# Patient Record
Sex: Female | Born: 1945
Health system: Southern US, Community
[De-identification: ages and names within clinical notes are randomized; demographics above are authoritative.]

## PROBLEM LIST (undated history)

## (undated) DIAGNOSIS — G971 Other reaction to spinal and lumbar puncture: Secondary | ICD-10-CM

## (undated) DIAGNOSIS — Z860100 Personal history of colon polyps, unspecified: Secondary | ICD-10-CM

## (undated) DIAGNOSIS — Z87442 Personal history of urinary calculi: Secondary | ICD-10-CM

## (undated) DIAGNOSIS — M1990 Unspecified osteoarthritis, unspecified site: Secondary | ICD-10-CM

## (undated) DIAGNOSIS — I639 Cerebral infarction, unspecified: Secondary | ICD-10-CM

## (undated) DIAGNOSIS — C50919 Malignant neoplasm of unspecified site of unspecified female breast: Secondary | ICD-10-CM

## (undated) DIAGNOSIS — I10 Essential (primary) hypertension: Secondary | ICD-10-CM

## (undated) DIAGNOSIS — D126 Benign neoplasm of colon, unspecified: Secondary | ICD-10-CM

## (undated) DIAGNOSIS — D649 Anemia, unspecified: Secondary | ICD-10-CM

## (undated) DIAGNOSIS — C182 Malignant neoplasm of ascending colon: Secondary | ICD-10-CM

## (undated) HISTORY — DX: Malignant neoplasm of ascending colon: C18.2

## (undated) HISTORY — DX: Personal history of urinary calculi: Z87.442

## (undated) HISTORY — DX: Cerebral infarction, unspecified: I63.9

## (undated) HISTORY — DX: Personal history of colon polyps, unspecified: Z86.0100

## (undated) HISTORY — DX: Unspecified osteoarthritis, unspecified site: M19.90

## (undated) HISTORY — DX: Benign neoplasm of colon, unspecified: D12.6

## (undated) HISTORY — DX: Malignant neoplasm of unspecified site of unspecified female breast: C50.919

## (undated) HISTORY — DX: Essential (primary) hypertension: I10

## (undated) HISTORY — PX: BREAST BIOPSY: SHX20

## (undated) HISTORY — DX: Personal history of colonic polyps: Z86.010

---

## 1978-11-30 HISTORY — PX: OVARIAN CYST REMOVAL: SHX89

## 1993-11-30 DIAGNOSIS — G971 Other reaction to spinal and lumbar puncture: Secondary | ICD-10-CM

## 1993-11-30 HISTORY — DX: Other reaction to spinal and lumbar puncture: G97.1

## 1993-11-30 HISTORY — PX: APPENDECTOMY: SHX54

## 2003-12-01 HISTORY — PX: HEEL SPUR EXCISION: SHX1733

## 2005-11-30 HISTORY — PX: PARTIAL COLECTOMY: SHX5273

## 2006-01-17 ENCOUNTER — Emergency Department: Payer: Self-pay | Admitting: Emergency Medicine

## 2006-04-22 ENCOUNTER — Ambulatory Visit: Payer: Self-pay | Admitting: Physician Assistant

## 2006-09-30 DIAGNOSIS — C182 Malignant neoplasm of ascending colon: Secondary | ICD-10-CM | POA: Insufficient documentation

## 2006-09-30 DIAGNOSIS — Z85038 Personal history of other malignant neoplasm of large intestine: Secondary | ICD-10-CM

## 2006-09-30 HISTORY — DX: Malignant neoplasm of ascending colon: C18.2

## 2006-10-03 ENCOUNTER — Inpatient Hospital Stay: Payer: Self-pay | Admitting: General Surgery

## 2006-10-06 ENCOUNTER — Other Ambulatory Visit: Payer: Self-pay

## 2006-11-08 ENCOUNTER — Ambulatory Visit: Payer: Self-pay | Admitting: Internal Medicine

## 2007-04-07 ENCOUNTER — Ambulatory Visit: Payer: Self-pay | Admitting: Family Medicine

## 2007-09-16 ENCOUNTER — Ambulatory Visit: Payer: Self-pay | Admitting: General Surgery

## 2007-09-23 IMAGING — CT CT ABD-PELV W/ CM
1 of 3 series · 14 of 32 positions shown, 19 images · non-contrast
Comparison: none

REASON FOR EXAM: (1) post op ileus; (2) same. IV CONTRAST ONLY.
COMMENTS:

PROCEDURE:     CT  - CT ABDOMEN / PELVIS  W  - October 15, 2006  [DATE]
RESULT:
REASON FOR CONSULTATION: Postoperative ileus for two weeks.
TECHNIQUE: Axial images were obtained from the hemidiaphragm to the pubic
symphysis post intravenous injection of contrast material.

[Series 2: abdomen · axial · 0.89mm/px · z∈[-494,-30]mm · 14 of 105 slices shown, 19 images]
[im 6/105  soft-tissue]
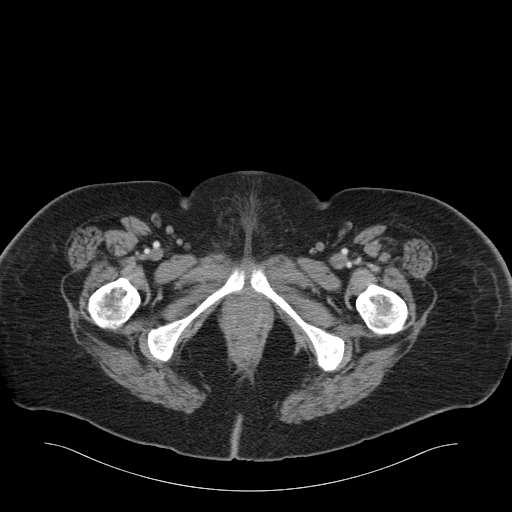
[im 6/105  bone]
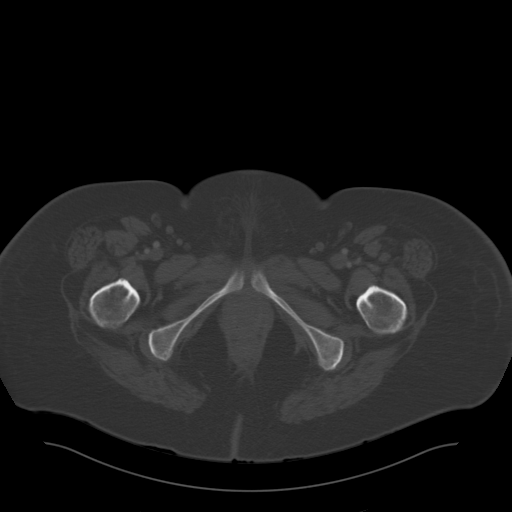
[im 17/105  soft-tissue]
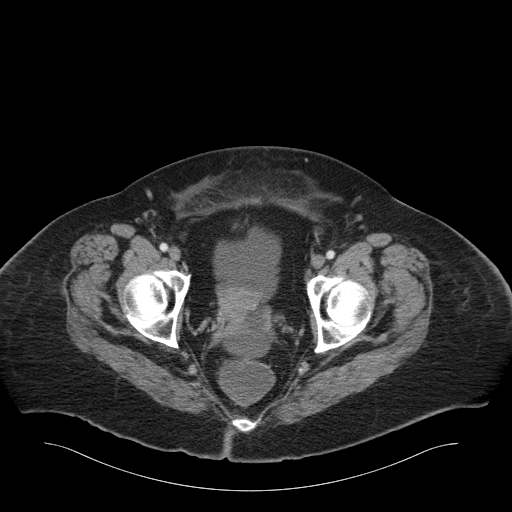
[im 22/105  soft-tissue]
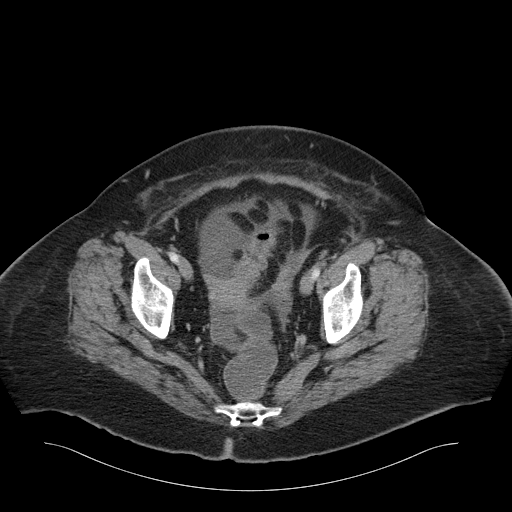
[im 28/105  soft-tissue]
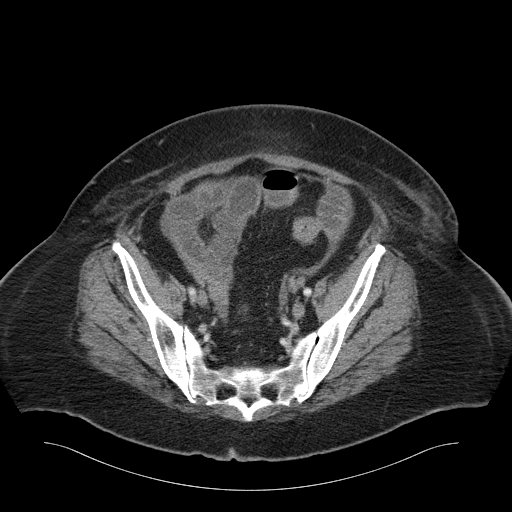
[im 39/105  soft-tissue]
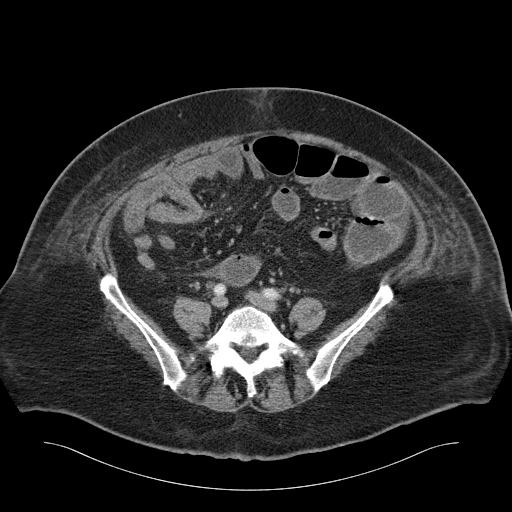
[im 44/105  soft-tissue]
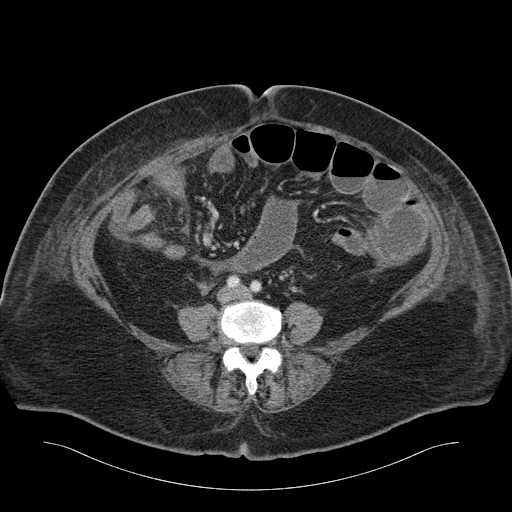
[im 55/105  soft-tissue]
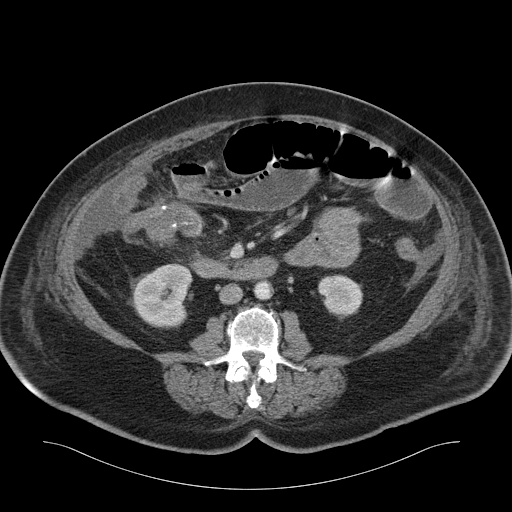
[im 61/105  soft-tissue]
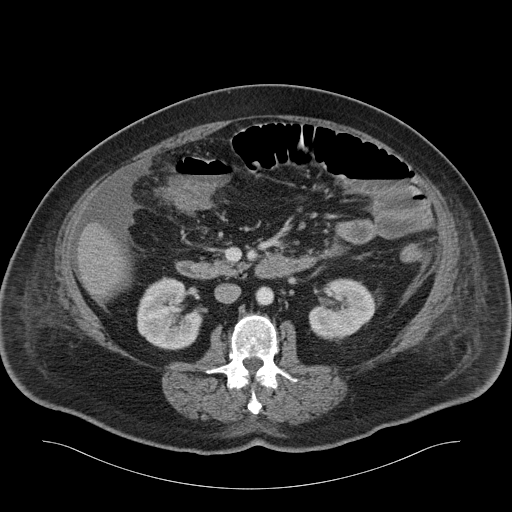
[im 66/105  soft-tissue]
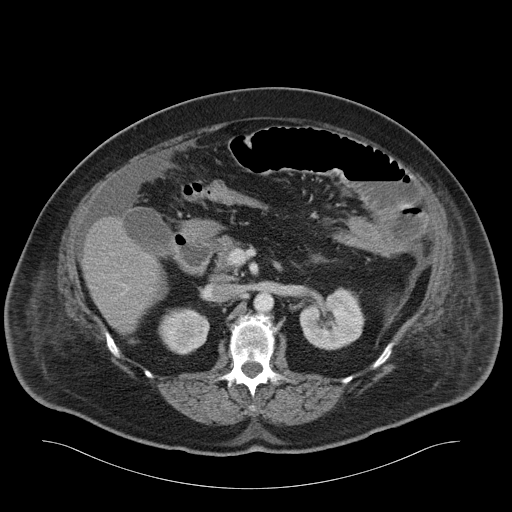
[im 66/105  bone]
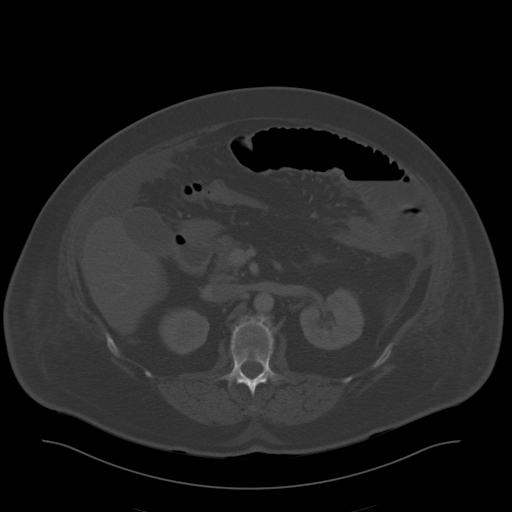
[im 77/105  soft-tissue]
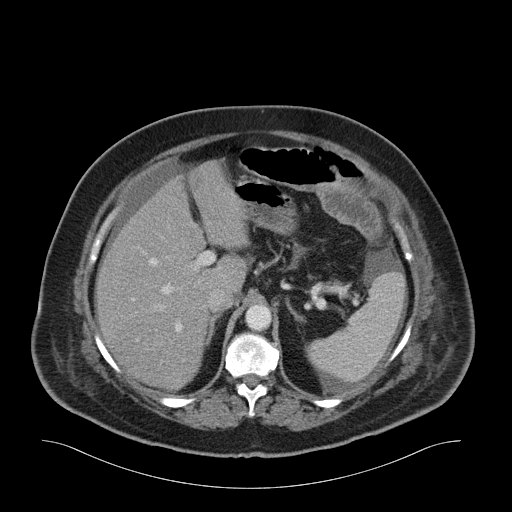
[im 83/105  soft-tissue]
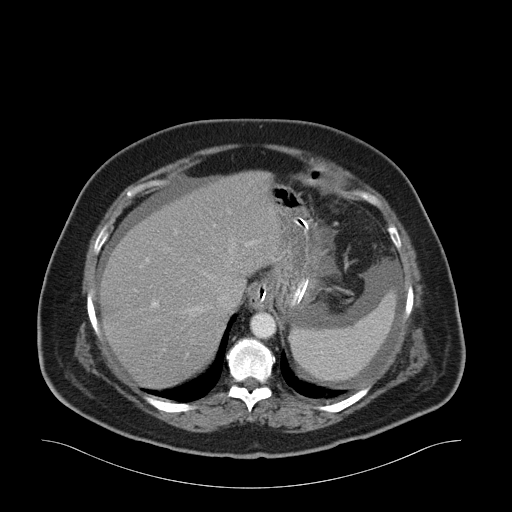
[im 83/105  lung]
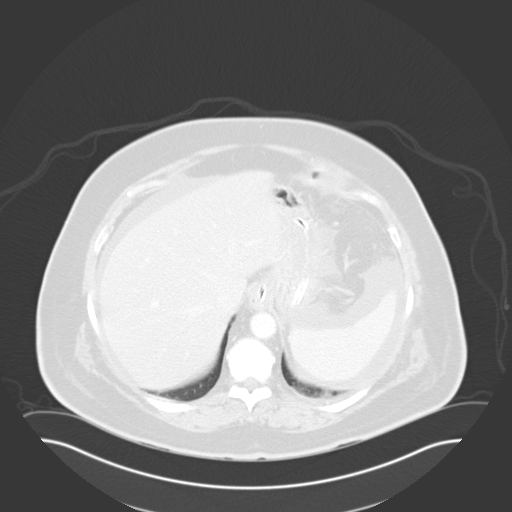
[im 88/105  soft-tissue]
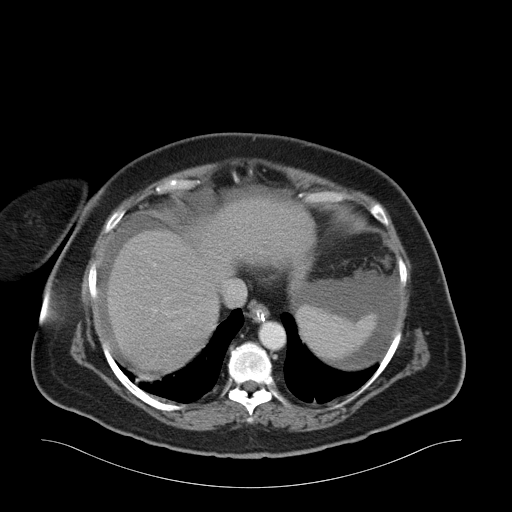
[im 88/105  lung]
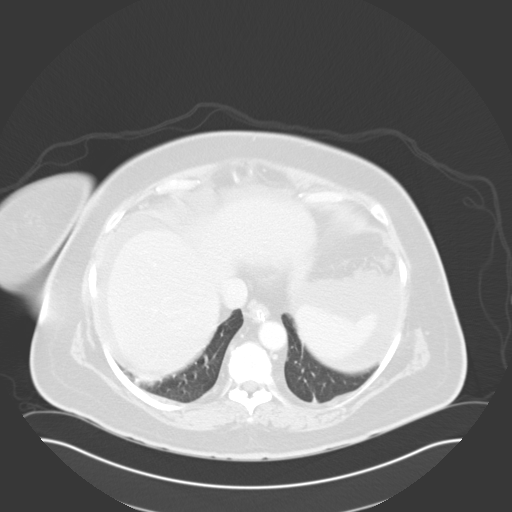
[im 94/105  lung]
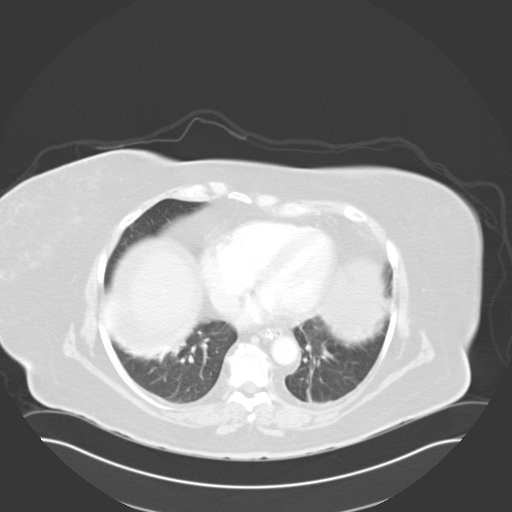
[im 99/105  soft-tissue]
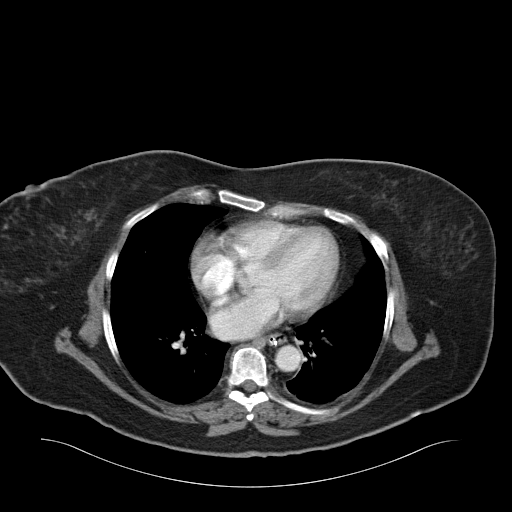
[im 99/105  lung]
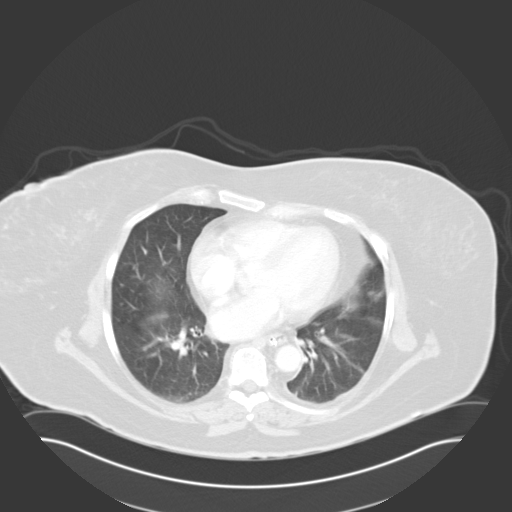

[14 of 32 positions shown; findings below may reference images not displayed]

FINDINGS: The liver and spleen appear intact. No focal masses are noted.
There is noted a small amount of ascites present about the liver and spleen
and colonic gutters. There is also noted some free fluid in the cul-de-sac.
Both kidneys are noted to excrete the contrast material.  There are noted
dilated loops of small bowel containing air and mostly fluid.  The colon is
decompressed and distally there is noted fluid in the sigmoid rectum region.
 A few of the distal small bowel loops do not appear distended.  There is
distention near the operative site and upper abdomen.  The possibility of
small bowel obstruction would be a consideration.
IMPRESSION: 1)Multiple dilated loops of small bowel.  There are a few loops distally,
which appear decompressed and there is dilatation proximal to the surgical
site. The possibility of small bowel obstruction would be a consideration.
There is noted ascites present.  No major organ abnormalities are
identified.

## 2007-09-24 IMAGING — CR DG ABDOMEN 2V
1 series · 4 of 4 positions shown · non-contrast
Comparison: none

REASON FOR EXAM: Follow up SBO.
COMMENTS:

[Series 1: view not recorded · 0.17mm/px · 4 of 4 slices shown]
[im 1/4]
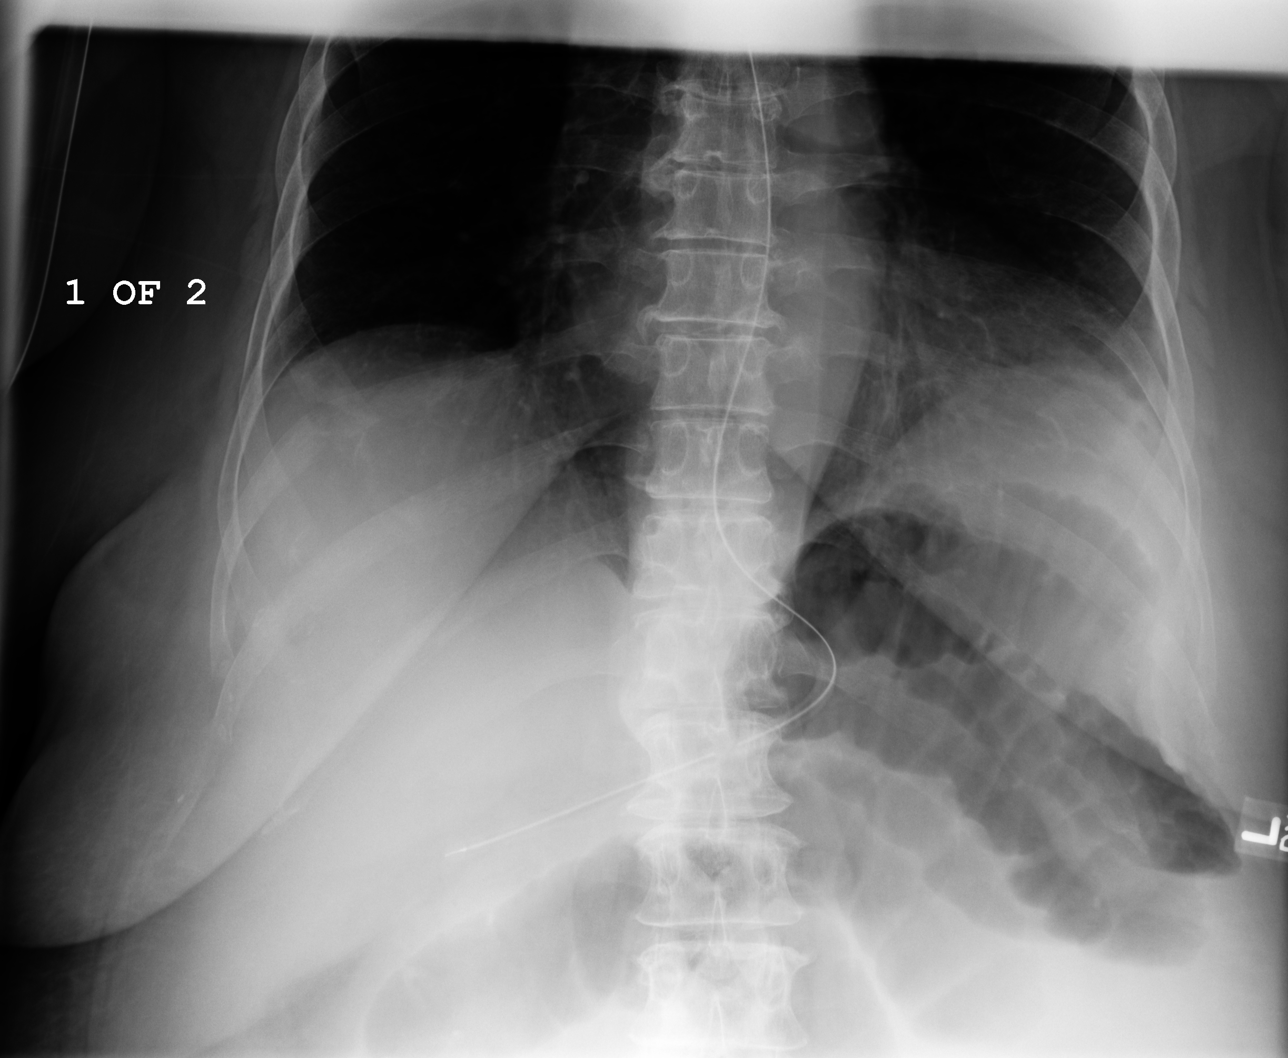
[im 2/4]
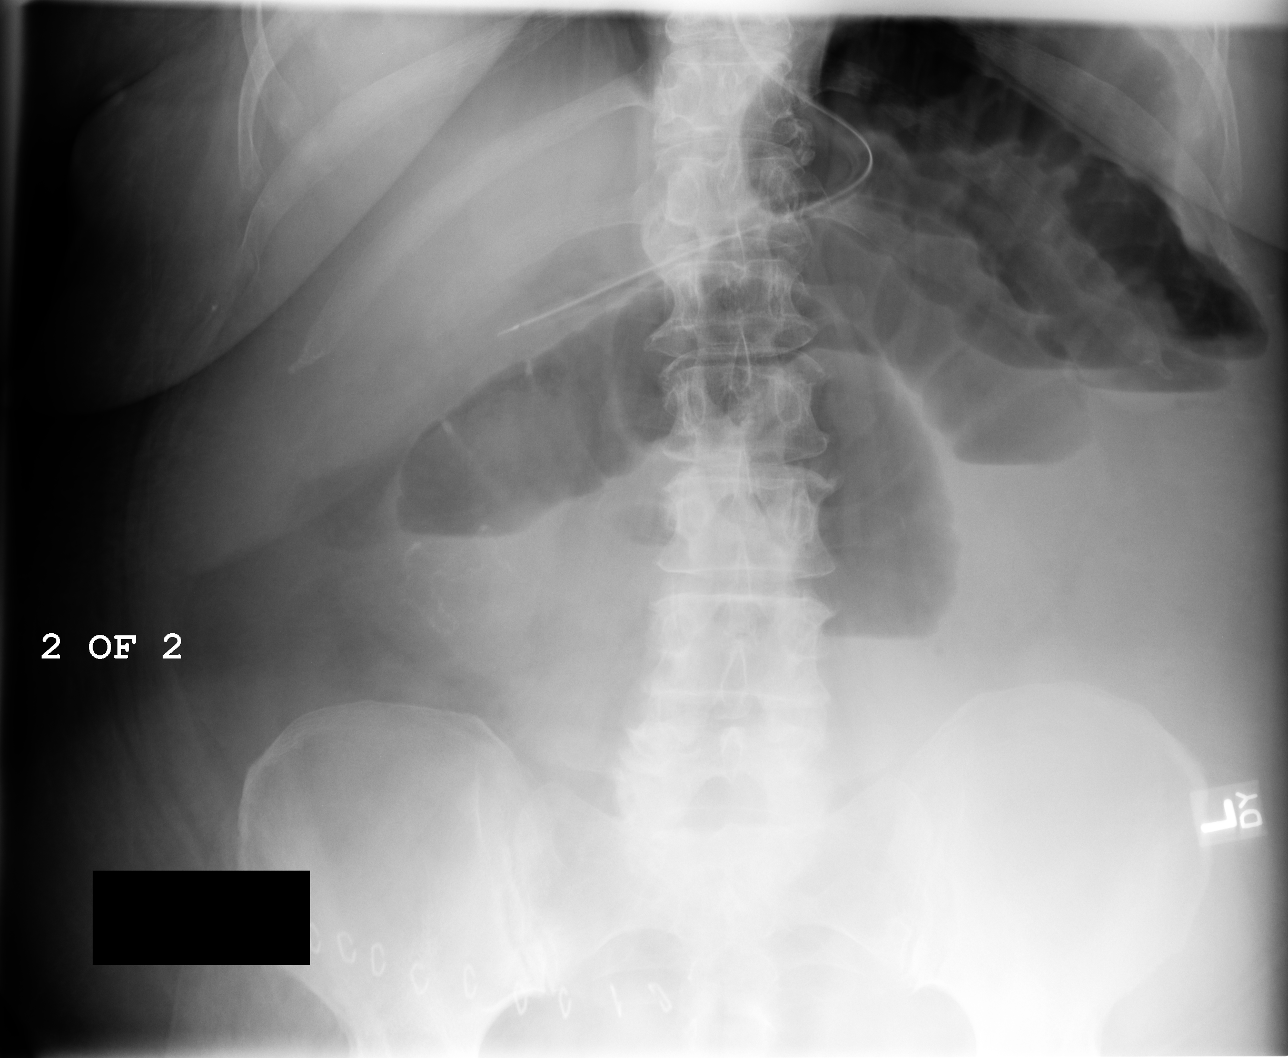
[im 3/4]
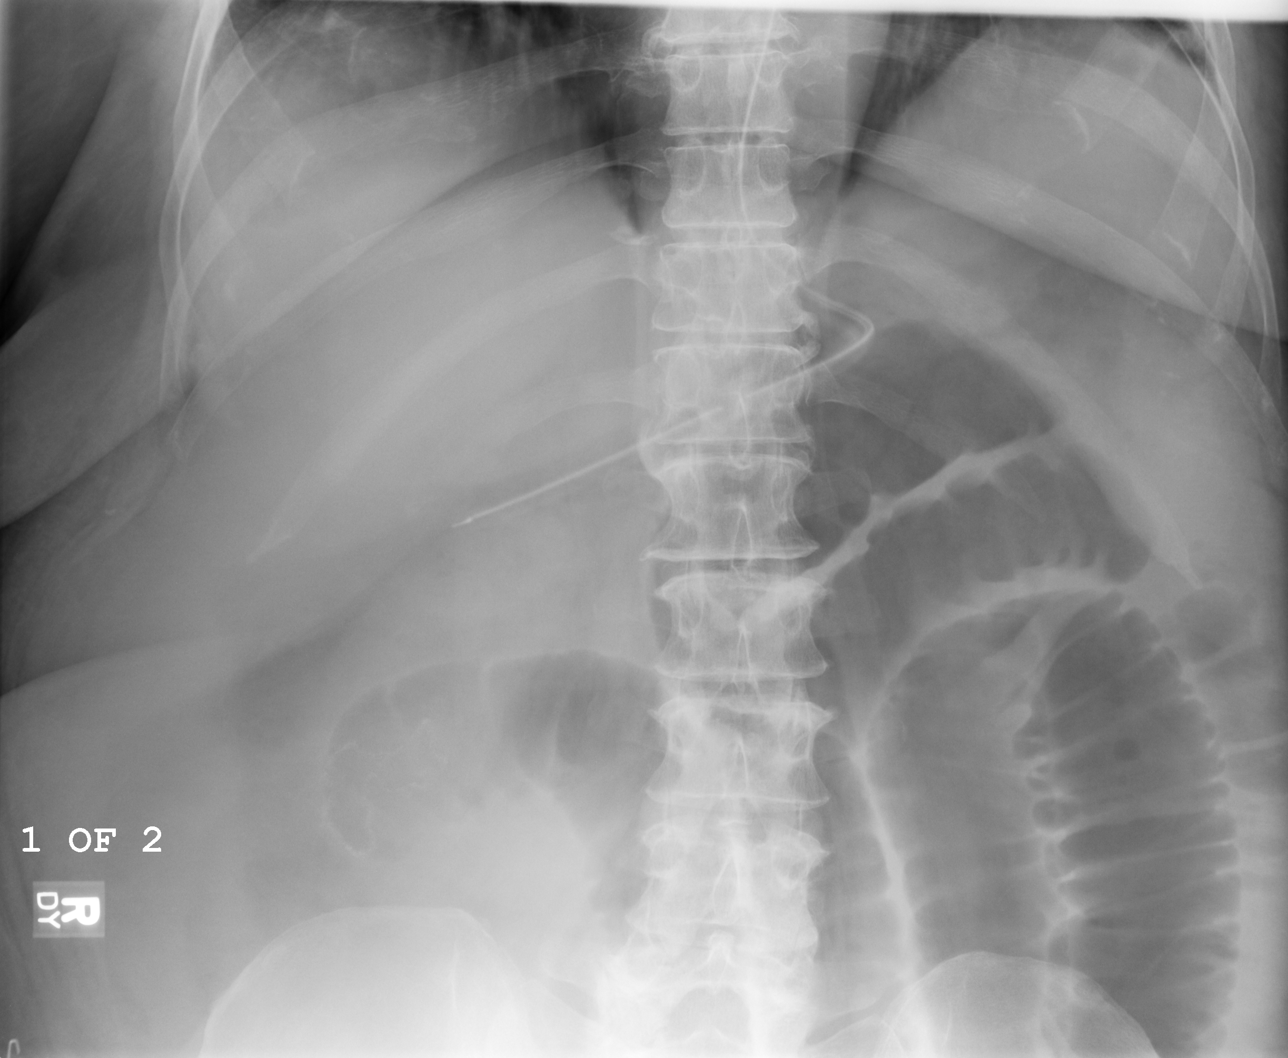
[im 4/4]
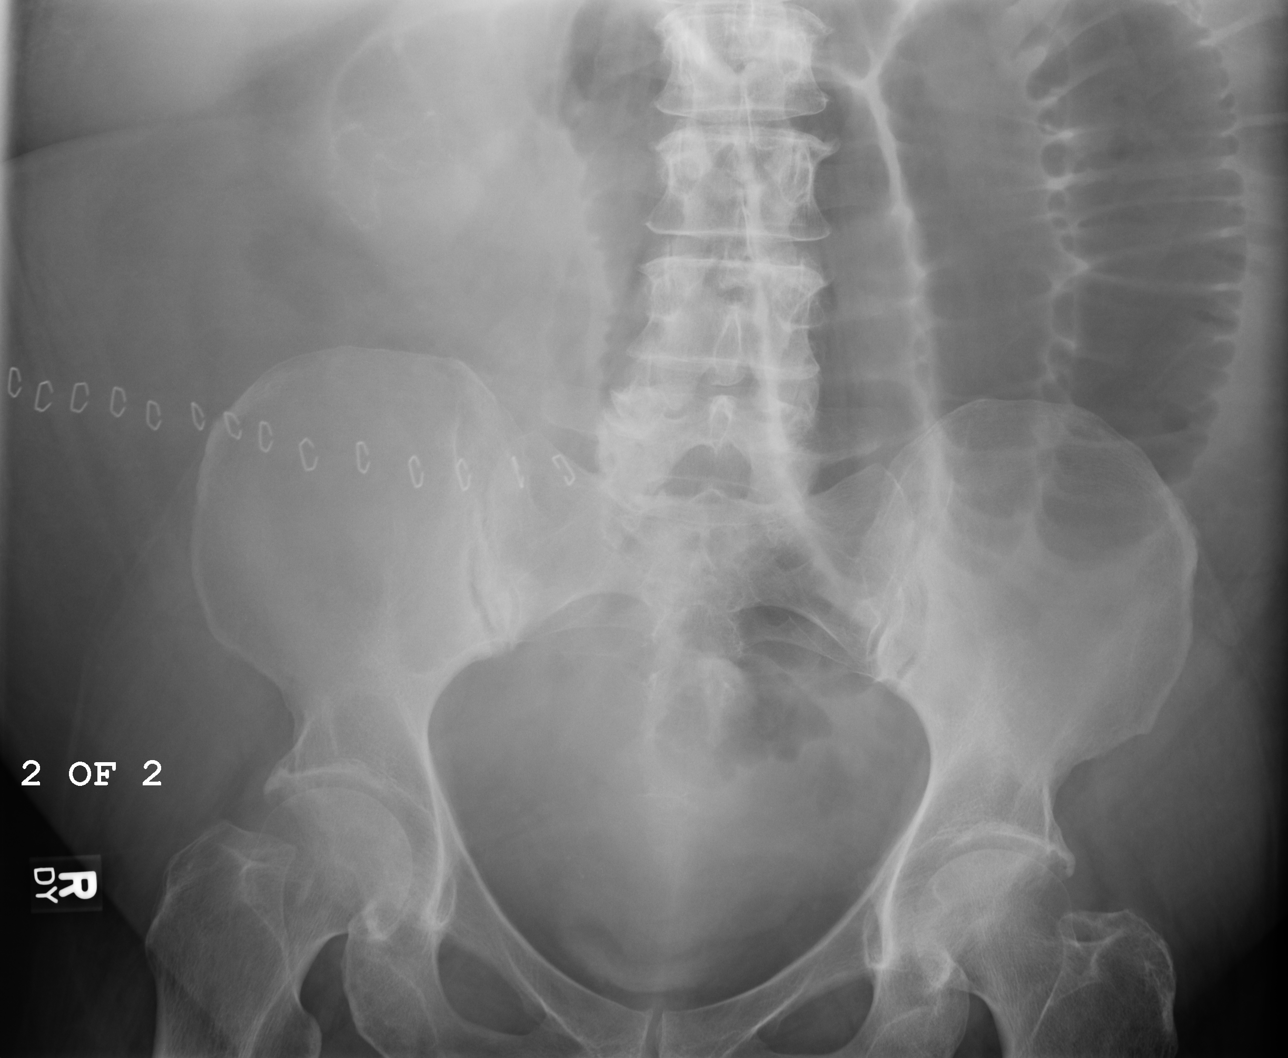

[4 of 4 positions shown; findings below may reference images not displayed]

PROCEDURE:     DXR - DXR ABDOMEN 2 V FLAT AND ERECT  - October 16, 2006  [DATE]

RESULT:        NG tube is noted in the stomach.  Dilated loops of small
bowel are noted. There is no free air.  There is no bowel wall thickening.
These findings are consistent with small bowel obstruction.  Surgical
staples are noted over the RIGHT lower quadrant.
IMPRESSION: Findings consistent with small bowel obstruction.  A postoperative ileus
cannot be completely excluded.  Similar findings were noted on a prior study
of 10/14/06.  There has been no improvement of the bowel dilatation.

## 2007-09-26 IMAGING — CR DG ABDOMEN 2V
1 series · 3 of 3 positions shown · non-contrast
Comparison: none

REASON FOR EXAM: f/u SBO
COMMENTS:

[Series 1: view not recorded · 0.17mm/px · 3 of 3 slices shown]
[im 1/3]
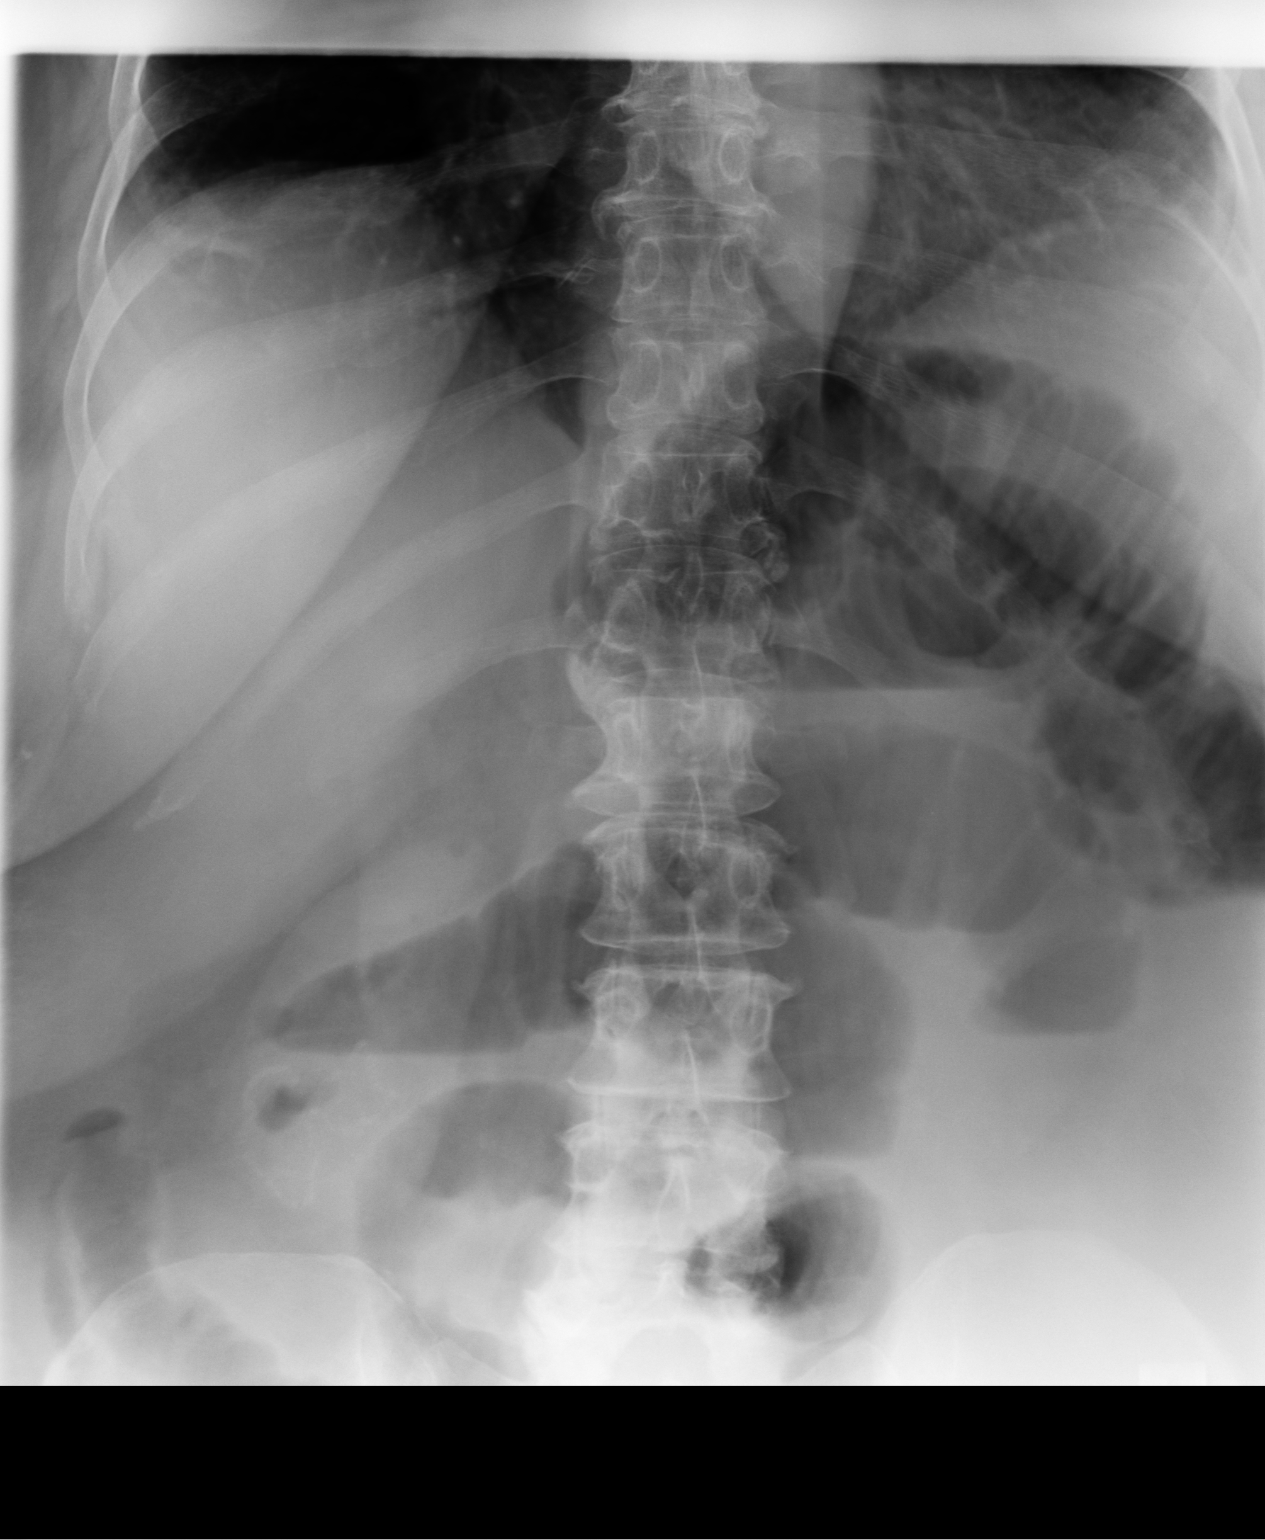
[im 2/3]
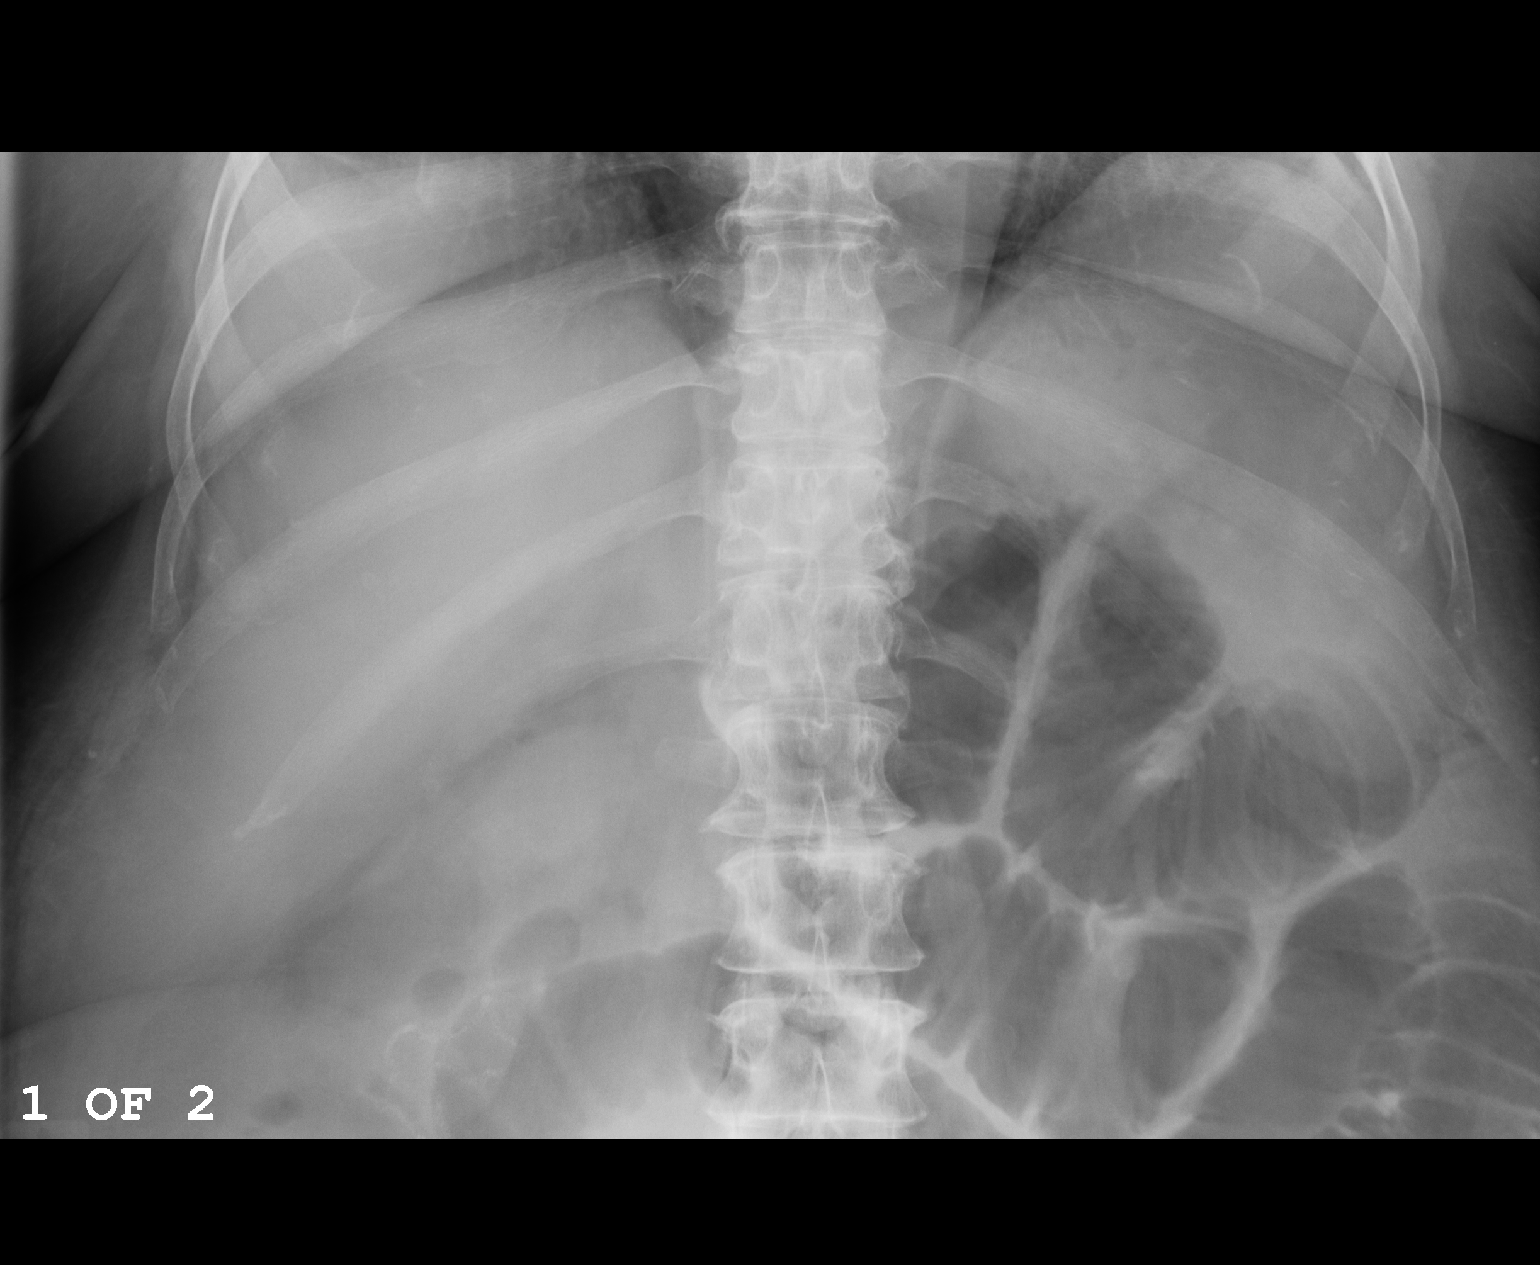
[im 3/3]
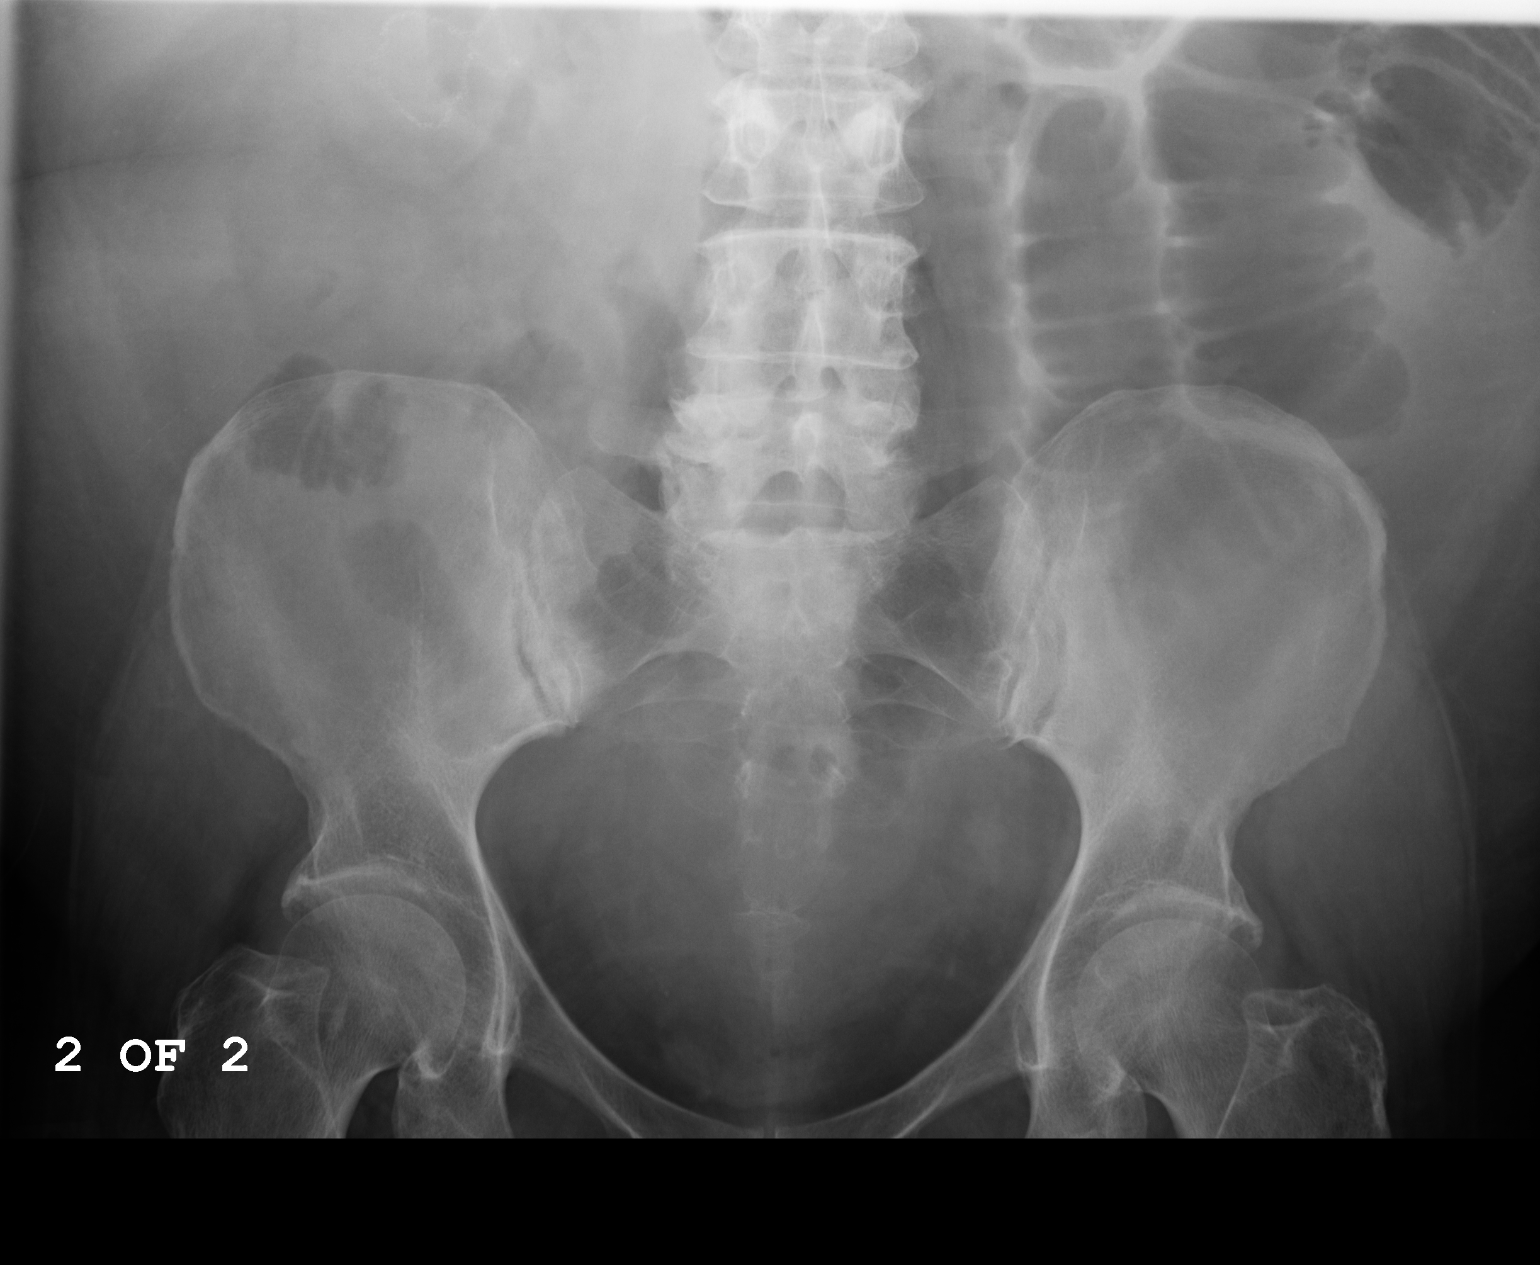

[3 of 3 positions shown; findings below may reference images not displayed]

PROCEDURE:     DXR - DXR ABDOMEN 2 V FLAT AND ERECT  - October 18, 2006  [DATE]

RESULT:     Dilated loops of small bowel are noted.  Similar findings were
noted on a prior study of 10/16/06.  On today's examination the NG has been
pulled.  The small bowel dilatation may have improved slightly from the
prior exam.  Surgical staples have been removed over the RIGHT abdomen.
IMPRESSION: Interim removal of NG tube and surgical staples.  The previously identified
small bowel dilatation remains and may be slightly improved on today's exam.
 There is a paucity of colonic gas, small bowel obstruction, as well as
adynamic ileus could present in this fashion.

## 2007-12-01 HISTORY — PX: LUMBAR DISC SURGERY: SHX700

## 2008-06-20 ENCOUNTER — Ambulatory Visit: Payer: Self-pay

## 2008-07-10 ENCOUNTER — Ambulatory Visit: Payer: Self-pay

## 2008-09-04 ENCOUNTER — Ambulatory Visit: Payer: Self-pay | Admitting: Family Medicine

## 2008-09-20 ENCOUNTER — Emergency Department: Payer: Self-pay | Admitting: Emergency Medicine

## 2008-09-25 ENCOUNTER — Ambulatory Visit: Payer: Self-pay | Admitting: Family Medicine

## 2008-09-27 ENCOUNTER — Inpatient Hospital Stay: Payer: Self-pay | Admitting: Internal Medicine

## 2009-06-26 ENCOUNTER — Ambulatory Visit: Payer: Self-pay

## 2010-07-15 ENCOUNTER — Ambulatory Visit: Payer: Self-pay

## 2010-07-18 ENCOUNTER — Ambulatory Visit: Payer: Self-pay

## 2010-08-07 HISTORY — PX: BREAST BIOPSY: SHX20

## 2010-09-17 ENCOUNTER — Inpatient Hospital Stay: Payer: Self-pay | Admitting: Internal Medicine

## 2010-09-17 DIAGNOSIS — I639 Cerebral infarction, unspecified: Secondary | ICD-10-CM

## 2010-09-17 DIAGNOSIS — Z8673 Personal history of transient ischemic attack (TIA), and cerebral infarction without residual deficits: Secondary | ICD-10-CM | POA: Insufficient documentation

## 2010-09-17 HISTORY — PX: DOPPLER ECHOCARDIOGRAPHY: SHX263

## 2010-09-17 HISTORY — DX: Cerebral infarction, unspecified: I63.9

## 2010-11-30 HISTORY — PX: JOINT REPLACEMENT: SHX530

## 2010-12-31 ENCOUNTER — Ambulatory Visit: Payer: Self-pay | Admitting: Unknown Physician Specialty

## 2010-12-31 HISTORY — PX: REPLACEMENT TOTAL KNEE: SUR1224

## 2011-01-06 ENCOUNTER — Inpatient Hospital Stay: Payer: Self-pay | Admitting: Unknown Physician Specialty

## 2011-01-29 ENCOUNTER — Observation Stay: Payer: Self-pay | Admitting: Internal Medicine

## 2011-02-14 ENCOUNTER — Emergency Department: Payer: Self-pay | Admitting: Emergency Medicine

## 2012-05-23 ENCOUNTER — Ambulatory Visit: Payer: Self-pay | Admitting: Family Medicine

## 2012-08-03 ENCOUNTER — Ambulatory Visit: Payer: Self-pay | Admitting: General Surgery

## 2012-08-03 HISTORY — PX: COLONOSCOPY: SHX174

## 2012-08-03 LAB — HM COLONOSCOPY

## 2012-11-30 DIAGNOSIS — C50919 Malignant neoplasm of unspecified site of unspecified female breast: Secondary | ICD-10-CM

## 2012-11-30 HISTORY — DX: Malignant neoplasm of unspecified site of unspecified female breast: C50.919

## 2012-12-06 ENCOUNTER — Ambulatory Visit: Payer: Self-pay | Admitting: General Surgery

## 2013-05-04 ENCOUNTER — Emergency Department: Payer: Self-pay | Admitting: Emergency Medicine

## 2013-05-05 LAB — CBC
HCT: 43.2 % (ref 35.0–47.0)
HGB: 14.8 g/dL (ref 12.0–16.0)
MCH: 29.9 pg (ref 26.0–34.0)
MCHC: 34.3 g/dL (ref 32.0–36.0)
MCV: 87 fL (ref 80–100)
Platelet: 175 10*3/uL (ref 150–440)
WBC: 13.8 10*3/uL — ABNORMAL HIGH (ref 3.6–11.0)

## 2013-05-05 LAB — URINALYSIS, COMPLETE
Bilirubin,UR: NEGATIVE
Blood: NEGATIVE
Leukocyte Esterase: NEGATIVE
Nitrite: NEGATIVE
WBC UR: 3 /HPF (ref 0–5)

## 2013-05-05 LAB — COMPREHENSIVE METABOLIC PANEL
Albumin: 3.8 g/dL (ref 3.4–5.0)
Alkaline Phosphatase: 132 U/L (ref 50–136)
BUN: 15 mg/dL (ref 7–18)
Chloride: 106 mmol/L (ref 98–107)
Co2: 27 mmol/L (ref 21–32)
EGFR (African American): 59 — ABNORMAL LOW
EGFR (Non-African Amer.): 51 — ABNORMAL LOW
Glucose: 160 mg/dL — ABNORMAL HIGH (ref 65–99)
Osmolality: 287 (ref 275–301)
Potassium: 3.3 mmol/L — ABNORMAL LOW (ref 3.5–5.1)
SGOT(AST): 28 U/L (ref 15–37)
SGPT (ALT): 21 U/L (ref 12–78)
Sodium: 142 mmol/L (ref 136–145)

## 2013-05-05 LAB — LIPASE, BLOOD: Lipase: 127 U/L (ref 73–393)

## 2013-06-09 ENCOUNTER — Encounter: Payer: Self-pay | Admitting: *Deleted

## 2013-06-26 ENCOUNTER — Ambulatory Visit: Payer: Self-pay | Admitting: Family Medicine

## 2013-07-06 ENCOUNTER — Ambulatory Visit: Payer: Self-pay | Admitting: Family Medicine

## 2013-07-10 ENCOUNTER — Encounter: Payer: Self-pay | Admitting: *Deleted

## 2013-07-26 ENCOUNTER — Ambulatory Visit (INDEPENDENT_AMBULATORY_CARE_PROVIDER_SITE_OTHER): Payer: Medicare Other | Admitting: General Surgery

## 2013-07-26 ENCOUNTER — Encounter: Payer: Self-pay | Admitting: General Surgery

## 2013-07-26 VITALS — BP 120/82 | HR 70 | Resp 14 | Ht 66.0 in | Wt 228.0 lb

## 2013-07-26 DIAGNOSIS — R920 Mammographic microcalcification found on diagnostic imaging of breast: Secondary | ICD-10-CM

## 2013-07-26 NOTE — Patient Instructions (Addendum)
Patient to be scheduled for a left breast stereotactic biopsy.   Breast Biopsy, Stereotactic A stereotactic breast biopsy takes a tissue sample from the breast with a special instrument. This is done when:  The problem (lump, abnormality, mass) can be seen on X-ray, but not felt on physical exam.  Suspicious, small calcium deposits (calcifications) are seen in the breast.  There is a change in shape or appearance of the breast, thickening, or asymmetry on mammogram (breast X-ray).  You have nipple changes (unusual or bloody discharge, crusting, retraction, dimpling).  Your caregiver is making a surgical diagnosis. The biopsy may be done on a special table, with your face down and your breasts placed through openings in the table. Computerized imaging (special form of X-rays) is used. The images are not obtained using regular X-ray film. So, exposure to radiation is reduced. Images are seen through several different angles. The surgeon removes small pieces of the suspicious tissue through a hollow needle. The tissue will be sent to the lab for analysis. The surgeon can look at the pictures right away, rather than wait for an X-ray to be developed. Your caregiver can mark the lesions (abnormal tissue formations) electronically. Then the computer can tell exactly where the problem is, or if it has moved. BENEFITS OF THE PROCEDURE  This is a good way to see if tiny lumps, abnormal looking tissue, or calcium deposits that you cannot feel are cancerous or require further treatment or follow-up.  Needle biopsy is a simple procedure. It may be performed in an outpatient imaging center. This means you have the procedure and go home the same day, without checking into a hospital.  It is less painful than open surgery. The results are as accurate as when a tissue sample is removed surgically.  The procedure is faster, less expensive, less invasive, does not distort the breast, and leaves little or no  scar.  Breast defects, which can make future mammograms hard to read and interpret, do not remain.  Recovery time is brief. Patients can soon resume their normal activities.  Using VAD (vacuum assisted device) may make it possible to remove entire lesions.  A breast biopsy can indicate if you need surgery, other treatment, or combined treatment. LET YOUR CAREGIVER KNOW ABOUT:  Allergies.  Medications taken, including herbs, eye drops, over-the-counter medications, and creams.  Use of steroids (by mouth or creams).  Previous problems with anesthetics or numbing medication.  If you are taking blood thinner medications or aspirin.  Possibility of pregnancy, if this applies.  History of blood clots (thrombophlebitis).  History of bleeding or blood problems.  Previous surgery.  Other health problems. RISKS AND COMPLICATIONS  Infection (germ growing in the wound). This can often be treated with antibiotics.  Bleeding, following surgery. Your surgeon takes every precaution to keep this from happening.  There is some concern that if a cancerous mass is present, cancer cells might be spread by the needle. Whether this actually happens is not known. It does not appear to be a significant risk.  X-ray guided breast biopsy is not infallible (not always correct). The problem may be missed or the extent of the problem may be underestimated. This would mean the biopsy did not manage to remove a piece of the diseased tissue or enough of the diseased tissue.  Lesions present, with calcium deposits scattered throughout the breast, are difficult to target by stereotactic method. Those lesions near the chest wall also are hard to learn about by this  method. If the mammogram shows only a vague change in tissue density, but no definite mass or nodule, the X-ray guided method may not be successful. Occasionally, even after a successful biopsy, the tissue diagnosis remains uncertain. A surgical  biopsy will be needed, if abnormal or precancerous cells are found on core biopsy.  Altering or deforming of the breast.  Unable to find, or missing the lesion.  Rarely, the needle may go through the chest wall into the lung area. TWO BIOPSY INSTRUMENTS MAY BE USED IN THE PROCEDURE The conventional biopsy device (core needle biopsy device) consists of an inner needle with a trough extending from it at one end, and an overlying sheath. It is attached to a spring-loaded mechanism that propels it forward. The trough fills with tissue. The outer sheath instantly moves forward to cut the tissue and keep it in the trough. Each sample is obtained in a fraction of a second. It is necessary to withdraw the needle after each sample is taken to collect the tissue.  A newer type of instrument, the VAD (vacuum assisted device), uses vacuum pressure to pull breast tissue into a needle and remove it. The needle does not need to be withdrawn after each sampling. Another advantage is that biopsies are obtained in an orderly manner, by rotating the device. This helps make sure that the entire area of interest will be sampled. When using the automated core biopsy needle, sampling is more random.  FOR COMFORT DURING THE TEST  Relax as much as possible.  Try to follow instructions, to speed up the test.  Let your caregiver know if you are uncomfortable, anxious, or in pain. PROCEDURE  You are awake during the procedure, and you go home the same day (outpatient). A specially trained radiologist will do this procedure. First, the skin is cleansed. Then, it is injected with a local anesthetic. A small nick is made in the skin, and the tip of the biopsy needle is put into the calculated site of the lesion. A special mammography machine uses ionizing radiation to help guide the radiologist's instrument to the site of the abnormal growth. At this point, stereo images are again obtained, to confirm that the needle tip is at  the problem area. Usually 5 to 10 samples are collected when doing a core biopsy. At least 12 are collected when using the vacuum assisted device (VAD). Then, a final set of images is obtained. If they show that the lesion has been mostly or completely removed, a small clip is left at the biopsy site. This is so that it can be easily located, in case the lesion turns out to be cancer. Afterward, the skin opening is stitched (sutured) or taped closed, and covered with a dressing. Your caregiver may apply a pressure dressing and an ice pack, to prevent bleeding and swelling in the breast.  X-ray guided breast biopsy can take 30 minutes to 1 hour, or more. The X-rays usually have no side effects, and no radiation remains in your body. There is usually little or no pain. Usually no scar is left from the tiny skin incision. Many women find that the major discomfort of the procedure is from lying on their stomach, or staying in 1 position for the length of the procedure. This discomfort may be reduced by carefully placed cushions. You should wear a good support bra to the procedure. You will be asked to remove jewelry, dentures, eye glasses, metal objects, or clothing that might interfere with  the X-ray images. You may want to have someone with you, to take you home after the procedure. AFTER THE PROCEDURE   After surgery, if you are doing well and have no problems, you will be allowed to go home.  You may resume your regular diet, or as directed by your caregiver. HOME CARE INSTRUCTIONS   Follow your caregiver's recommendations for medications, care of the biopsy site, follow-up appointments, and further treatment.  Only take over-the-counter or prescription medicines for pain, discomfort, or fever as directed by your caregiver.  An ice pack applied to the affected area may help with discomfort and keep the swelling down.  Change dressings as directed.  Wear a good support bra for as long as your  caregiver recommends.  Avoid strenuous activity for at least 24 hours, or as advised by your caregiver. Finding out the results of your test Not all test results are available during your visit. If your test results are not back during the visit, make an appointment with your caregiver to find out the results. Do not assume everything is normal if you have not heard from your caregiver or the medical facility. It is important for you to follow up on all of your test results.  SEEK MEDICAL CARE IF:   You develop a rash.  You have problems with your medicines.  You become lightheaded or dizzy. SEEK IMMEDIATE MEDICAL CARE IF:   There is increased bleeding (more than a small spot) from the biopsy site.  You notice redness, swelling, or increasing pain in the wound.  Pus is coming from the wound.  You have a fever.  You notice a bad smell coming from the wound or dressing.  You develop shortness of breath.  You develop chest pain.  You pass out. Document Released: 08/15/2003 Document Revised: 02/08/2012 Document Reviewed: 09/20/2009 [facility] Patient Information 2014 [place], [place].  Patient has been scheduled for a left stereotactic biopsy x 2 at [facility] for 08-14-13 at 3 pm. She will check-in at the [facility] at 2:30 pm. This patient is aware of date, time, and instructions. Patient verbalizes understanding.

## 2013-07-26 NOTE — Progress Notes (Signed)
Patient ID: Lindsay Russo, female   DOB: June 11, 1946, 67 y.o.   MRN: [id number]  Chief Complaint  Patient presents with  . Other    mammogram    HPI Lindsay Russo is a 67 y.o. female who presents for a breast evaluation. The most recent mammogram was done on 06/26/13 with a birad category 0. Additional views of the left breast were done on 07/06/13 with a birad category 4. The patient performs self breast checks regularly and gets regular mammograms done. The patient denies any problems with the breasts at this time. No known family history of breast problems.   The patient previously underwent biopsy of a sclerotic right breast fibroadenoma 2011.   HPI  Past Medical History  Diagnosis Date  . Hypertension   . Stroke 2011  . Arthritis   . Malignant neoplasm of ascending colon   . Renal failure   . Personal history of colonic polyps     Past Surgical History  Procedure Laterality Date  . Appendectomy  1995  . Ovarian cyst removal  1980  . Colon surgery  2007  . Colonoscopy  2008  . Replacement total knee  2012  . Breast biopsy Right 2011    pathology was negative    Family History  Problem Relation Age of Onset  . Gastric cancer Father     Social History History  Substance Use Topics  . Smoking status: Never Smoker   . Smokeless tobacco: Never Used  . Alcohol Use: No    Allergies  Allergen Reactions  . Amoxicillin Rash  . Sulfa Antibiotics Rash    Current Outpatient Prescriptions  Medication Sig Dispense Refill  . amLODipine (NORVASC) 10 MG tablet Take 1 tablet by mouth daily.      [name] aspirin 81 MG tablet Take 81 mg by mouth daily.      . pravastatin (PRAVACHOL) 80 MG tablet Take 1 tablet by mouth.       No current facility-administered medications for this visit.    Review of Systems Review of Systems  Constitutional: Negative.   Respiratory: Negative.   Cardiovascular: Negative.     Blood pressure 120/82, pulse 70, resp. rate 14, height 5\' 6"  (1.676  m), weight 228 lb (103.42 kg).  Physical Exam Physical Exam  Constitutional: She is oriented to person, place, and time. She appears well-developed and well-nourished.  Neck: No thyromegaly present.  Cardiovascular: Normal rate, regular rhythm and normal heart sounds.   No murmur heard. Pulmonary/Chest: Effort normal and breath sounds normal. Right breast exhibits no inverted nipple, no mass, no nipple discharge, no skin change and no tenderness. Left breast exhibits no inverted nipple, no mass, no nipple discharge, no skin change and no tenderness.  Right breast 2 cup sizes larger than the left.   Lymphadenopathy:    She has no cervical adenopathy.    She has no axillary adenopathy.  Neurological: She is alert and oriented to person, place, and time.  Skin: Skin is warm and dry.    Data Reviewed Bilateral mammograms dated June 26, 2013 suggested a new area of concern in the left breast. The right breast was unremarkable. BI-RAD-0.  Focal spot compression views of the left breast in July 06, 2013 showed 2 groupings of indeterminate microcalcifications. BI-RAD-4B.  Hemoglobin A1c dated June 22, 2013: 5.5%. Recent As a metabolic panel showed a creatinine of 1.0 with an estimated GFR 57, normal electrolytes.  Assessment    New left breast microcalcifications.  Plan    Indication rebiopsy was reviewed with the patient. The dominant lesion will be targeted first, end of the procedure goes as well as anticipated, both areas will be removed.     Patient has been scheduled for a left stereotactic biopsy x 2 at [facility] for 08-14-13 at 3 pm. She will check-in at the [facility] at 2:30 pm. This patient is aware of date, time, and instructions. Patient verbalizes understanding.   Lindsay Russo 07/27/2013, 7:06 PM

## 2013-07-27 ENCOUNTER — Encounter: Payer: Self-pay | Admitting: General Surgery

## 2013-08-03 ENCOUNTER — Encounter: Payer: Self-pay | Admitting: General Surgery

## 2013-08-14 ENCOUNTER — Ambulatory Visit: Payer: Self-pay | Admitting: General Surgery

## 2013-08-14 DIAGNOSIS — R920 Mammographic microcalcification found on diagnostic imaging of breast: Secondary | ICD-10-CM

## 2013-08-14 HISTORY — PX: BREAST BIOPSY: SHX20

## 2013-08-16 ENCOUNTER — Telehealth: Payer: Self-pay | Admitting: General Surgery

## 2013-08-16 NOTE — Telephone Encounter (Signed)
Notified of biopsy results. Small foci of DCIS. Will meet Monday, Sept 22, to review options.

## 2013-08-17 ENCOUNTER — Encounter: Payer: Self-pay | Admitting: General Surgery

## 2013-08-21 ENCOUNTER — Ambulatory Visit (INDEPENDENT_AMBULATORY_CARE_PROVIDER_SITE_OTHER): Payer: Medicare Other | Admitting: General Surgery

## 2013-08-21 VITALS — BP 144/76 | HR 76 | Resp 14 | Ht 66.0 in | Wt 228.0 lb

## 2013-08-21 DIAGNOSIS — C50919 Malignant neoplasm of unspecified site of unspecified female breast: Secondary | ICD-10-CM

## 2013-08-21 DIAGNOSIS — D0590 Unspecified type of carcinoma in situ of unspecified breast: Secondary | ICD-10-CM

## 2013-08-21 DIAGNOSIS — D0512 Intraductal carcinoma in situ of left breast: Secondary | ICD-10-CM

## 2013-08-21 NOTE — Progress Notes (Signed)
Patient ID: Lindsay Russo, female   DOB: 08-28-1946, 67 y.o.   MRN: [id number]  The patient had been contacted by phone with the results of her breast biopsies completed on 08/14/2013. The superior lesion did show an area of DCIS. She is accompanied today by her husband and 2 daughters.  Examination of head and neck is unremarkable. Chest exam is clear. Cardiac exam purulent.  Examination of the left breast showed moderate bruising of the lateral aspect.  Ultrasound examination confirmed both biopsy sites.   The superior site is at the 12:00 position measuring 0.8 x 1.7 x 1.95 cm. This is located 6 cm superior to the nipple.  4 cm from the nipple a hypoechoic area measuring 0.8 x 0.8 x 0.9 cm is identified as well.  The lateral biopsy site located at the 3:00 position 6 cm from the nipple measured 1.2 x 1.4 x 2.0 cm.  Pathology of the lateral site showed no evidence of malignancy.  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 A fibroadenoma with coarse calcifications was also appreciated. The pathologist commented that pleomorphic LCIS has behavior pattern similar to DCIS. There is an increased risk of multifocality.  Options for management were reviewed: 1) simple mastectomy versus 2) wide local excision followed by postoperative radiation therapy. At this time the patient is interested in breast conservation. In light of the findings of LCIS, arrangements were made for a bilateral breast MRI prior to surgical intervention.  Patient to be scheduled for a bilateral breast MRI at [facility]. This has been arranged for 08-25-13 at 10 am.   Surgery to be arranged after breast MRI.

## 2013-08-21 NOTE — Patient Instructions (Signed)
Patient to be scheduled for a breast MRI.

## 2013-08-22 ENCOUNTER — Encounter: Payer: Self-pay | Admitting: General Surgery

## 2013-08-22 DIAGNOSIS — D0510 Intraductal carcinoma in situ of unspecified breast: Secondary | ICD-10-CM | POA: Insufficient documentation

## 2013-08-25 ENCOUNTER — Ambulatory Visit
Admission: RE | Admit: 2013-08-25 | Discharge: 2013-08-25 | Disposition: A | Discharge status: Home / Self Care | Payer: Medicare Other | Source: Ambulatory Visit | Attending: General Surgery | Admitting: General Surgery

## 2013-08-25 DIAGNOSIS — C50919 Malignant neoplasm of unspecified site of unspecified female breast: Secondary | ICD-10-CM

## 2013-08-25 MED ORDER — GADOBENATE DIMEGLUMINE 529 MG/ML IV SOLN
20.0000 mL | Freq: Once | INTRAVENOUS | Status: AC | PRN
  Administered 2013-08-25: 20 mL via INTRAVENOUS

## 2013-08-29 ENCOUNTER — Other Ambulatory Visit: Payer: Self-pay | Admitting: General Surgery

## 2013-08-29 ENCOUNTER — Telehealth: Payer: Self-pay | Admitting: *Deleted

## 2013-08-29 DIAGNOSIS — D0512 Intraductal carcinoma in situ of left breast: Secondary | ICD-10-CM

## 2013-08-29 NOTE — Telephone Encounter (Signed)
Message copied by Nicholes Mango on Tue Aug 29, 2013 12:11 PM ------      Message from: [name]      Created: Tue Aug 29, 2013  8:14 AM       May proceed with scheduling. Notify patient no additional abnormalities appreciated on breast MRI. Thanks.      ----- Message -----         From: Wendall Stade, CMA         Sent: 08/25/2013  10:09 AM           To: [name], MD            Reminder: Patient is scheduled for breast MRI today, 08-25-13 at 10 am. Let me know when I can arrange patient's wide excision. Thanks.       ------

## 2013-08-29 NOTE — Telephone Encounter (Signed)
Patient notified as instructed. This patient's surgery has been scheduled for 09-05-13 at [facility] [place]. It is okay for patient to continue 81 mg aspirin.

## 2013-08-30 ENCOUNTER — Ambulatory Visit: Payer: Self-pay | Admitting: General Surgery

## 2013-09-05 ENCOUNTER — Ambulatory Visit: Payer: Self-pay | Admitting: General Surgery

## 2013-09-05 DIAGNOSIS — C50919 Malignant neoplasm of unspecified site of unspecified female breast: Secondary | ICD-10-CM

## 2013-09-05 DIAGNOSIS — C50219 Malignant neoplasm of upper-inner quadrant of unspecified female breast: Secondary | ICD-10-CM

## 2013-09-05 HISTORY — PX: BREAST LUMPECTOMY: SHX2

## 2013-09-05 HISTORY — PX: BREAST SURGERY: SHX581

## 2013-09-05 HISTORY — DX: Malignant neoplasm of unspecified site of unspecified female breast: C50.919

## 2013-09-06 ENCOUNTER — Encounter: Payer: Self-pay | Admitting: General Surgery

## 2013-09-07 ENCOUNTER — Telehealth: Payer: Self-pay | Admitting: General Surgery

## 2013-09-07 LAB — PATHOLOGY REPORT

## 2013-09-07 NOTE — Telephone Encounter (Signed)
The patient was notified that the wide excision specimen showed clear margins.  She reports feeling well.  Followup office exam is scheduled for 09/12/2013.

## 2013-09-11 ENCOUNTER — Encounter: Payer: Self-pay | Admitting: General Surgery

## 2013-09-12 ENCOUNTER — Encounter: Payer: Self-pay | Admitting: General Surgery

## 2013-09-12 ENCOUNTER — Ambulatory Visit (INDEPENDENT_AMBULATORY_CARE_PROVIDER_SITE_OTHER): Payer: Medicare Other | Admitting: General Surgery

## 2013-09-12 VITALS — BP 128/80 | HR 78 | Resp 14 | Ht 66.0 in | Wt 229.0 lb

## 2013-09-12 DIAGNOSIS — C50919 Malignant neoplasm of unspecified site of unspecified female breast: Secondary | ICD-10-CM

## 2013-09-12 DIAGNOSIS — D0500 Lobular carcinoma in situ of unspecified breast: Secondary | ICD-10-CM | POA: Insufficient documentation

## 2013-09-12 DIAGNOSIS — D0590 Unspecified type of carcinoma in situ of unspecified breast: Secondary | ICD-10-CM

## 2013-09-12 DIAGNOSIS — D0502 Lobular carcinoma in situ of left breast: Secondary | ICD-10-CM

## 2013-09-12 DIAGNOSIS — D0512 Intraductal carcinoma in situ of left breast: Secondary | ICD-10-CM

## 2013-09-12 NOTE — Patient Instructions (Addendum)
Patient to return in 2 weeks  

## 2013-09-12 NOTE — Progress Notes (Signed)
Patient ID: Lindsay Russo, female   DOB: 1946-11-05, 67 y.o.   MRN: [id number]  Chief Complaint  Patient presents with  . Routine Post Op    left breast mass excision    HPI Lindsay Russo is a 67 y.o. female here today for her post op left breast excsion done 09/05/13.Patient states she has a blister due to the steri-strips.  HPI  Past Medical History  Diagnosis Date  . Hypertension   . Stroke 2011  . Arthritis   . Renal failure   . Personal history of colonic polyps   . Malignant neoplasm of ascending colon 2007    T2, N0.    Past Surgical History  Procedure Laterality Date  . Appendectomy  1995  . Ovarian cyst removal  1980  . Colon surgery  2007  . Colonoscopy  August 03, 2012    8 mm tubulovillous adenoma removed from the transverse colon. Followup exam in 2018 plan.  . Replacement total knee  2012  . Breast biopsy Right August 07, 2010    ssclerotic fibroadenoma removed from the right breast at the 10:00 position  . Breast surgery Left 09/05/13    excision    Family History  Problem Relation Age of Onset  . Gastric cancer Father     Social History History  Substance Use Topics  . Smoking status: Never Smoker   . Smokeless tobacco: Never Used  . Alcohol Use: No    Allergies  Allergen Reactions  . Amoxicillin Rash  . Sulfa Antibiotics Rash    Current Outpatient Prescriptions  Medication Sig Dispense Refill  . amLODipine (NORVASC) 10 MG tablet Take 1 tablet by mouth daily.      [name] aspirin 81 MG tablet Take 81 mg by mouth daily.      . pravastatin (PRAVACHOL) 80 MG tablet Take 1 tablet by mouth.       No current facility-administered medications for this visit.    Review of Systems Review of Systems  Constitutional: Negative.   Respiratory: Negative.   Cardiovascular: Negative.     Blood pressure 128/80, pulse 78, resp. rate 14, height 5\' 6"  (1.676 m), weight 229 lb (103.874 kg).  Physical Exam Physical Exam  Constitutional: She is  oriented to person, place, and time. She appears well-developed and well-nourished.  Pulmonary/Chest:  Left breast healing well.  Minimal bruising noted.  Neurological: She is alert and oriented to person, place, and time.  Skin: Skin is warm and dry.    Data Reviewed Pathology showed LCIS, pleomorphic type with 3 mm to the superior margin. Ultrasound was completed to determine if radiation therapy was needed if she would be a candidate for partial breast radiation. This showed a minimal spacing of 1.5 cm to the skin and a 1.4 x 3.3 x 5.0 deep cavity.  Assessment    LCIS, pleomorphic type left breast.     Plan    The case was discussed by phone with radiation oncology. No indication for radiation therapy.  ER/PR testing has been requested from pathology.   Will discuss antiestrogen therapy her followup appointment.       Lindsay Russo 09/12/2013, 9:48 PM

## 2013-09-13 ENCOUNTER — Encounter: Payer: Self-pay | Admitting: General Surgery

## 2013-09-18 ENCOUNTER — Encounter: Payer: Self-pay | Admitting: General Surgery

## 2013-09-20 LAB — PATHOLOGY REPORT

## 2013-09-21 ENCOUNTER — Encounter: Payer: Self-pay | Admitting: General Surgery

## 2013-09-26 ENCOUNTER — Ambulatory Visit (INDEPENDENT_AMBULATORY_CARE_PROVIDER_SITE_OTHER): Payer: Medicare Other | Admitting: General Surgery

## 2013-09-26 ENCOUNTER — Encounter: Payer: Self-pay | Admitting: General Surgery

## 2013-09-26 VITALS — BP 152/78 | HR 78 | Resp 14 | Ht 66.0 in | Wt 228.0 lb

## 2013-09-26 DIAGNOSIS — C50919 Malignant neoplasm of unspecified site of unspecified female breast: Secondary | ICD-10-CM

## 2013-09-26 DIAGNOSIS — D0500 Lobular carcinoma in situ of unspecified breast: Secondary | ICD-10-CM | POA: Insufficient documentation

## 2013-09-26 DIAGNOSIS — D0590 Unspecified type of carcinoma in situ of unspecified breast: Secondary | ICD-10-CM

## 2013-09-26 DIAGNOSIS — D0502 Lobular carcinoma in situ of left breast: Secondary | ICD-10-CM

## 2013-09-26 HISTORY — DX: Lobular carcinoma in situ of unspecified breast: D05.00

## 2013-09-26 MED ORDER — TAMOXIFEN CITRATE 20 MG PO TABS: 20.0000 mg | ORAL_TABLET | Freq: Every day | ORAL | Status: DC

## 2013-09-26 NOTE — Progress Notes (Signed)
Patient ID: Lindsay Russo, female   DOB: Jan 24, 1946, 67 y.o.   MRN: [id number]  Chief Complaint  Patient presents with  . Follow-up    post op left breast excsion done 09/05/13    HPI Lindsay Russo is a 67 y.o. female.  Here today for her follow up.  She is post op left breast excsion done 09/05/13 and states she is doing well. No new complaints.    HPI  Past Medical History  Diagnosis Date  . Hypertension   . Stroke 2011  . Arthritis   . Renal failure   . Personal history of colonic polyps   . Malignant neoplasm of ascending colon 2007    T2, N0.    Past Surgical History  Procedure Laterality Date  . Appendectomy  1995  . Ovarian cyst removal  1980  . Colon surgery  2007  . Colonoscopy  August 03, 2012    8 mm tubulovillous adenoma removed from the transverse colon. Followup exam in 2018 plan.  . Replacement total knee  2012  . Breast biopsy Right August 07, 2010    Sclerotic fibroadenoma removed from the right breast at the 10:00 position  . Breast surgery Left 09/05/13    LCIS, ER 90%, PR 60%, negative margins.    Family History  Problem Relation Age of Onset  . Gastric cancer Father     Social History History  Substance Use Topics  . Smoking status: Never Smoker   . Smokeless tobacco: Never Used  . Alcohol Use: No    Allergies  Allergen Reactions  . Tegaderm Ag Mesh [Silver] Other (See Comments)    blisters  . Amoxicillin Rash  . Sulfa Antibiotics Rash    Current Outpatient Prescriptions  Medication Sig Dispense Refill  . amLODipine (NORVASC) 10 MG tablet Take 1 tablet by mouth daily.      [name] aspirin 81 MG tablet Take 81 mg by mouth daily.      . pravastatin (PRAVACHOL) 80 MG tablet Take 1 tablet by mouth.      . tamoxifen (NOLVADEX) 20 MG tablet Take 1 tablet (20 mg total) by mouth daily.  90 tablet  3   No current facility-administered medications for this visit.    Review of Systems Review of Systems  Constitutional: Negative.    Respiratory: Negative.   Cardiovascular: Negative.     Blood pressure 152/78, pulse 78, resp. rate 14, height 5\' 6"  (1.676 m), weight 228 lb (103.42 kg).  Physical Exam Physical Exam  Constitutional: She is oriented to person, place, and time. She appears well-developed and well-nourished.  Pulmonary/Chest:  Incision and blistered areas well healed.   Neurological: She is alert and oriented to person, place, and time.  Skin: Skin is warm and dry.    Data Reviewed LCIS of the left breast, ER 90%, PR 60%.  Assessment    Doing well status post left breast wide excision.     Plan    Indications for antiestrogen therapy were reviewed. The pros and cons of tamoxifen therapy including those related to DVT, uterine malignancy and vasomotor symptoms were discussed. The patient is amenable to a trial for 30 days. She reports her tolerance at that time.        [name] 09/26/2013, 8:01 PM

## 2013-09-26 NOTE — Patient Instructions (Addendum)
Continue self breast exams. Call office for any new breast issues or concerns. Try antihormone tablet therapy for one month and call back to let us konw if she can tolerate it or not

## 2013-11-06 ENCOUNTER — Telehealth: Payer: Self-pay | Admitting: General Surgery

## 2013-11-06 NOTE — Telephone Encounter (Signed)
11-06-13 PT CAME IN TODAY TO LET us KNOW SHE WAS DOING FINE ON HER ANTIESTROGEN THERAPY,EXCEPT HAVING REAL BAD NIGHT SWEATS & HOT FLASHES.THESE BEGUN AFTER TAKING THE MEDS.SHE HAS HAD A LITTLE FEET & LEG SWELLING BEFORE THE MEDS & SHE IS GOING TO CHECK WITH HER PCP(DR FISHER) ABOUT THIS TODAY @ HER APPT. SHE THINKS IT MAYBE FROM ANOTHER MEDICINE SHE'S TAKING.

## 2014-01-30 ENCOUNTER — Encounter: Payer: Self-pay | Admitting: General Surgery

## 2014-01-30 ENCOUNTER — Telehealth: Payer: Self-pay | Admitting: *Deleted

## 2014-01-30 ENCOUNTER — Ambulatory Visit: Payer: Self-pay | Admitting: General Surgery

## 2014-01-30 NOTE — Telephone Encounter (Signed)
Patient not at home and message was left on cell number for patient to call the office.   Per Estill Bamberg at the [facility], patient was very upset after mammogram because she was told that she will need a left breast stereotactic biopsy.   Currently, her office visit follow up is scheduled for 02-19-14. I wanted to see if patient would be available to come in next week for follow up. If stereo biopsy is required, this would allow for patient to have completed on 02-12-14 if agreeable.

## 2014-02-06 ENCOUNTER — Telehealth: Payer: Self-pay | Admitting: General Surgery

## 2014-02-06 NOTE — Telephone Encounter (Signed)
Patient notified that I had reviewed her recent mammograms. We will move her appt up from 3/23 to 3/ 13 at 9: 30 AM. My suspicions are low for a new pathologic process. The radiologist mentions DCIS: None was identified in the biopsy material.

## 2014-02-09 ENCOUNTER — Ambulatory Visit (INDEPENDENT_AMBULATORY_CARE_PROVIDER_SITE_OTHER): Payer: Medicare Other | Admitting: General Surgery

## 2014-02-09 ENCOUNTER — Encounter: Payer: Self-pay | Admitting: General Surgery

## 2014-02-09 VITALS — BP 160/78 | HR 80 | Resp 14 | Ht 66.0 in | Wt 227.0 lb

## 2014-02-09 DIAGNOSIS — C50919 Malignant neoplasm of unspecified site of unspecified female breast: Secondary | ICD-10-CM

## 2014-02-09 MED ORDER — RALOXIFENE HCL 60 MG PO TABS: 60.0000 mg | ORAL_TABLET | Freq: Every day | ORAL | Status: DC

## 2014-02-09 NOTE — Progress Notes (Signed)
Patient ID: Lindsay Russo, female   DOB: 09/26/1946, 68 y.o.   MRN: [id number]  Chief Complaint  Patient presents with  . Follow-up    mammogram    HPI Lindsay Russo is a 68 y.o. female who presents for a breast evaluation. The most recent left breast mammogram was done on 01/30/14 Patient does perform regular self breast checks and gets regular mammograms done.  Patient states she  Is having problems with Tamoxifen .She is having very bad hot flashes and night sweats.   HPI  Past Medical History  Diagnosis Date  . Hypertension   . Stroke 2011  . Arthritis   . Renal failure   . Personal history of colonic polyps   . Malignant neoplasm of ascending colon 2007    T2, N0.  [name] LCIS September 05, 2013    Left breast, ER 90%, PR 60%    Past Surgical History  Procedure Laterality Date  . Appendectomy  1995  . Ovarian cyst removal  1980  . Colon surgery  2007  . Colonoscopy  August 03, 2012    8 mm tubulovillous adenoma removed from the transverse colon. Followup exam in 2018 plan.  . Replacement total knee  2012  . Breast biopsy Right August 07, 2010    Sclerotic fibroadenoma removed from the right breast at the 10:00 position  . Breast surgery Left 09/05/13    LCIS, ER 90%, PR 60%, negative margins.    Family History  Problem Relation Age of Onset  . Gastric cancer Father     Social History History  Substance Use Topics  . Smoking status: Never Smoker   . Smokeless tobacco: Never Used  . Alcohol Use: No    Allergies  Allergen Reactions  . Tegaderm Ag Mesh [Silver] Other (See Comments)    blisters  . Amoxicillin Rash  . Sulfa Antibiotics Rash    Current Outpatient Prescriptions  Medication Sig Dispense Refill  . amLODipine (NORVASC) 10 MG tablet Take 1 tablet by mouth daily.      [name] aspirin 81 MG tablet Take 81 mg by mouth daily.      . pravastatin (PRAVACHOL) 80 MG tablet Take 1 tablet by mouth.      . tamoxifen (NOLVADEX) 20 MG tablet Take 1 tablet (20 mg  total) by mouth daily.  90 tablet  3  . raloxifene (EVISTA) 60 MG tablet Take 1 tablet (60 mg total) by mouth daily.  30 tablet  11   No current facility-administered medications for this visit.    Review of Systems Review of Systems  Constitutional: Negative.   Respiratory: Negative.   Cardiovascular: Negative.     Blood pressure 160/78, pulse 80, resp. rate 14, height 5\' 6"  (1.676 m), weight 227 lb (102.967 kg).  Physical Exam Physical Exam  Constitutional: She is oriented to person, place, and time. She appears well-developed and well-nourished.  Eyes: Conjunctivae are normal.  Neck: Neck supple.  Cardiovascular: Normal rate, regular rhythm and normal heart sounds.   Pulmonary/Chest: Breath sounds normal. Right breast exhibits no inverted nipple, no mass, no nipple discharge, no skin change and no tenderness. Left breast exhibits no inverted nipple, no mass, no nipple discharge, no skin change and no tenderness.  Well healed incision in the left breast from 11-1 o'clock.  Lymphadenopathy:    She has no cervical adenopathy.    She has no axillary adenopathy.  Neurological: She is alert and oriented to person, place, and time.  Skin:  Skin is warm and dry.    Data Reviewed MRI of the breast completed August 25, 2013 showed only changes related to her prior biopsies.  Biopsy of the central portion of the left breast in the 12 o'clock position had shown LCIS. No additional findings were appreciated of wide local excision. ER/PR positive.Biopsy of the upper outer quadrant of the left breast that showed a fibroadenoma.  Left breast mammogram dated January 30, 2014 was reported to show a 2 mm group of calcifications in the breast felt similar to those previously resected. Post lumpectomy scarring is noted.  Assessment    Microcalcifications on first post biopsy/wide excision imaging of the left breast.      Plan    The likelihood of mammographically detectable LCIS in a  six-month period with a negative prior MRI is exceptionally low, it is unclear if the microcalcifications noted by the radiologist relates those with a fibroadenoma in the upper outer quadrant or those in the 12 o'clock position where the LCIS was identified.  Options for management were reviewed: 1) repeat stereotactic biopsy at this time versus 2) 6 month followup exam with bilateral breast imaging. The risks associated with delayed R. Exceptionally small. If she has another foci of LCIS no additional therapy besides that related to antiestrogen treatment would be recommended. Only if frank DCIS or invasive ligaments he was identified but additional surgical therapy or radiation be recommended.     At this time the patient is comfortable, as MI with a 6 month followup exam. If she changes her mind she was encouraged to call for an earlier follow.  The patient has had significant vasomotor symptoms since the initiation of tamoxifen. She reports that the "hot flashes" during the day or not troublesome, but the night sweats necessitating a change in bed closed in pillow cases has not improved since the initiation of tamoxifen therapy.  Prior to instituting SSRI therapy, were going to try a change to Evista, 60 mg daily to see if this resolves an improvement in her vasomotor symptoms. If after 3 months she sees no improvement would likely switch back to tamoxifen with the addition of Effexor 75 mg daily.        Robert Bellow 02/10/2014, 1:50 PM

## 2014-02-09 NOTE — Patient Instructions (Addendum)
The patient is to return in 6 months for bilateral diagnostic mammogram @ [facility]. Call us in one month and let us know how Evista is working.

## 2014-02-10 ENCOUNTER — Encounter: Payer: Self-pay | Admitting: General Surgery

## 2014-02-13 ENCOUNTER — Telehealth: Payer: Self-pay | Admitting: *Deleted

## 2014-02-13 NOTE — Telephone Encounter (Signed)
Calling to ask patient how much Evista was at her pharmacy copay/coupons etc.?

## 2014-02-14 NOTE — Telephone Encounter (Signed)
$   129 she has a deductible then drops to $45/month. I tlaked with her about the coupon and she will call pharmacy and call me back.

## 2014-02-14 NOTE — Telephone Encounter (Signed)
She talked with pharmacy and she still does not feel she can afford $45/month (generic). She plans on taking the Tamoxifen ($8/mon) she just has night sweats (has 3 months worth at home). Possibly anything else to take? Otherwise she is willing to take tamoxifen and hopes her body adjust to side effects or we call her back with a new medication.

## 2014-02-19 ENCOUNTER — Telehealth: Payer: Self-pay | Admitting: General Surgery

## 2014-02-19 ENCOUNTER — Ambulatory Visit: Payer: Medicare Other | Admitting: General Surgery

## 2014-02-19 NOTE — Telephone Encounter (Signed)
Patient contacted regarding methods to help deal with a similar symptoms from tamoxifen therapy.  Of this to $45 per month was too expensive.  Effexor, 75 mg per day because $130 per month.  No inexpensive options.  Message left for patient to contact office regarding followup.

## 2014-02-20 ENCOUNTER — Telehealth: Payer: Self-pay | Admitting: *Deleted

## 2014-02-20 NOTE — Telephone Encounter (Signed)
She could try evening primrose oil.  No other prescription meds recommended at this time.

## 2014-02-20 NOTE — Telephone Encounter (Signed)
She could try evening primrose oil. No other prescription meds recommended at this time. She is taking the Tamoxifen at night and it seems to be better, only 3 hot flashes last week. She appreciates Dr Curly [place]ores efforts.

## 2014-08-09 ENCOUNTER — Encounter: Payer: Self-pay | Admitting: General Surgery

## 2014-08-09 LAB — HM MAMMOGRAPHY

## 2014-08-20 ENCOUNTER — Ambulatory Visit (INDEPENDENT_AMBULATORY_CARE_PROVIDER_SITE_OTHER): Payer: Medicare Other | Admitting: General Surgery

## 2014-08-20 ENCOUNTER — Encounter: Payer: Self-pay | Admitting: General Surgery

## 2014-08-20 VITALS — BP 152/68 | HR 74 | Resp 14 | Ht 66.0 in | Wt 231.0 lb

## 2014-08-20 DIAGNOSIS — Z853 Personal history of malignant neoplasm of breast: Secondary | ICD-10-CM

## 2014-08-20 NOTE — Progress Notes (Signed)
Patient ID: Lindsay Russo, female   DOB: 05/10/1946, 68 y.o.   MRN: [id number]  Chief Complaint  Patient presents with  . Follow-up    mammogram    HPI Lindsay Russo is a 68 y.o. female who presents for a breast evaluation. The most recent mammogram was done on 08/09/14 at [facility]. [name]  Patient does perform regular self breast checks and gets regular mammograms done.    HPI  Past Medical History  Diagnosis Date  . Hypertension   . Stroke 2011  . Arthritis   . Renal failure   . Personal history of colonic polyps   . Malignant neoplasm of ascending colon November 2007    T2, N0.5.2 cm low-grade adenocarcinoma. 0/28 nodes positive.  [name] LCIS September 05, 2013    Left breast, ER 90%, PR 60%    Past Surgical History  Procedure Laterality Date  . Appendectomy  1995  . Ovarian cyst removal  1980  . Colonoscopy  August 03, 2012    8 mm tubulovillous adenoma removed from the transverse colon. Followup exam in 2018 plan.  . Replacement total knee  2012  . Breast biopsy Right August 07, 2010    Sclerotic fibroadenoma removed from the right breast at the 10:00 position  . Breast surgery Left 09/05/13    LCIS, ER 90%, PR 60%, negative margins.  . Colon surgery  2007    Family History  Problem Relation Age of Onset  . Gastric cancer Father     Social History History  Substance Use Topics  . Smoking status: Never Smoker   . Smokeless tobacco: Never Used  . Alcohol Use: No    Allergies  Allergen Reactions  . Tegaderm Ag Mesh [Silver] Other (See Comments)    blisters  . Amoxicillin Rash  . Sulfa Antibiotics Rash    Current Outpatient Prescriptions  Medication Sig Dispense Refill  . amLODipine (NORVASC) 10 MG tablet Take 1 tablet by mouth daily.      [name] aspirin 81 MG tablet Take 81 mg by mouth daily.      [name] lovastatin (MEVACOR) 20 MG tablet       . pravastatin (PRAVACHOL) 80 MG tablet Take 1 tablet by mouth.      . tamoxifen (NOLVADEX) 20 MG tablet Take 1 tablet (20 mg total)  by mouth daily.  90 tablet  3   No current facility-administered medications for this visit.    Review of Systems Review of Systems  Constitutional: Negative.   Respiratory: Negative.   Cardiovascular: Negative.     Blood pressure 152/68, pulse 74, resp. rate 14, height 5\' 6"  (1.676 m), weight 231 lb (104.781 kg).  Physical Exam Physical Exam  Constitutional: She is oriented to person, place, and time. She appears well-developed and well-nourished.  Eyes: Conjunctivae are normal. No scleral icterus.  Neck: Neck supple.  Cardiovascular: Normal rate, regular rhythm and normal heart sounds.   Pulmonary/Chest: Effort normal and breath sounds normal. Right breast exhibits no inverted nipple, no mass, no nipple discharge, no skin change and no tenderness. Left breast exhibits no inverted nipple, no mass, no nipple discharge, no skin change and no tenderness. Breasts are asymmetrical (right breast 2 cup size bigger than left).    Left breast well healed incision from 10-1 o'clock.   Neurological: She is alert and oriented to person, place, and time.  Skin: Skin is warm and dry.    Data Reviewed Bilateral diagnostic mammograms completed at [facility] on August 09, 2014 were reviewed. No interval change. [facility].  Assessment    Doing well status breast conservation for LCIS of the left breast. Tolerating tamoxifen without progressive vasomotor symptoms.  Tubulovillous adenoma from the transverse colon 2013. Repeat exam in 2016 will be appropriate.    Plan    We'll plan for a followup examination and repeat bilateral diagnostic mammograms in one year.     PCP: Nolene Bernheim 08/21/2014, 6:40 AM

## 2014-08-20 NOTE — Patient Instructions (Signed)
The patient has been asked to return to the office in one year with a bilateral diagnostic mammogram. 

## 2014-08-21 ENCOUNTER — Encounter: Payer: Self-pay | Admitting: General Surgery

## 2014-08-21 DIAGNOSIS — Z853 Personal history of malignant neoplasm of breast: Secondary | ICD-10-CM | POA: Insufficient documentation

## 2014-10-01 ENCOUNTER — Encounter: Payer: Self-pay | Admitting: General Surgery

## 2014-10-24 ENCOUNTER — Telehealth: Payer: Self-pay | Admitting: *Deleted

## 2014-10-24 MED ORDER — TAMOXIFEN CITRATE 20 MG PO TABS: 20.0000 mg | ORAL_TABLET | Freq: Every day | ORAL | Status: DC

## 2014-10-24 NOTE — Telephone Encounter (Signed)
RX sent

## 2014-11-12 LAB — BASIC METABOLIC PANEL
BUN: 14 mg/dL (ref 4–21)
CREATININE: 0.9 mg/dL (ref 0.5–1.1)
Glucose: 113 mg/dL
Potassium: 4.5 mmol/L (ref 3.4–5.3)
Sodium: 144 mmol/L (ref 137–147)

## 2014-11-12 LAB — HEPATIC FUNCTION PANEL
ALT: 10 U/L (ref 7–35)
AST: 21 U/L (ref 13–35)

## 2014-11-12 LAB — LIPID PANEL
Cholesterol: 152 mg/dL (ref 0–200)
HDL: 39 mg/dL (ref 35–70)
LDL CALC: 64 mg/dL
Triglycerides: 245 mg/dL — AB (ref 40–160)

## 2014-11-12 LAB — HEMOGLOBIN A1C: HEMOGLOBIN A1C: 5.9 % (ref 4.0–6.0)

## 2014-11-12 LAB — TSH: TSH: 3.08 u[IU]/mL (ref 0.41–5.90)

## 2014-11-12 LAB — CBC AND DIFFERENTIAL
HCT: 41 % (ref 36–46)
Hemoglobin: 14 g/dL (ref 12.0–16.0)
Platelets: 198 10*3/uL (ref 150–399)
WBC: 10 10^3/mL

## 2015-03-22 NOTE — Op Note (Signed)
PATIENT NAME:  Lindsay Russo, Lindsay Russo MR#:  [id number] DATE OF BIRTH:  11-03-1946  DATE OF PROCEDURE:  09/05/2013  PREOPERATIVE DIAGNOSIS: Lobular carcinoma in situ/ductal carcinoma in situ, left breast.   POSTOPERATIVE DIAGNOSIS:  Lobular carcinoma in situ/ductal carcinoma in situ, left breast.   OPERATIVE PROCEDURE: Wide local excision with ultrasound guidance.   SURGEON: Hervey Ard, MD  ANESTHESIA: General by LMA, Marcaine 0.5% with 1:200,000 units of epinephrine 30 mL local infiltration.   ESTIMATED BLOOD LOSS: Minimal.   CLINICAL NOTE: This 69 year old woman had 2 areas of calcifications in the left breast. The  lateral lesion was benign. The superior lesion showed evidence of LCIS/DCIS. She was felt to be a candidate for wide excision to fulfill her desired breast conservation.   OPERATIVE NOTE: The breast was prepped with ChloraPrep and draped. Ultrasound was used to confirm the previous biopsy cavity. This was graded at 1 cm from the skin. The area was infiltrated with Marcaine for postoperative analgesia. A transverse incision was made over the area of concern and carried down through the skin and subcutaneous tissue with hemostasis achieved by electrocautery. A block of tissue 5 x 5 x 4 cm in diameter was carried down to but not including the pectoralis fascia. The specimen was orientated and specimen radiograph confirmed the previously-placed "cylinder" marker was in the center of the specimen. Pathologist reported the inflammatory process extended to the deep level. As this was a biopsy cavity, this was thought to be acceptable. Permanent sections will be reviewed, of course.   The deep tissue was then mobilized and then approximated with interrupted 2-0 Vicryl figure-of-eight sutures. The deep adipose layer was approximated in a similar fashion. The subcutaneous fat was closed with running 2-0 Vicryl and the skin closed with a running 4-0 Vicryl subcuticular suture. Benzoin,  Steri-Strips, Telfa dressing was applied. Fluff gauze, Kerlix and an Ace wrap was then placed.   The patient tolerated the procedure well and was taken to the recovery room in stable condition.   ____________________________ Robert Bellow, MD jwb:cs D: 09/05/2013 14:14:26 ET T: 09/05/2013 15:01:24 ET JOB#: [id number]  cc: Robert Bellow, MD, <Dictator> Kirstie Peri. Caryn Section, MD Severo Beber Amedeo Kinsman MD ELECTRONICALLY SIGNED 09/06/2013 8:14

## 2015-03-26 ENCOUNTER — Encounter: Payer: Self-pay | Admitting: Family Medicine

## 2015-03-26 ENCOUNTER — Other Ambulatory Visit: Payer: Self-pay | Admitting: Family Medicine

## 2015-03-26 DIAGNOSIS — E782 Mixed hyperlipidemia: Secondary | ICD-10-CM | POA: Insufficient documentation

## 2015-03-26 DIAGNOSIS — I10 Essential (primary) hypertension: Secondary | ICD-10-CM | POA: Insufficient documentation

## 2015-03-26 DIAGNOSIS — I679 Cerebrovascular disease, unspecified: Secondary | ICD-10-CM | POA: Insufficient documentation

## 2015-03-26 DIAGNOSIS — M5136 Other intervertebral disc degeneration, lumbar region: Secondary | ICD-10-CM | POA: Insufficient documentation

## 2015-03-26 DIAGNOSIS — E669 Obesity, unspecified: Secondary | ICD-10-CM | POA: Insufficient documentation

## 2015-03-26 DIAGNOSIS — R7303 Prediabetes: Secondary | ICD-10-CM | POA: Insufficient documentation

## 2015-05-13 ENCOUNTER — Ambulatory Visit (INDEPENDENT_AMBULATORY_CARE_PROVIDER_SITE_OTHER): Payer: Medicare Other | Admitting: Family Medicine

## 2015-05-13 ENCOUNTER — Encounter: Payer: Self-pay | Admitting: Family Medicine

## 2015-05-13 ENCOUNTER — Telehealth: Payer: Self-pay | Admitting: Family Medicine

## 2015-05-13 VITALS — BP 140/94 | HR 60 | Temp 98.0°F | Resp 16 | Wt 233.0 lb

## 2015-05-13 DIAGNOSIS — E782 Mixed hyperlipidemia: Secondary | ICD-10-CM

## 2015-05-13 DIAGNOSIS — D229 Melanocytic nevi, unspecified: Secondary | ICD-10-CM

## 2015-05-13 DIAGNOSIS — D692 Other nonthrombocytopenic purpura: Secondary | ICD-10-CM | POA: Diagnosis not present

## 2015-05-13 DIAGNOSIS — R7309 Other abnormal glucose: Secondary | ICD-10-CM | POA: Diagnosis not present

## 2015-05-13 DIAGNOSIS — R42 Dizziness and giddiness: Secondary | ICD-10-CM | POA: Diagnosis not present

## 2015-05-13 DIAGNOSIS — R7303 Prediabetes: Secondary | ICD-10-CM

## 2015-05-13 LAB — POCT GLYCOSYLATED HEMOGLOBIN (HGB A1C)
Est. average glucose Bld gHb Est-mCnc: 120
HEMOGLOBIN A1C: 5.8

## 2015-05-13 MED ORDER — PROMETHAZINE HCL 25 MG PO TABS: 25.0000 mg | ORAL_TABLET | Freq: Three times a day (TID) | ORAL | Status: DC | PRN

## 2015-05-13 MED ORDER — MECLIZINE HCL 25 MG PO TABS: 25.0000 mg | ORAL_TABLET | Freq: Three times a day (TID) | ORAL | Status: DC | PRN

## 2015-05-13 NOTE — Progress Notes (Signed)
Patient: Lindsay Russo Female    DOB: 06/25/1946   69 y.o.   MRN: [id number] Visit Date: 05/13/2015  Today's Provider: Lelon Huh, MD   Chief Complaint  Patient presents with  . Follow-up    6 months  . Hypertension  . Hyperlipidemia   Subjective:    Hypertension This is a chronic problem. The current episode started more than 1 year ago. The problem is unchanged. The problem is controlled. Associated symptoms include malaise/fatigue and peripheral edema. Pertinent negatives include no blurred vision, chest pain, headaches, neck pain, palpitations or shortness of breath.  Hyperlipidemia This is a chronic problem. The current episode started more than 1 year ago. The problem is controlled. Associated symptoms include leg pain. Pertinent negatives include no chest pain or shortness of breath. Current antihyperlipidemic treatment includes exercise. Risk factors for coronary artery disease include hypertension.   Pre-Diabetes=Last seen 11/13/2014 A1C=5.9 ------------------------------------------------------------------------   Hypertension, follow-up:  BP Readings from Last 3 Encounters:  05/13/15 140/94  08/20/14 152/68  02/09/14 160/78    She was last seen for hypertension 6 months ago.  BP at that visit was 152/88. Management changes since that visit include none  She reports good compliance with treatment. She is not having side effects. none  She is not exercising. She is not adherent to low salt diet.   Outside blood pressures are 130/80. She is experiencing none.  Patient denies none.   Cardiovascular risk factors include advanced age (older than 26 for men, 48 for women), dyslipidemia and hypertension.  Use of agents associated with hypertension: none.   ------------------------------------------------------------------------    Lipid/Cholesterol, Follow-up:   Last seen for this 6 months ago.  Management changes since that visit include/none.  Last Lipid  Panel: 11/12/2014 LDL=64, HDL=39, TG=245.   She reports good compliance with treatment. She is not having side effects.   Wt Readings from Last 3 Encounters:  05/13/15 233 lb (105.688 kg)  08/20/14 231 lb (104.781 kg)  02/09/14 227 lb (102.967 kg)    ------------------------------------------------------------------------   Prediabetes Last A1c in December was 5.9 She has been working on cutting back on sweets and starches, is feeling well.    Mole She has had a mole on her left shoulder for several years, but her husband stated it seems to be getting larger and changing colors    Vertigo She has had occasional episodes of vertigo for years which respond well to meclizine and phenergan. She had recent episode and ran of meds and requests a refill.    Previous Medications   AMLODIPINE (NORVASC) 10 MG TABLET    Take 1 tablet by mouth daily.   ASPIRIN 81 MG TABLET    Take 81 mg by mouth daily.   LOVASTATIN (MEVACOR) 20 MG TABLET       TAMOXIFEN (NOLVADEX) 20 MG TABLET    Take 1 tablet (20 mg total) by mouth daily.    Review of Systems  Constitutional: Positive for malaise/fatigue.  Eyes: Negative for blurred vision.  Respiratory: Negative for chest tightness and shortness of breath.   Cardiovascular: Positive for leg swelling. Negative for chest pain and palpitations.       Ankles  Musculoskeletal: Negative for neck pain.  Neurological: Positive for dizziness. Negative for light-headedness and headaches.    History  Substance Use Topics  . Smoking status: Never Smoker   . Smokeless tobacco: Never Used  . Alcohol Use: No   Objective:   BP 140/94 mmHg  Pulse 60  Temp(Src) 98 F (36.7 C) (Oral)  Resp 16  Wt 233 lb (105.688 kg)  Physical Exam   General Appearance:    Alert, cooperative, no distress  Eyes:    PERRL, conjunctiva/corneas clear, EOM's intact       Lungs:     Clear to auscultation bilaterally, respirations unlabored  Heart:    Regular rate and  rhythm  Neurologic:   Awake, alert, oriented x 3. No apparent focal neurological           defect.   Skin:   Nearly 1cm dm well circumscribed mole on superior aspect f left shoulder mostly light brown with areas or darker pigmentation. Also note scattered areas of purpora on both upper extremitites.      Results for orders placed or performed in visit on 05/13/15  POCT HgB A1C  Result Value Ref Range   Hemoglobin A1C 5.8    Est. average glucose Bld gHb Est-mCnc 120        Assessment & Plan:     1. Prediabetes Well controlled with diet   - POCT HgB A1C  2. Vertigo Needs refill meds which continue to work well.  - meclizine (ANTIVERT) 25 MG tablet; Take 1 tablet (25 mg total) by mouth 3 (three) times daily as needed for dizziness.  Dispense: 30 tablet; Refill: 0 - promethazine (PHENERGAN) 25 MG tablet; Take 1 tablet (25 mg total) by mouth every 8 (eight) hours as needed for nausea or vomiting.  Dispense: 20 tablet; Refill: 0  3. Senile purpura Keep aspirin on hold as she report very easy bruising.  4. Hyperlipidemia, mixed She is tolerating lovastatin well with no adverse effects.   - Lipid Profile  5. Nevus  - Ambulatory referral to Dermatology   Follow up: Return in about 6 months (around 11/12/2015).

## 2015-05-13 NOTE — Telephone Encounter (Signed)
Called patient back and she states she figured it out and no longer had any questions.

## 2015-05-13 NOTE — Telephone Encounter (Signed)
Pt is requesting a call back.  Pt has a question about her summary sheet she received today.  AJ#[phone]/[id number]

## 2015-05-14 ENCOUNTER — Telehealth: Payer: Self-pay | Admitting: *Deleted

## 2015-05-14 LAB — LIPID PANEL
CHOL/HDL RATIO: 5.9 ratio — AB (ref 0.0–4.4)
CHOLESTEROL TOTAL: 164 mg/dL (ref 100–199)
HDL: 28 mg/dL — ABNORMAL LOW (ref >39)
LDL Calculated: 71 mg/dL (ref 0–99)
TRIGLYCERIDES: 325 mg/dL — AB (ref 0–149)
VLDL Cholesterol Cal: 65 mg/dL — ABNORMAL HIGH (ref 5–40)

## 2015-05-14 MED ORDER — LOVASTATIN 40 MG PO TABS: 40.0000 mg | ORAL_TABLET | Freq: Every day | ORAL | Status: DC

## 2015-05-14 NOTE — Telephone Encounter (Signed)
-----   Message from Birdie Sons, MD sent at 05/14/2015  7:17 AM EDT ----- Chol is pretty good at 164, but triglycerides pretty high at 325. Recommend increase lovastatin to 40mg  daily, #30, rf x 5. Follow up 5-6 months re blood sugars, BP and chol.

## 2015-06-19 ENCOUNTER — Other Ambulatory Visit: Payer: Self-pay

## 2015-06-19 DIAGNOSIS — Z853 Personal history of malignant neoplasm of breast: Secondary | ICD-10-CM

## 2015-08-12 ENCOUNTER — Encounter: Payer: Self-pay | Admitting: General Surgery

## 2015-08-12 ENCOUNTER — Ambulatory Visit (INDEPENDENT_AMBULATORY_CARE_PROVIDER_SITE_OTHER): Payer: Medicare Other | Admitting: General Surgery

## 2015-08-12 VITALS — BP 132/68 | HR 72 | Resp 16 | Ht 66.0 in | Wt 234.0 lb

## 2015-08-12 DIAGNOSIS — D0502 Lobular carcinoma in situ of left breast: Secondary | ICD-10-CM | POA: Diagnosis not present

## 2015-08-12 DIAGNOSIS — Z853 Personal history of malignant neoplasm of breast: Secondary | ICD-10-CM

## 2015-08-12 NOTE — Progress Notes (Signed)
Patient ID: Lindsay Russo, female   DOB: March 05, 1946, 70 y.o.   MRN: [id number]  Chief Complaint  Patient presents with  . Follow-up    mammogram    HPI Lindsay Russo is a 69 y.o. female here today for here follow up from her bilateral mammogram done on 08/02/15 at [facility]. Denies any breast problems. She is still doing her regular breast checks each month, still having night sweats from tamoxifen but doing much better. Patient concerned about follow up for her colonoscopy, records show due in 2018. HPI  Past Medical History  Diagnosis Date  . Hypertension   . Stroke 09/17/2010    Right thalamic without residual effects  . Arthritis   . Renal failure   . Personal history of colonic polyps   . Malignant neoplasm of ascending colon November 2007    T2, N0.5.2 cm low-grade adenocarcinoma. 0/28 nodes positive.  [name] LCIS September 05, 2013    Left breast, ER 90%, PR 60%  . History of kidney stones     Past Surgical History  Procedure Laterality Date  . Appendectomy  1995  . Ovarian cyst removal  1980  . Colonoscopy  August 03, 2012    8 mm tubulovillous adenoma removed from the transverse colon. Followup exam in 2018 plan.  . Replacement total knee  2012  . Breast biopsy Right August 07, 2010    Sclerotic fibroadenoma removed from the right breast at the 10:00 position  . Breast surgery Left 09/05/13    LCIS, ER 90%, PR 60%, negative margins.  . Colon surgery  2007  . Lumbar disc surgery  12/2010    L4-5 microdiscectomy Dr. Mauri Pole    Family History  Problem Relation Age of Onset  . Gastric cancer Father     Social History Social History  Substance Use Topics  . Smoking status: Never Smoker   . Smokeless tobacco: Never Used  . Alcohol Use: No    Allergies  Allergen Reactions  . Tegaderm Ag Mesh [Silver] Other (See Comments)    blisters  . Amoxicillin Rash  . Sulfa Antibiotics Rash    Current Outpatient Prescriptions  Medication Sig Dispense Refill  . amLODipine  (NORVASC) 10 MG tablet Take 1 tablet by mouth daily.    [name] lovastatin (MEVACOR) 40 MG tablet Take 1 tablet (40 mg total) by mouth at bedtime. 90 tablet 1  . meclizine (ANTIVERT) 25 MG tablet Take 1 tablet (25 mg total) by mouth 3 (three) times daily as needed for dizziness. 30 tablet 0  . promethazine (PHENERGAN) 25 MG tablet Take 1 tablet (25 mg total) by mouth every 8 (eight) hours as needed for nausea or vomiting. 20 tablet 0  . tamoxifen (NOLVADEX) 20 MG tablet Take 1 tablet (20 mg total) by mouth daily. 90 tablet 3   No current facility-administered medications for this visit.    Review of Systems Review of Systems  Constitutional: Negative.   Respiratory: Negative.   Cardiovascular: Positive for leg swelling (bilateral).    Blood pressure 132/68, pulse 72, resp. rate 16, height 5\' 6"  (1.676 m), weight 234 lb (106.142 kg).  Physical Exam Physical Exam  Constitutional: She is oriented to person, place, and time. She appears well-developed and well-nourished.  Eyes: Conjunctivae are normal. No scleral icterus.  Neck: Neck supple.  Cardiovascular: Normal rate, regular rhythm and normal heart sounds.   Pulmonary/Chest: Effort normal and breath sounds normal. Right breast exhibits no inverted nipple, no mass, no nipple discharge, no  skin change and no tenderness. Left breast exhibits no inverted nipple, no mass, no nipple discharge, no skin change and no tenderness. Breasts are asymmetrical.  Left breast 2 cup sizes larger than right  Well healed incision 10-2 o'clock in left breast  Lymphadenopathy:    She has no cervical adenopathy.    She has no axillary adenopathy.  Neurological: She is alert and oriented to person, place, and time.  Skin: Skin is warm and dry.  Psychiatric: Her behavior is normal.    Data Reviewed Bilateral diagnostic mammograms completed at [facility] dated 08/02/2015 were reviewed. Post lumpectomy changes are noted in the 12:00 position a left breast.  BI-RADS-2.  Assessment    Doing well and tolerating tamoxifen therapy for chemoprevention.    Plan    The patient will continue tamoxifen 20 mg daily.    Patient to return in one year with bilateral diagnostic mammogram.   PCP: Dr. Stoney Bang, [place] 08/12/2015, 9:30 PM

## 2015-08-12 NOTE — Patient Instructions (Signed)
Call the office with questions and concerns.

## 2015-11-18 ENCOUNTER — Encounter: Payer: Self-pay | Admitting: Family Medicine

## 2015-11-18 ENCOUNTER — Ambulatory Visit (INDEPENDENT_AMBULATORY_CARE_PROVIDER_SITE_OTHER): Payer: Medicare Other | Admitting: Family Medicine

## 2015-11-18 VITALS — BP 148/74 | HR 73 | Temp 98.5°F | Resp 16 | Ht 65.5 in | Wt 234.0 lb

## 2015-11-18 DIAGNOSIS — Z124 Encounter for screening for malignant neoplasm of cervix: Secondary | ICD-10-CM

## 2015-11-18 DIAGNOSIS — E782 Mixed hyperlipidemia: Secondary | ICD-10-CM | POA: Diagnosis not present

## 2015-11-18 DIAGNOSIS — Z0000 Encounter for general adult medical examination without abnormal findings: Secondary | ICD-10-CM

## 2015-11-18 DIAGNOSIS — E2839 Other primary ovarian failure: Secondary | ICD-10-CM | POA: Diagnosis not present

## 2015-11-18 DIAGNOSIS — R7303 Prediabetes: Secondary | ICD-10-CM

## 2015-11-18 DIAGNOSIS — I10 Essential (primary) hypertension: Secondary | ICD-10-CM

## 2015-11-18 NOTE — Progress Notes (Signed)
Patient: ALIEYAH WAHEED, Female    DOB: 1946/07/23, 69 y.o.   MRN: [id number] Visit Date: 11/18/2015  Today's Provider: Lelon Huh, MD   Chief Complaint  Patient presents with  . Annual Exam  . Hypertension    follow up  . Hyperlipidemia    follow up  . Hyperglycemia    follow up   Subjective:    Annual physical  Lindsay Russo is a 69 y.o. female. She feels fairly well. She reports exercising twice a week walking. She reports she is sleeping poorly.  -----------------------------------------------------------  Hypertension, follow-up:  BP Readings from Last 3 Encounters:  11/18/15 148/74  08/12/15 132/68  05/13/15 140/94    She was last seen for hypertension 1 years ago.  BP at that visit was 140/94. Management since that visit includes no changes. She reports good compliance with treatment. She is not having side effects.  She is exercising. She is adherent to low salt diet.   Outside blood pressures are 130's/80. She is experiencing fatigue and lower extremity edema.  Patient denies chest pain, chest pressure/discomfort, claudication, dyspnea, exertional chest pressure/discomfort and irregular heart beat.   Cardiovascular risk factors include advanced age (older than 39 for men, 47 for women), dyslipidemia and hypertension.  Use of agents associated with hypertension: none.     Weight trend: stable Wt Readings from Last 3 Encounters:  11/18/15 234 lb (106.142 kg)  08/12/15 234 lb (106.142 kg)  05/13/15 233 lb (105.688 kg)    Current diet: in general, a "healthy" diet    ------------------------------------------------------------------------   Lipid/Cholesterol, Follow-up:   Last seen for this6 months ago.  Management changes since that visit include increasing Lovastatin to 40mg  daily. . Last Lipid Panel:    Component Value Date/Time   CHOL 164 05/13/2015 0912   CHOL 152 11/12/2014   TRIG 325* 05/13/2015 0912   HDL 28* 05/13/2015  0912   HDL 39 11/12/2014   CHOLHDL 5.9* 05/13/2015 0912   LDLCALC 71 05/13/2015 0912   LDLCALC 64 11/12/2014    Risk factors for vascular disease include hypercholesterolemia and hypertension  She reports good compliance with treatment. She is not having side effects.  Current symptoms include none and have been stable. Weight trend: stable Prior visit with dietician: no Current diet: in general, a "healthy" diet   Current exercise: hiking  Wt Readings from Last 3 Encounters:  11/18/15 234 lb (106.142 kg)  08/12/15 234 lb (106.142 kg)  05/13/15 233 lb (105.688 kg)    -------------------------------------------------------------------  Prediabetes, Follow-up:   Lab Results  Component Value Date   HGBA1C 5.8 05/13/2015   HGBA1C 5.9 11/12/2014   GLUCOSE 160* 05/04/2013    Last seen for for this6 months ago.  Management since that visit includes no changes. Current symptoms include none and have been stable.  Weight trend: stable Prior visit with dietician: no Current diet: in general, a "healthy" diet   Current exercise: hiking  Pertinent Labs:    Component Value Date/Time   CHOL 164 05/13/2015 0912   CHOL 152 11/12/2014   TRIG 325* 05/13/2015 0912   CHOLHDL 5.9* 05/13/2015 0912   CREATININE 0.9 11/12/2014   CREATININE 1.12 05/04/2013 2326    Wt Readings from Last 3 Encounters:  11/18/15 234 lb (106.142 kg)  08/12/15 234 lb (106.142 kg)  05/13/15 233 lb (105.688 kg)     Review of Systems  Constitutional: Positive for diaphoresis and fatigue. Negative for fever and chills.  HENT:  Negative for congestion, ear pain, rhinorrhea, sneezing and sore throat.   Eyes: Negative.  Negative for pain and redness.  Respiratory: Positive for shortness of breath. Negative for cough and wheezing.   Cardiovascular: Positive for leg swelling. Negative for chest pain.  Gastrointestinal: Negative for nausea, abdominal pain, diarrhea, constipation and blood in stool.    Endocrine: Negative for polydipsia and polyphagia.  Genitourinary: Positive for frequency and enuresis. Negative for dysuria, hematuria, flank pain, vaginal bleeding, vaginal discharge and pelvic pain.  Musculoskeletal: Positive for myalgias and joint swelling. Negative for back pain, arthralgias and gait problem.  Skin: Negative for rash.  Neurological: Negative.  Negative for dizziness, tremors, seizures, weakness, light-headedness, numbness and headaches.  Hematological: Negative for adenopathy.  Psychiatric/Behavioral: Negative.  Negative for behavioral problems, confusion and dysphoric mood. The patient is not nervous/anxious and is not hyperactive.     Social History   Social History  . Marital Status: Married    Spouse Name: N/A  . Number of Children: 2  . Years of Education: N/A   Occupational History  . Retired    Social History Main Topics  . Smoking status: Never Smoker   . Smokeless tobacco: Never Used  . Alcohol Use: No  . Drug Use: No  . Sexual Activity: Not on file   Other Topics Concern  . Not on file   Social History Narrative    Patient Active Problem List   Diagnosis Date Noted  . Vertigo 05/13/2015  . Senile purpura ([place]) 05/13/2015  . Cerebrovascular disease 03/26/2015  . Degenerative disc disease, lumbar 03/26/2015  . Obesity 03/26/2015  . Hyperlipidemia, mixed 03/26/2015  . Hypertension 03/26/2015  . Pre-diabetes 03/26/2015  . History of left breast cancer 08/21/2014  . Lobular carcinoma in situ 09/26/2013  . Breast neoplasm, Tis (LCIS) 09/12/2013  . Breast microcalcification, mammographic 07/26/2013  . Malignant neoplasm of ascending colon ([place]) 09/30/2006    Past Surgical History  Procedure Laterality Date  . Appendectomy  1995  . Ovarian cyst removal  1980  . Colonoscopy  August 03, 2012    8 mm tubulovillous adenoma removed from the transverse colon. Followup exam in 2018 plan.  . Replacement total knee Right 12/2010  . Breast  biopsy Right August 07, 2010    Sclerotic fibroadenoma removed from the right breast at the 10:00 position  . Breast surgery Left 09/05/13    LCIS, ER 90%, PR 60%, negative margins.  . Lumbar disc surgery  2009    L4-5 microdiscectomy Dr. Mauri Pole  . Doppler echocardiography  09/17/2010    EF 55%, Mild to moderate MR and mild to moderate TR  . Heel spur excision Left 2005  . Colon surgery  2007    right hemicolectomy. Dr. Bary Castilla    Her family history includes Brain cancer in her father; Epilepsy in her other; Gastric cancer in her father; Heart disease in her other; Hypertension in her sister; Ulcers in her other.    Previous Medications   AMLODIPINE (NORVASC) 10 MG TABLET    Take 1 tablet by mouth daily.   LOVASTATIN (MEVACOR) 40 MG TABLET    Take 1 tablet (40 mg total) by mouth at bedtime.   MECLIZINE (ANTIVERT) 25 MG TABLET    Take 1 tablet (25 mg total) by mouth 3 (three) times daily as needed for dizziness.   PROMETHAZINE (PHENERGAN) 25 MG TABLET    Take 1 tablet (25 mg total) by mouth every 8 (eight) hours as needed for nausea or  vomiting.   TAMOXIFEN (NOLVADEX) 20 MG TABLET    Take 1 tablet (20 mg total) by mouth daily.    Patient Care Team: Birdie Sons, MD as PCP - General (Family Medicine) Robert Bellow, MD (General Surgery) Birdie Sons, MD as Referring Physician (Family Medicine)     Objective:   Vitals: BP 148/74 mmHg  Pulse 73  Temp(Src) 98.5 F (36.9 C) (Oral)  Resp 16  Ht 5' 5.5" (1.664 m)  Wt 234 lb (106.142 kg)  BMI 38.33 kg/m2  SpO2 96%  Physical Exam   General Appearance:    Alert, cooperative, no distress, appears stated age, obese  Head:    Normocephalic, without obvious abnormality, atraumatic  Eyes:    PERRL, conjunctiva/corneas clear, EOM's intact, fundi    benign, both eyes  Ears:    Normal TM's and external ear canals, both ears  Nose:   Nares normal, septum midline, mucosa normal, no drainage    or sinus tenderness  Throat:    Lips, mucosa, and tongue normal; teeth and gums normal  Neck:   Supple, symmetrical, trachea midline, no adenopathy;    thyroid:  no enlargement/tenderness/nodules; no carotid   bruit or JVD  Back:     Symmetric, no curvature, ROM normal, no CVA tenderness  Lungs:     Clear to auscultation bilaterally, respirations unlabored  Chest Wall:    No tenderness or deformity   Heart:    Regular rate and rhythm, S1 and S2 normal, no murmur, rub   or gallop  Breast Exam:    deferred  Abdomen:     Soft, non-tender, bowel sounds active all four quadrants,    no masses, no organomegaly  Pelvic:    cervix normal in appearance and vagina normal without discharge  Extremities:   Extremities normal, atraumatic, no cyanosis or edema  Pulses:   2+ and symmetric all extremities  Skin:   Skin color, texture, turgor normal, no rashes or lesions  Lymph nodes:   Cervical, supraclavicular, and axillary nodes normal  Neurologic:   CNII-XII intact, normal strength, sensation and reflexes    throughout    Activities of Daily Living In your present state of health, do you have any difficulty performing the following activities: 11/18/2015  Hearing? N  Vision? N  Difficulty concentrating or making decisions? N  Walking or climbing stairs? N  Dressing or bathing? N  Doing errands, shopping? N    Fall Risk Assessment Fall Risk  11/18/2015  Falls in the past year? No     Depression Screen PHQ 2/9 Scores 11/18/2015  PHQ - 2 Score 0    Cognitive Testing - 6-CIT  Correct? Score   What year is it? yes 0 0 or 4  What month is it? yes 0 0 or 3  Memorize:    Pia Mau,  42,  [address],  [place],      What time is it? (within 1 hour) yes 0 0 or 3  Count backwards from 20 yes 0 0, 2, or 4  Name the months of the year yes 0 0, 2, or 4  Repeat name & address above yes 0 0, 2, 4, 6, 8, or 10       TOTAL SCORE  0/28   Interpretation:  Normal  Normal (0-7) Abnormal (8-28)   Audit-C Alcohol Use  Screening  Question Answer Points  How often do you have alcoholic drink? never 0  On days you do drink  alcohol, how many drinks do you typically consume? 0 0  How oftey will you drink 6 or more in a total? never 0  Total Score:  0   A score of 3 or more in women, and 4 or more in men indicates increased risk for alcohol abuse, EXCEPT if all of the points are from question 1.     Assessment & Plan:    Annual Physical Reviewed patient's Family Medical History Reviewed and updated list of patient's medical providers Assessment of cognitive impairment was done Assessed patient's functional ability Established a written schedule for health screening [place] Completed and Reviewed  Exercise Activities and Dietary recommendations Goals    None      Immunization History  Administered Date(s) Administered  . Pneumococcal Conjugate-13 11/12/2014  . Pneumococcal Polysaccharide-23 01/11/2012  . Tdap 04/06/2007    Health Maintenance  Topic Date Due  . Hepatitis C Screening  [birth year]  . ZOSTAVAX  12/05/2005  . DEXA SCAN  12/05/2010  . INFLUENZA VACCINE  07/01/2015  . TETANUS/TDAP  04/05/2017  . MAMMOGRAM  06/18/2017  . COLONOSCOPY  08/03/2022  . PNA vac Low Risk Adult  Completed      Discussed health benefits of physical activity, and encouraged her to engage in regular exercise appropriate for her age and condition.    ------------------------------------------------------------------------------------------------------------  1. Annual physical exam Doing very well.   2. Pre-diabetes  - Hemoglobin A1c  3. Essential hypertension Stable. Continue current medications.   - EKG 12-Lead - Renal function panel  4. Hyperlipidemia, mixed She is tolerating lovastatin well with no adverse effects.   - Lipid panel - ALT  5. Estrogen deficiency  - DG Bone Density; Future  6. Screening for cervical cancer Counseled or recommendations to  consider discontinuation of pap screening after the age of 69yo. She reports never having had an abnormal pap.  - Pap IG (Image Guided)

## 2015-11-19 LAB — LIPID PANEL
CHOLESTEROL TOTAL: 145 mg/dL (ref 100–199)
Chol/HDL Ratio: 3.7 ratio units (ref 0.0–4.4)
HDL: 39 mg/dL — AB (ref >39)
LDL Calculated: 68 mg/dL (ref 0–99)
TRIGLYCERIDES: 192 mg/dL — AB (ref 0–149)
VLDL Cholesterol Cal: 38 mg/dL (ref 5–40)

## 2015-11-19 LAB — RENAL FUNCTION PANEL
Albumin: 4 g/dL (ref 3.6–4.8)
BUN/Creatinine Ratio: 14 (ref 11–26)
BUN: 13 mg/dL (ref 8–27)
CO2: 26 mmol/L (ref 18–29)
Calcium: 9.3 mg/dL (ref 8.7–10.3)
Chloride: 103 mmol/L (ref 96–106)
Creatinine, Ser: 0.92 mg/dL (ref 0.57–1.00)
GFR calc Af Amer: 73 mL/min/{1.73_m2} (ref >59)
GFR calc non Af Amer: 64 mL/min/{1.73_m2} (ref >59)
GLUCOSE: 116 mg/dL — AB (ref 65–99)
PHOSPHORUS: 3.3 mg/dL (ref 2.5–4.5)
POTASSIUM: 4.2 mmol/L (ref 3.5–5.2)
Sodium: 142 mmol/L (ref 134–144)

## 2015-11-19 LAB — ALT: ALT: 13 IU/L (ref 0–32)

## 2015-11-19 LAB — HEMOGLOBIN A1C
Est. average glucose Bld gHb Est-mCnc: 134 mg/dL
Hgb A1c MFr Bld: 6.3 % — ABNORMAL HIGH (ref 4.8–5.6)

## 2015-11-20 LAB — PAP IG (IMAGE GUIDED): PAP Smear Comment: 0

## 2015-11-21 ENCOUNTER — Encounter: Payer: Self-pay | Admitting: Family Medicine

## 2015-11-21 DIAGNOSIS — K5900 Constipation, unspecified: Secondary | ICD-10-CM | POA: Insufficient documentation

## 2015-11-21 DIAGNOSIS — J309 Allergic rhinitis, unspecified: Secondary | ICD-10-CM | POA: Insufficient documentation

## 2015-11-21 DIAGNOSIS — D649 Anemia, unspecified: Secondary | ICD-10-CM | POA: Insufficient documentation

## 2015-11-21 DIAGNOSIS — Z87442 Personal history of urinary calculi: Secondary | ICD-10-CM | POA: Insufficient documentation

## 2015-11-21 DIAGNOSIS — Z8669 Personal history of other diseases of the nervous system and sense organs: Secondary | ICD-10-CM | POA: Insufficient documentation

## 2015-11-21 DIAGNOSIS — M1990 Unspecified osteoarthritis, unspecified site: Secondary | ICD-10-CM | POA: Insufficient documentation

## 2015-11-28 ENCOUNTER — Ambulatory Visit
Admission: RE | Admit: 2015-11-28 | Discharge: 2015-11-28 | Disposition: A | Discharge status: Home / Self Care | Payer: Medicare Other | Source: Ambulatory Visit | Attending: Family Medicine | Admitting: Family Medicine

## 2015-11-28 DIAGNOSIS — Z780 Asymptomatic menopausal state: Secondary | ICD-10-CM | POA: Insufficient documentation

## 2015-11-28 DIAGNOSIS — E2839 Other primary ovarian failure: Secondary | ICD-10-CM

## 2015-12-02 ENCOUNTER — Other Ambulatory Visit: Payer: Self-pay | Admitting: Family Medicine

## 2015-12-04 ENCOUNTER — Other Ambulatory Visit: Payer: Self-pay | Admitting: General Surgery

## 2016-01-20 ENCOUNTER — Encounter: Payer: Self-pay | Admitting: Family Medicine

## 2016-01-20 ENCOUNTER — Ambulatory Visit (INDEPENDENT_AMBULATORY_CARE_PROVIDER_SITE_OTHER): Payer: Medicare Other | Admitting: Family Medicine

## 2016-01-20 VITALS — BP 160/90 | HR 78 | Temp 98.2°F | Resp 16 | Wt 232.0 lb

## 2016-01-20 DIAGNOSIS — R1084 Generalized abdominal pain: Secondary | ICD-10-CM

## 2016-01-20 DIAGNOSIS — N393 Stress incontinence (female) (male): Secondary | ICD-10-CM

## 2016-01-20 DIAGNOSIS — R194 Change in bowel habit: Secondary | ICD-10-CM | POA: Diagnosis not present

## 2016-01-20 LAB — POCT URINALYSIS DIPSTICK
BILIRUBIN UA: NEGATIVE
Glucose, UA: NEGATIVE
KETONES UA: NEGATIVE
LEUKOCYTES UA: NEGATIVE
Nitrite, UA: NEGATIVE
SPEC GRAV UA: 1.025
Urobilinogen, UA: 0.2
pH, UA: 6

## 2016-01-20 NOTE — Progress Notes (Signed)
Patient: Lindsay Russo Female    DOB: Nov 26, 1946   70 y.o.   MRN: [id number] Visit Date: 01/20/2016  Today's Provider: Lelon Huh, MD   Chief Complaint  Patient presents with  . Abdominal Pain   Subjective:    Abdominal Pain This is a new problem. Episode onset: 1 month ago. The problem occurs intermittently. The problem has been unchanged. The pain is located in the epigastric region. The quality of the pain is dull and aching. Associated symptoms include diarrhea, headaches and nausea. Pertinent negatives include no anorexia, arthralgias, belching, constipation, dysuria, fever, flatus, frequency, hematochezia, hematuria, melena, myalgias, vomiting or weight loss. Associated symptoms comments: Change in bowel habits; has several bowel movements everyday with the consistency ranging from normal to loose. The pain is aggravated by eating.  Patient states she has been fatigued and has dry heaves.   She has also been having urine leakage when coughing and bending. Rarely has sudden urges to urinate. Has been doing Kegel's.     Wt Readings from Last 3 Encounters:  01/20/16 232 lb (105.235 kg)  11/18/15 234 lb (106.142 kg)  08/12/15 234 lb (106.142 kg)        Allergies  Allergen Reactions  . Tegaderm Ag Mesh [Silver] Other (See Comments)    blisters  . Amoxicillin Rash  . Sulfa Antibiotics Rash   Previous Medications   AMLODIPINE (NORVASC) 10 MG TABLET    Take 1 tablet by mouth daily.   LOVASTATIN (MEVACOR) 40 MG TABLET    TAKE ONE TABLET AT BEDTIME   MECLIZINE (ANTIVERT) 25 MG TABLET    Take 1 tablet (25 mg total) by mouth 3 (three) times daily as needed for dizziness.   PROMETHAZINE (PHENERGAN) 25 MG TABLET    Take 1 tablet (25 mg total) by mouth every 8 (eight) hours as needed for nausea or vomiting.   TAMOXIFEN (NOLVADEX) 20 MG TABLET    TAKE ONE (1) TABLET BY MOUTH EVERY DAY    Review of Systems  Constitutional: Positive for fatigue. Negative for fever,  chills, weight loss and appetite change.  Respiratory: Negative for chest tightness and shortness of breath.   Cardiovascular: Negative for chest pain and palpitations.  Gastrointestinal: Positive for nausea, abdominal pain and diarrhea. Negative for vomiting, constipation, blood in stool, melena, hematochezia, abdominal distention, anorexia and flatus.       Change in bowel habits  Genitourinary: Negative for dysuria, frequency and hematuria.  Musculoskeletal: Negative for myalgias and arthralgias.  Neurological: Positive for headaches. Negative for dizziness and weakness.    Social History  Substance Use Topics  . Smoking status: Never Smoker   . Smokeless tobacco: Never Used  . Alcohol Use: No   Objective:   BP 160/90 mmHg  Pulse 78  Temp(Src) 98.2 F (36.8 C) (Oral)  Resp 16  Wt 232 lb (105.235 kg)  SpO2 97%  Physical Exam   General Appearance:    Alert, cooperative, no distress  Eyes:    PERRL, conjunctiva/corneas clear, EOM's intact       Lungs:     Clear to auscultation bilaterally, respirations unlabored  Heart:    Regular rate and rhythm  Neurologic:   Awake, alert, oriented x 3. No apparent focal neurological           defect.   Abd:   Mild periumbilical tenderness, no rebound, no guarding, no masses. No CVAT   Results for orders placed or performed in visit on 01/20/16  POCT urinalysis dipstick  Result Value Ref Range   Color, UA yellow    Clarity, UA clear    Glucose, UA negative    Bilirubin, UA negative    Ketones, UA negative    Spec Grav, UA 1.025    Blood, UA trace    pH, UA 6.0    Protein, UA ++100    Urobilinogen, UA 0.2    Nitrite, UA negative    Leukocytes, UA Negative Negative        Assessment & Plan:     1. Generalized abdominal pain Consider ultrasound if labs are unremarkable.  - POCT urinalysis dipstick - CBC - Amylase - Comprehensive metabolic panel - H Pylori, IGM, IGG, IGA AB - TSH  2. Bowel habit changes Is due for  colonoscopy in 2018 - CBC - Amylase - Comprehensive metabolic panel - H Pylori, IGM, IGG, IGA AB - TSH  3. Stress incontinence No improvement with Kegels. Not interested in surgical intervention at this point.        Lelon Huh, MD  [facility]

## 2016-01-23 ENCOUNTER — Telehealth: Payer: Self-pay | Admitting: Family Medicine

## 2016-01-23 ENCOUNTER — Telehealth: Payer: Self-pay | Admitting: *Deleted

## 2016-01-23 LAB — H PYLORI, IGM, IGG, IGA AB: H. PYLORI, IGA ABS: 10.5 U — AB (ref 0.0–8.9)

## 2016-01-23 LAB — CBC
HEMATOCRIT: 41.7 % (ref 34.0–46.6)
HEMOGLOBIN: 13.9 g/dL (ref 11.1–15.9)
MCH: 29 pg (ref 26.6–33.0)
MCHC: 33.3 g/dL (ref 31.5–35.7)
MCV: 87 fL (ref 79–97)
Platelets: 203 10*3/uL (ref 150–379)
RBC: 4.79 x10E6/uL (ref 3.77–5.28)
RDW: 13.8 % (ref 12.3–15.4)
WBC: 11.2 10*3/uL — AB (ref 3.4–10.8)

## 2016-01-23 LAB — COMPREHENSIVE METABOLIC PANEL
ALK PHOS: 93 IU/L (ref 39–117)
ALT: 12 IU/L (ref 0–32)
AST: 20 IU/L (ref 0–40)
Albumin/Globulin Ratio: 1.4 (ref 1.1–2.5)
Albumin: 4 g/dL (ref 3.5–4.8)
BILIRUBIN TOTAL: 0.2 mg/dL (ref 0.0–1.2)
BUN/Creatinine Ratio: 16 (ref 11–26)
BUN: 15 mg/dL (ref 8–27)
CHLORIDE: 105 mmol/L (ref 96–106)
CO2: 23 mmol/L (ref 18–29)
CREATININE: 0.92 mg/dL (ref 0.57–1.00)
Calcium: 9.7 mg/dL (ref 8.7–10.3)
GFR calc Af Amer: 73 mL/min/{1.73_m2} (ref >59)
GFR calc non Af Amer: 63 mL/min/{1.73_m2} (ref >59)
GLUCOSE: 103 mg/dL — AB (ref 65–99)
Globulin, Total: 2.9 g/dL (ref 1.5–4.5)
Potassium: 4.9 mmol/L (ref 3.5–5.2)
Sodium: 148 mmol/L — ABNORMAL HIGH (ref 134–144)
Total Protein: 6.9 g/dL (ref 6.0–8.5)

## 2016-01-23 LAB — TSH: TSH: 3.85 u[IU]/mL (ref 0.450–4.500)

## 2016-01-23 LAB — AMYLASE: AMYLASE: 92 U/L (ref 31–124)

## 2016-01-23 MED ORDER — OMEPRAZOLE 20 MG PO CPDR: 20.0000 mg | DELAYED_RELEASE_CAPSULE | Freq: Two times a day (BID) | ORAL | Status: DC

## 2016-01-23 MED ORDER — METRONIDAZOLE 500 MG PO TABS: 500.0000 mg | ORAL_TABLET | Freq: Two times a day (BID) | ORAL | Status: DC

## 2016-01-23 MED ORDER — CLARITHROMYCIN 500 MG PO TABS: 500.0000 mg | ORAL_TABLET | Freq: Two times a day (BID) | ORAL | Status: DC

## 2016-01-23 NOTE — Telephone Encounter (Signed)
-----   Message from Birdie Sons, MD sent at 01/23/2016  7:51 AM EST ----- H. Pylori test is positive. Start clarithromycin 500mg  twice a day for 10 days, metronidazole 500mg  twice a day for 10 days, and omeprazole 20mg  capsules one twice a day for 10 days. Follow up o.v. In 2 weeks.

## 2016-01-23 NOTE — Telephone Encounter (Signed)
Patient was notified of results. Patient expressed understanding. Rx's sent to pharmacy.  

## 2016-01-23 NOTE — Telephone Encounter (Signed)
Pt would like Sharyn Lull to return her call. Pt stated that she has a question about her lab results that she discussed with Sharyn Lull. Thanks TNP

## 2016-01-24 NOTE — Telephone Encounter (Signed)
Returned pt's call. Patient wanted more information about her lab results.

## 2016-02-05 ENCOUNTER — Other Ambulatory Visit: Payer: Self-pay | Admitting: Family Medicine

## 2016-02-10 ENCOUNTER — Ambulatory Visit (INDEPENDENT_AMBULATORY_CARE_PROVIDER_SITE_OTHER): Payer: Medicare Other | Admitting: Family Medicine

## 2016-02-10 ENCOUNTER — Encounter: Payer: Self-pay | Admitting: Family Medicine

## 2016-02-10 VITALS — BP 162/70 | HR 68 | Temp 98.5°F | Resp 18 | Wt 232.0 lb

## 2016-02-10 DIAGNOSIS — B9681 Helicobacter pylori [H. pylori] as the cause of diseases classified elsewhere: Secondary | ICD-10-CM

## 2016-02-10 DIAGNOSIS — K2970 Gastritis, unspecified, without bleeding: Principal | ICD-10-CM

## 2016-02-10 DIAGNOSIS — R1084 Generalized abdominal pain: Secondary | ICD-10-CM | POA: Diagnosis not present

## 2016-02-10 NOTE — Progress Notes (Signed)
       Patient: Lindsay Russo Female    DOB: 1946/03/23   70 y.o.   MRN: [id number] Visit Date: 02/10/2016  Today's Provider: Lelon Huh, MD   Chief Complaint  Patient presents with  . Follow-up    H. Pylori   Subjective:    HPI Follow up H. Pylori:  Patient was last seen 01/20/2016 for symptoms of abdominal pain and bowel habit changes. Labs were ordered and patient tested positive for H. Pylori. Patient was treated with Clarithromycin, Metronidazole and Omeprazole. Patient comes in today reporting that she has completed all doses of treatment and symptoms have completly resolved.     Allergies  Allergen Reactions  . Tegaderm Ag Mesh [Silver] Other (See Comments)    blisters  . Amoxicillin Rash  . Sulfa Antibiotics Rash   Previous Medications   AMLODIPINE (NORVASC) 10 MG TABLET    TAKE ONE (1) TABLET BY MOUTH EVERY DAY   LOVASTATIN (MEVACOR) 40 MG TABLET    TAKE ONE TABLET AT BEDTIME   MECLIZINE (ANTIVERT) 25 MG TABLET    Take 1 tablet (25 mg total) by mouth 3 (three) times daily as needed for dizziness.   PROMETHAZINE (PHENERGAN) 25 MG TABLET    Take 1 tablet (25 mg total) by mouth every 8 (eight) hours as needed for nausea or vomiting.   TAMOXIFEN (NOLVADEX) 20 MG TABLET    TAKE ONE (1) TABLET BY MOUTH EVERY DAY    Review of Systems  Constitutional: Negative for fever, chills, appetite change and fatigue.  Respiratory: Negative for chest tightness and shortness of breath.   Cardiovascular: Negative for chest pain and palpitations.  Gastrointestinal: Negative for nausea, vomiting and abdominal pain.  Neurological: Negative for dizziness and weakness.    Social History  Substance Use Topics  . Smoking status: Never Smoker   . Smokeless tobacco: Never Used  . Alcohol Use: No   Objective:   BP 162/70 mmHg  Pulse 68  Temp(Src) 98.5 F (36.9 C) (Oral)  Resp 18  Wt 232 lb (105.235 kg)  SpO2 98%  Physical Exam  General appearance: alert, well developed, well  nourished, cooperative and in no distress Head: Normocephalic, without obvious abnormality, atraumatic Lungs: Respirations even and unlabored Extremities: No gross deformities Skin: Skin color, texture, turgor normal. No rashes seen  Psych: Appropriate mood and affect. Neurologic: Mental status: Alert, oriented to person, place, and time, thought content appropriate.     Assessment & Plan:     1. Helicobacter pylori gastritis Completed triple thearpy - H. pylori breath test  2. Generalized abdominal pain Resolved since completing treatment for h. Pylori.  - H. pylori breath test       Lelon Huh, MD  [facility]

## 2016-02-12 LAB — H. PYLORI BREATH TEST: H. PYLORI UBIT: NEGATIVE

## 2016-06-23 ENCOUNTER — Encounter: Payer: Self-pay | Admitting: *Deleted

## 2016-07-01 ENCOUNTER — Ambulatory Visit (INDEPENDENT_AMBULATORY_CARE_PROVIDER_SITE_OTHER): Payer: Medicare Other | Admitting: Family Medicine

## 2016-07-01 ENCOUNTER — Encounter: Payer: Self-pay | Admitting: Family Medicine

## 2016-07-01 VITALS — BP 188/90 | HR 75 | Temp 98.2°F | Resp 16 | Wt 229.0 lb

## 2016-07-01 DIAGNOSIS — N390 Urinary tract infection, site not specified: Secondary | ICD-10-CM

## 2016-07-01 DIAGNOSIS — D649 Anemia, unspecified: Secondary | ICD-10-CM

## 2016-07-01 DIAGNOSIS — M25562 Pain in left knee: Secondary | ICD-10-CM | POA: Diagnosis not present

## 2016-07-01 DIAGNOSIS — I10 Essential (primary) hypertension: Secondary | ICD-10-CM | POA: Diagnosis not present

## 2016-07-01 DIAGNOSIS — R7303 Prediabetes: Secondary | ICD-10-CM

## 2016-07-01 DIAGNOSIS — R601 Generalized edema: Secondary | ICD-10-CM | POA: Diagnosis not present

## 2016-07-01 LAB — POCT URINALYSIS DIPSTICK
Bilirubin, UA: NEGATIVE
Glucose, UA: NEGATIVE
KETONES UA: NEGATIVE
Nitrite, UA: NEGATIVE
PH UA: 6
SPEC GRAV UA: 1.025
Urobilinogen, UA: 0.2

## 2016-07-01 MED ORDER — CIPROFLOXACIN HCL 500 MG PO TABS: 500.0000 mg | ORAL_TABLET | Freq: Two times a day (BID) | ORAL | 0 refills | Status: AC

## 2016-07-01 NOTE — Progress Notes (Signed)
Patient: Lindsay Russo Female    DOB: [birth year]   70 y.o.   MRN: [id number] Visit Date: 07/01/2016  Today's Provider: Lelon Huh, MD   Chief Complaint  Patient presents with  . Leg Pain   Subjective:    Leg Pain   Incident onset: onset a couple of months ago. There was no injury mechanism. The pain is present in the left leg and right leg. The quality of the pain is described as cramping (also a throbbing pain). The pain has been constant since onset. Pertinent negatives include no inability to bear weight, loss of motion, loss of sensation, muscle weakness, numbness or tingling. Exacerbated by: walking. She has tried elevation for the symptoms. The treatment provided mild relief.   She has been followed by [facility] in the past and had right knee replacement and was told that she would likely knee left knee replaced at some point.   She also states she has been very fatigued, is up several times a night to void. Has also been having swelling in both legs for the last month which make her legs sore.     Allergies  Allergen Reactions  . Tegaderm Ag Mesh [Silver] Other (See Comments)    blisters  . Amoxicillin Rash  . Sulfa Antibiotics Rash   Current Meds  Medication Sig  . amLODipine (NORVASC) 10 MG tablet TAKE ONE (1) TABLET BY MOUTH EVERY DAY  . lovastatin (MEVACOR) 40 MG tablet TAKE ONE TABLET AT BEDTIME  . meclizine (ANTIVERT) 25 MG tablet Take 1 tablet (25 mg total) by mouth 3 (three) times daily as needed for dizziness.  . promethazine (PHENERGAN) 25 MG tablet Take 1 tablet (25 mg total) by mouth every 8 (eight) hours as needed for nausea or vomiting.  . tamoxifen (NOLVADEX) 20 MG tablet TAKE ONE (1) TABLET BY MOUTH EVERY DAY    Review of Systems  Constitutional: Positive for diaphoresis. Negative for appetite change, chills, fatigue and fever.  Respiratory: Negative for chest tightness and shortness of breath.   Cardiovascular: Positive for leg swelling. Negative  for chest pain and palpitations.  Gastrointestinal: Negative for abdominal pain, nausea and vomiting.  Genitourinary: Positive for frequency.       Urinary incontinence   Musculoskeletal: Positive for arthralgias, joint swelling and myalgias (lower leg pain).  Skin: Positive for rash (left foot).  Neurological: Negative for dizziness, tingling, weakness and numbness.    Social History  Substance Use Topics  . Smoking status: Never Smoker  . Smokeless tobacco: Never Used  . Alcohol use No   Objective:   BP (!) 188/90 (BP Location: Left Arm, Patient Position: Sitting, Cuff Size: Large)   Pulse 75   Temp 98.2 F (36.8 C) (Oral)   Resp 16   Wt 229 lb (103.9 kg)   SpO2 96% Comment: room air  BMI 37.53 kg/m   Physical Exam   General Appearance:    Alert, cooperative, no distress  Eyes:    PERRL, conjunctiva/corneas clear, EOM's intact       Lungs:     Clear to auscultation bilaterally, respirations unlabored  Heart:    Regular rate and rhythm. 1+ bilateral LE edema. 2+ Pedal pulses  Neurologic:   Awake, alert, oriented x 3. No apparent focal neurological           defect.   MS:   Slightly swollen left knee and tender along joint lines.    Results for orders placed or  performed in visit on 07/01/16  POCT Urinalysis Dipstick  Result Value Ref Range   Color, UA yellow    Clarity, UA clear    Glucose, UA negative    Bilirubin, UA negative    Ketones, UA negative    Spec Grav, UA 1.025    Blood, UA Trace (hemolyzed)    pH, UA 6.0    Protein, UA 100 (++)    Urobilinogen, UA 0.2    Nitrite, UA negative    Leukocytes, UA small (1+) (A) Negative       Assessment & Plan:     1. Urinary tract infection without hematuria, site unspecified  - POCT Urinalysis Dipstick - Urine culture  2. Generalized edema Multifactorial likely combination of being on CCB and warm temperatures. Consider adding diuretic.  - Comprehensive metabolic panel  3. Essential  hypertension Uncontrolled. Would likely benefit from addition of diuretic such as chlorthalidone or spironolactone  4. Anemia, unspecified anemia type Has been more fatigues lately.  - CBC  5. Pre-diabetes Increased nocturia lately. Due to check A1c.  - Hemoglobin A1c  6. Nocturia Cover with Cipro while awaiting culture results. Consider adding anticholinergic  7. Left knee pain Followed by Dr. Marry Guan in the past.  - Ambulatory referral to [place], MD  [facility]

## 2016-07-02 ENCOUNTER — Other Ambulatory Visit: Payer: Self-pay | Admitting: Emergency Medicine

## 2016-07-02 DIAGNOSIS — R601 Generalized edema: Secondary | ICD-10-CM

## 2016-07-02 DIAGNOSIS — I10 Essential (primary) hypertension: Secondary | ICD-10-CM

## 2016-07-02 LAB — COMPREHENSIVE METABOLIC PANEL
A/G RATIO: 1.5 (ref 1.2–2.2)
ALK PHOS: 93 IU/L (ref 39–117)
ALT: 15 IU/L (ref 0–32)
AST: 24 IU/L (ref 0–40)
Albumin: 4.1 g/dL (ref 3.5–4.8)
BILIRUBIN TOTAL: 0.3 mg/dL (ref 0.0–1.2)
BUN/Creatinine Ratio: 14 (ref 12–28)
BUN: 13 mg/dL (ref 8–27)
CHLORIDE: 102 mmol/L (ref 96–106)
CO2: 25 mmol/L (ref 18–29)
Calcium: 9.3 mg/dL (ref 8.7–10.3)
Creatinine, Ser: 0.94 mg/dL (ref 0.57–1.00)
GFR calc non Af Amer: 62 mL/min/{1.73_m2} (ref >59)
GFR, EST AFRICAN AMERICAN: 71 mL/min/{1.73_m2} (ref >59)
GLUCOSE: 124 mg/dL — AB (ref 65–99)
Globulin, Total: 2.7 g/dL (ref 1.5–4.5)
POTASSIUM: 4.6 mmol/L (ref 3.5–5.2)
Sodium: 142 mmol/L (ref 134–144)
TOTAL PROTEIN: 6.8 g/dL (ref 6.0–8.5)

## 2016-07-02 LAB — HEMOGLOBIN A1C
Est. average glucose Bld gHb Est-mCnc: 128 mg/dL
HEMOGLOBIN A1C: 6.1 % — AB (ref 4.8–5.6)

## 2016-07-02 LAB — CBC
Hematocrit: 43.1 % (ref 34.0–46.6)
Hemoglobin: 14.5 g/dL (ref 11.1–15.9)
MCH: 29.6 pg (ref 26.6–33.0)
MCHC: 33.6 g/dL (ref 31.5–35.7)
MCV: 88 fL (ref 79–97)
PLATELETS: 211 10*3/uL (ref 150–379)
RBC: 4.9 x10E6/uL (ref 3.77–5.28)
RDW: 14 % (ref 12.3–15.4)
WBC: 10.7 10*3/uL (ref 3.4–10.8)

## 2016-07-02 MED ORDER — CHLORTHALIDONE 25 MG PO TABS: 25.0000 mg | ORAL_TABLET | Freq: Every day | ORAL | 1 refills | Status: DC

## 2016-07-03 LAB — URINE CULTURE

## 2016-07-13 ENCOUNTER — Ambulatory Visit: Payer: Medicare Other | Admitting: Family Medicine

## 2016-07-14 DIAGNOSIS — M25562 Pain in left knee: Secondary | ICD-10-CM | POA: Diagnosis not present

## 2016-07-14 DIAGNOSIS — M1712 Unilateral primary osteoarthritis, left knee: Secondary | ICD-10-CM | POA: Diagnosis not present

## 2016-07-30 ENCOUNTER — Encounter: Payer: Self-pay | Admitting: Family Medicine

## 2016-07-30 ENCOUNTER — Ambulatory Visit (INDEPENDENT_AMBULATORY_CARE_PROVIDER_SITE_OTHER): Payer: Medicare Other | Admitting: Family Medicine

## 2016-07-30 VITALS — BP 150/68 | HR 71 | Temp 98.7°F | Resp 16 | Ht 66.0 in | Wt 232.0 lb

## 2016-07-30 DIAGNOSIS — I10 Essential (primary) hypertension: Secondary | ICD-10-CM

## 2016-07-30 NOTE — Progress Notes (Signed)
Patient: Lindsay Russo Female    DOB: Nov 08, 1946   70 y.o.   MRN: [id number] Visit Date: 07/30/2016  Today's Provider: Lelon Huh, MD   Chief Complaint  Patient presents with  . Follow-up  . Hypertension   Subjective:    HPI     Hypertension, follow-up:  BP Readings from Last 3 Encounters:  07/30/16 (!) 150/68  07/01/16 (!) 188/90  02/10/16 (!) 162/70    She was last seen for hypertension 4 weeks ago.  BP at that visit was 188/90. Management since that visit includes; started chlorthalidone 25 mg qd, to help with swelling and Bp.She reports good compliance with treatment. She is not having side effects. none She is exercising. She is adherent to low salt diet.   Outside blood pressures are 140/80. She is experiencing none.  Patient denies none.   Cardiovascular risk factors include none.  Use of agents associated with hypertension: none.   ----------------------------------------------------------------  She had follow up with ortho a few weeks ago and anticipates having knee replacement in the next couple of months. She has follow up with Dr. Marry Guan on the 26th.   Allergies  Allergen Reactions  . Tegaderm Ag Mesh [Silver] Other (See Comments)    blisters  . Amoxicillin Rash  . Sulfa Antibiotics Rash   Current Meds  Medication Sig  . amLODipine (NORVASC) 10 MG tablet TAKE ONE (1) TABLET BY MOUTH EVERY DAY  . chlorthalidone (HYGROTON) 25 MG tablet Take 1 tablet (25 mg total) by mouth daily.  [name] lovastatin (MEVACOR) 40 MG tablet TAKE ONE TABLET AT BEDTIME  . meclizine (ANTIVERT) 25 MG tablet Take 1 tablet (25 mg total) by mouth 3 (three) times daily as needed for dizziness.  . promethazine (PHENERGAN) 25 MG tablet Take 1 tablet (25 mg total) by mouth every 8 (eight) hours as needed for nausea or vomiting.  . tamoxifen (NOLVADEX) 20 MG tablet TAKE ONE (1) TABLET BY MOUTH EVERY DAY    Review of Systems  Constitutional: Negative for appetite change,  chills, fatigue and fever.  Respiratory: Negative for chest tightness and shortness of breath.   Cardiovascular: Negative for chest pain and palpitations.  Gastrointestinal: Negative for abdominal pain, nausea and vomiting.  Neurological: Negative for dizziness and weakness.    Social History  Substance Use Topics  . Smoking status: Never Smoker  . Smokeless tobacco: Never Used  . Alcohol use No   Objective:   BP (!) 150/68 (BP Location: Right Arm, Patient Position: Sitting, Cuff Size: Large)   Pulse 71   Temp 98.7 F (37.1 C) (Oral)   Resp 16   Ht 5\' 6"  (1.676 m)   Wt 232 lb (105.2 kg)   SpO2 96%   BMI 37.45 kg/m   Physical Exam   General Appearance:    Alert, cooperative, no distress  Eyes:    PERRL, conjunctiva/corneas clear, EOM's intact       Lungs:     Clear to auscultation bilaterally, respirations unlabored  Heart:    Regular rate and rhythm  Neurologic:   Awake, alert, oriented x 3. No apparent focal neurological           defect.           Assessment & Plan:     1. Essential hypertension Improved since starting chlorthalidone - Renal function panel  Would like to see her BP below 140, but is improving. She is low risk for surgical if her electrolytes  are normal and SBP remains 150 or lower. Advised her to call if her BP is above this  Before surgery and we can increase chlorthalidone if necessary. Otherwise follow up 4 months.  She states she gets her flu shot at [facility].        Lelon Huh, MD  [facility]

## 2016-07-31 ENCOUNTER — Telehealth: Payer: Self-pay

## 2016-07-31 DIAGNOSIS — R920 Mammographic microcalcification found on diagnostic imaging of breast: Secondary | ICD-10-CM | POA: Diagnosis not present

## 2016-07-31 DIAGNOSIS — R922 Inconclusive mammogram: Secondary | ICD-10-CM | POA: Diagnosis not present

## 2016-07-31 DIAGNOSIS — Z853 Personal history of malignant neoplasm of breast: Secondary | ICD-10-CM | POA: Diagnosis not present

## 2016-07-31 LAB — RENAL FUNCTION PANEL
Albumin: 4.3 g/dL (ref 3.5–4.8)
BUN / CREAT RATIO: 20 (ref 12–28)
BUN: 17 mg/dL (ref 8–27)
CALCIUM: 9.1 mg/dL (ref 8.7–10.3)
CO2: 27 mmol/L (ref 18–29)
CREATININE: 0.86 mg/dL (ref 0.57–1.00)
Chloride: 102 mmol/L (ref 96–106)
GFR calc Af Amer: 79 mL/min/{1.73_m2} (ref >59)
GFR calc non Af Amer: 69 mL/min/{1.73_m2} (ref >59)
GLUCOSE: 124 mg/dL — AB (ref 65–99)
PHOSPHORUS: 3.5 mg/dL (ref 2.5–4.5)
POTASSIUM: 4.5 mmol/L (ref 3.5–5.2)
SODIUM: 142 mmol/L (ref 134–144)

## 2016-07-31 LAB — HM MAMMOGRAPHY: HM MAMMO: NORMAL (ref 0–4)

## 2016-07-31 NOTE — Telephone Encounter (Signed)
Left message to call back.    Notes Recorded by Birdie Sons, MD on 07/31/2016 at 8:22 AM EDT Electrolytes are all normal. Can continue current dose of chlorthalidone. Recommend she stop by office for BP check 1-2 weeks before her surgery to make sure her BP is still controlled.

## 2016-08-05 ENCOUNTER — Encounter: Payer: Self-pay | Admitting: General Surgery

## 2016-08-05 NOTE — Telephone Encounter (Signed)
Patient was notified of results. Patient expressed understanding. 

## 2016-08-11 ENCOUNTER — Encounter: Payer: Self-pay | Admitting: *Deleted

## 2016-08-17 ENCOUNTER — Other Ambulatory Visit: Payer: Self-pay | Admitting: Family Medicine

## 2016-08-17 ENCOUNTER — Ambulatory Visit (INDEPENDENT_AMBULATORY_CARE_PROVIDER_SITE_OTHER): Payer: Medicare Other | Admitting: General Surgery

## 2016-08-17 ENCOUNTER — Encounter: Payer: Self-pay | Admitting: General Surgery

## 2016-08-17 VITALS — BP 126/74 | HR 74 | Resp 14 | Ht 66.0 in | Wt 225.0 lb

## 2016-08-17 DIAGNOSIS — D0502 Lobular carcinoma in situ of left breast: Secondary | ICD-10-CM

## 2016-08-17 DIAGNOSIS — R601 Generalized edema: Secondary | ICD-10-CM

## 2016-08-17 DIAGNOSIS — I10 Essential (primary) hypertension: Secondary | ICD-10-CM

## 2016-08-17 NOTE — Progress Notes (Signed)
Patient ID: Lindsay Russo, female   DOB: Apr 01, 1946, 70 y.o.   MRN: [id number]  Chief Complaint  Patient presents with  . Follow-up    mammogram  . Wound Infection    HPI Lindsay Russo is a 70 y.o. female who presents for a breast evaluation. The most recent mammogram was done on 07/31/16. Patient does perform regular self breast checks and gets regular mammograms done. Patient states the hot flashes are better. She will be having left knee replacement next month.    HPI  Past Medical History:  Diagnosis Date  . Arthritis   . History of kidney stones   . Hypertension   . LCIS September 05, 2013   Left breast, ER 90%, PR 60%  . Malignant neoplasm of ascending colon [facility]) November 2007   T2, N0.5.2 cm low-grade adenocarcinoma. 0/28 nodes positive.  . Personal history of colonic polyps   . Renal failure   . Stroke [facility]) 09/17/2010   Right thalamic without residual effects    Past Surgical History:  Procedure Laterality Date  . APPENDECTOMY  1995  . BREAST BIOPSY Right August 07, 2010   Sclerotic fibroadenoma removed from the right breast at the 10:00 position  . BREAST SURGERY Left 09/05/13   LCIS, ER 90%, PR 60%, negative margins.  . COLONOSCOPY  August 03, 2012   8 mm tubulovillous adenoma removed from the transverse colon. Followup exam in 2018 plan.  . DOPPLER ECHOCARDIOGRAPHY  09/17/2010   EF 55%, Mild to moderate MR and mild to moderate TR  . HEEL SPUR EXCISION Left 2005  . LUMBAR DISC SURGERY  2009   L4-5 microdiscectomy Dr. Mauri Pole  . OVARIAN CYST REMOVAL  1980  . PARTIAL COLECTOMY Right 2007   Dr. Bary Castilla  . REPLACEMENT TOTAL KNEE Right 12/2010    Family History  Problem Relation Age of Onset  . Gastric cancer Father   . Brain cancer Father   . Hypertension Sister   . Heart disease Other   . Ulcers Other   . Epilepsy Other     Social History Social History  Substance Use Topics  . Smoking status: Never Smoker  . Smokeless tobacco: Never Used  .  Alcohol use No    Allergies  Allergen Reactions  . Tegaderm Ag Mesh [Silver] Other (See Comments)    blisters  . Amoxicillin Rash  . Sulfa Antibiotics Rash    Current Outpatient Prescriptions  Medication Sig Dispense Refill  . amLODipine (NORVASC) 10 MG tablet TAKE ONE (1) TABLET BY MOUTH EVERY DAY 90 tablet 4  . lovastatin (MEVACOR) 40 MG tablet TAKE ONE TABLET AT BEDTIME 90 tablet 4  . meclizine (ANTIVERT) 25 MG tablet Take 1 tablet (25 mg total) by mouth 3 (three) times daily as needed for dizziness. 30 tablet 0  . promethazine (PHENERGAN) 25 MG tablet Take 1 tablet (25 mg total) by mouth every 8 (eight) hours as needed for nausea or vomiting. 20 tablet 0  . tamoxifen (NOLVADEX) 20 MG tablet TAKE ONE (1) TABLET BY MOUTH EVERY DAY 90 tablet 3  . chlorthalidone (HYGROTON) 25 MG tablet TAKE ONE (1) TABLET BY MOUTH EVERY DAY 30 tablet 12   No current facility-administered medications for this visit.     Review of Systems Review of Systems  Constitutional: Negative.   Respiratory: Negative.   Cardiovascular: Negative.     Blood pressure 126/74, pulse 74, resp. rate 14, height 5\' 6"  (1.676 m), weight 225 lb (102.1 kg).  Physical  Exam Physical Exam  Constitutional: She is oriented to person, place, and time. She appears well-developed and well-nourished.  Eyes: Conjunctivae are normal. No scleral icterus.  Neck: Neck supple.  Cardiovascular: Normal rate, regular rhythm and normal heart sounds.   Pulmonary/Chest: Effort normal and breath sounds normal. Right breast exhibits no inverted nipple, no mass, no nipple discharge, no skin change and no tenderness. Left breast exhibits no inverted nipple, no mass, no nipple discharge, no skin change and no tenderness.    Abdominal: Soft. Bowel sounds are normal. There is no tenderness.  Lymphadenopathy:    She has no cervical adenopathy.    She has no axillary adenopathy.  Neurological: She is alert and oriented to person, place, and  time.  Skin: Skin is warm and dry.    Data Reviewed Bilateral diagnostic mammograms dated 07/31/2016 completed at [facility] were reviewed. BI-RADS-2.  Assessment    Doing well now 3 years status post wide excision of LCIS.  Good tolerance of tamoxifen therapy.    Plan    Due to his prothrombotic effects the patient has been asked to discontinue tamoxifen 2 weeks prior to her scheduled knee replacement and resume 2 weeks after assuming no problems.    The patient has been asked to return to the office in one year with  a bilateral diagnostic mammogram.Patient to stop her Tamoxifen for two weeks before surgery and than start back two weeks after.  This information has been scribed by Gaspar Cola CMA.   Robert Bellow 08/17/2016, 8:22 PM

## 2016-08-17 NOTE — Patient Instructions (Addendum)
  The patient has been asked to return to the office in one year with  a bilateral diagnostic mammogram.Patient to stop her Tamoxifen for two weeks before surgery and than start back two weeks after.

## 2016-08-25 DIAGNOSIS — M1712 Unilateral primary osteoarthritis, left knee: Secondary | ICD-10-CM | POA: Diagnosis not present

## 2016-10-15 ENCOUNTER — Encounter
Admission: RE | Admit: 2016-10-15 | Discharge: 2016-10-15 | Disposition: A | Discharge status: Home / Self Care | Payer: Medicare Other | Source: Ambulatory Visit | Attending: Orthopedic Surgery | Admitting: Orthopedic Surgery

## 2016-10-15 DIAGNOSIS — I10 Essential (primary) hypertension: Secondary | ICD-10-CM | POA: Diagnosis not present

## 2016-10-15 DIAGNOSIS — Z8673 Personal history of transient ischemic attack (TIA), and cerebral infarction without residual deficits: Secondary | ICD-10-CM | POA: Insufficient documentation

## 2016-10-15 DIAGNOSIS — Z01812 Encounter for preprocedural laboratory examination: Secondary | ICD-10-CM | POA: Insufficient documentation

## 2016-10-15 DIAGNOSIS — Z01810 Encounter for preprocedural cardiovascular examination: Secondary | ICD-10-CM | POA: Diagnosis not present

## 2016-10-15 HISTORY — DX: Other reaction to spinal and lumbar puncture: G97.1

## 2016-10-15 LAB — URINALYSIS COMPLETE WITH MICROSCOPIC (ARMC ONLY)
BILIRUBIN URINE: NEGATIVE
GLUCOSE, UA: NEGATIVE mg/dL
HGB URINE DIPSTICK: NEGATIVE
Ketones, ur: NEGATIVE mg/dL
NITRITE: NEGATIVE
PH: 6 (ref 5.0–8.0)
Protein, ur: 100 mg/dL — AB
SPECIFIC GRAVITY, URINE: 1.017 (ref 1.005–1.030)

## 2016-10-15 LAB — COMPREHENSIVE METABOLIC PANEL
ALK PHOS: 59 U/L (ref 38–126)
ALT: 14 U/L (ref 14–54)
AST: 24 U/L (ref 15–41)
Albumin: 4 g/dL (ref 3.5–5.0)
Anion gap: 6 (ref 5–15)
BUN: 16 mg/dL (ref 6–20)
CALCIUM: 9 mg/dL (ref 8.9–10.3)
CHLORIDE: 104 mmol/L (ref 101–111)
CO2: 29 mmol/L (ref 22–32)
CREATININE: 0.91 mg/dL (ref 0.44–1.00)
Glucose, Bld: 118 mg/dL — ABNORMAL HIGH (ref 65–99)
Potassium: 3.4 mmol/L — ABNORMAL LOW (ref 3.5–5.1)
Sodium: 139 mmol/L (ref 135–145)
Total Bilirubin: 1 mg/dL (ref 0.3–1.2)
Total Protein: 7.4 g/dL (ref 6.5–8.1)

## 2016-10-15 LAB — PROTIME-INR
INR: 0.99
PROTHROMBIN TIME: 13.1 s (ref 11.4–15.2)

## 2016-10-15 LAB — TYPE AND SCREEN
ABO/RH(D): O POS
Antibody Screen: NEGATIVE

## 2016-10-15 LAB — CBC
HEMATOCRIT: 42.3 % (ref 35.0–47.0)
HEMOGLOBIN: 14.4 g/dL (ref 12.0–16.0)
MCH: 30 pg (ref 26.0–34.0)
MCHC: 34 g/dL (ref 32.0–36.0)
MCV: 88.1 fL (ref 80.0–100.0)
PLATELETS: 185 10*3/uL (ref 150–440)
RBC: 4.8 MIL/uL (ref 3.80–5.20)
RDW: 12.8 % (ref 11.5–14.5)
WBC: 10.3 10*3/uL (ref 3.6–11.0)

## 2016-10-15 LAB — APTT: aPTT: 32 seconds (ref 24–36)

## 2016-10-15 LAB — SEDIMENTATION RATE: Sed Rate: 9 mm/hr (ref 0–30)

## 2016-10-15 LAB — C-REACTIVE PROTEIN

## 2016-10-15 LAB — SURGICAL PCR SCREEN
MRSA, PCR: NEGATIVE
Staphylococcus aureus: NEGATIVE

## 2016-10-15 NOTE — Pre-Procedure Instructions (Signed)
Spoke with Lindsay Russo Dr. Clydell Hakim nurse regarding pre-op order of Tranexamic Acid and pt's history of CVA being a possible contra indication for giving this med.  Lindsay Russo reported she will review this will Dr. Marry Guan to see if want to proceed with this med.

## 2016-10-15 NOTE — Pre-Procedure Instructions (Signed)
Dr. Marry Guan called this nurse to discuss the Tranexamic Acid pre-op order.  New guidelines contra indicate giving this med within 3 months of a CVS or MI.  Pt's CVA was longer than 3 months ago.  Dr. Marry Guan is going to proceed with the Tranexamic Acid prior to upcoming left total knee replacement.

## 2016-10-15 NOTE — Patient Instructions (Signed)
  Your procedure is scheduled [id number] Nov. 27 , 2017. Report to Same Day Surgery. To find out your arrival time please call [phone] between 1PM - 3PM on Friday Nov. 24, 2017 .  Remember: Instructions that are not followed completely may result in serious medical risk, up to and including death, or upon the discretion of your surgeon and anesthesiologist your surgery may need to be rescheduled.    _x___ 1. Do not eat food or drink liquids after midnight. No gum chewing or  hard candies.     ____ 2. No Alcohol for 24 hours before or after surgery.   ____ 3. Bring all medications with you on the day of surgery if instructed.    __x__ 4. Notify your doctor if there is any change in your medical condition     (cold, fever, infections).    _____ 5. No smoking 24 hours prior to surgery.     Do not wear jewelry, make-up, hairpins, clips or nail polish.  Do not wear lotions, powders, or perfumes.   Do not shave 48 hours prior to surgery. Men may shave face and neck.  Do not bring valuables to the hospital.    [facility] is not responsible for any belongings or valuables.               Contacts, dentures or bridgework may not be worn into surgery.  Leave your suitcase in the car. After surgery it may be brought to your room.  For patients admitted to the hospital, discharge time is determined by your treatment team.   Patients discharged the day of surgery will not be allowed to drive home.    Please read over the following fact sheets that you were given:   [facility] Preparing for Surgery  _x_ Take these medicines the morning of surgery with A SIP OF WATER:    1. tamoxifen (NOLVADEX)   ____ Fleet Enema (as directed)   _x___ Use CHG Soap as directed on instruction sheet  ____ Use inhalers on the day of surgery and bring to hospital day of surgery  ____ Stop metformin 2 days prior to surgery    ____ Take 1/2 of usual insulin dose the night before surgery and none on the  morning of  surgery.   ____ Stop Coumadin/Plavix/aspirin on does not apply  _x___ Stop Anti-inflammatories such as Advil, Aleve, Ibuprofen, Motrin, Naproxen, Naprosyn, Goodies powders or aspirin products. OK to take Tylenol.   _x__ Stop  bismuth subsalicylate (PEPTO BISMOL) until after surgery.    ____ Bring C-Pap to the hospital.

## 2016-10-16 LAB — URINE CULTURE: Special Requests: NORMAL

## 2016-10-16 LAB — HEMOGLOBIN A1C
HEMOGLOBIN A1C: 6.1 % — AB (ref 4.8–5.6)
Mean Plasma Glucose: 128 mg/dL

## 2016-10-16 NOTE — Pre-Procedure Instructions (Signed)
UA and HgbA1C results sent to Dr. Marry Guan for review.

## 2016-10-17 NOTE — Pre-Procedure Instructions (Signed)
Please review urine culture results.  Do you want specimen recollected as suggested?

## 2016-10-26 ENCOUNTER — Inpatient Hospital Stay: Payer: Medicare Other | Admitting: Anesthesiology

## 2016-10-26 ENCOUNTER — Inpatient Hospital Stay
Admission: RE | Admit: 2016-10-26 | Discharge: 2016-10-28 | DRG: 470 | Disposition: A | Discharge status: Home Health | Payer: Medicare Other | Source: Ambulatory Visit | Attending: Orthopedic Surgery | Admitting: Orthopedic Surgery

## 2016-10-26 ENCOUNTER — Encounter: Payer: Self-pay | Admitting: Orthopedic Surgery

## 2016-10-26 ENCOUNTER — Encounter
Admission: RE | Disposition: A | Discharge status: Home Health | Payer: Self-pay | Source: Ambulatory Visit | Attending: Orthopedic Surgery

## 2016-10-26 ENCOUNTER — Inpatient Hospital Stay: Payer: Medicare Other

## 2016-10-26 DIAGNOSIS — Z880 Allergy status to penicillin: Secondary | ICD-10-CM | POA: Diagnosis not present

## 2016-10-26 DIAGNOSIS — R262 Difficulty in walking, not elsewhere classified: Secondary | ICD-10-CM

## 2016-10-26 DIAGNOSIS — Z888 Allergy status to other drugs, medicaments and biological substances status: Secondary | ICD-10-CM | POA: Diagnosis not present

## 2016-10-26 DIAGNOSIS — Z85038 Personal history of other malignant neoplasm of large intestine: Secondary | ICD-10-CM | POA: Diagnosis not present

## 2016-10-26 DIAGNOSIS — Z79899 Other long term (current) drug therapy: Secondary | ICD-10-CM

## 2016-10-26 DIAGNOSIS — I10 Essential (primary) hypertension: Secondary | ICD-10-CM | POA: Diagnosis not present

## 2016-10-26 DIAGNOSIS — Z79810 Long term (current) use of selective estrogen receptor modulators (SERMs): Secondary | ICD-10-CM

## 2016-10-26 DIAGNOSIS — Z8673 Personal history of transient ischemic attack (TIA), and cerebral infarction without residual deficits: Secondary | ICD-10-CM | POA: Diagnosis not present

## 2016-10-26 DIAGNOSIS — M6281 Muscle weakness (generalized): Secondary | ICD-10-CM

## 2016-10-26 DIAGNOSIS — Z96651 Presence of right artificial knee joint: Secondary | ICD-10-CM | POA: Diagnosis present

## 2016-10-26 DIAGNOSIS — Z6837 Body mass index (BMI) 37.0-37.9, adult: Secondary | ICD-10-CM

## 2016-10-26 DIAGNOSIS — M1712 Unilateral primary osteoarthritis, left knee: Principal | ICD-10-CM | POA: Diagnosis present

## 2016-10-26 DIAGNOSIS — C50919 Malignant neoplasm of unspecified site of unspecified female breast: Secondary | ICD-10-CM | POA: Diagnosis present

## 2016-10-26 DIAGNOSIS — Z96652 Presence of left artificial knee joint: Secondary | ICD-10-CM | POA: Diagnosis not present

## 2016-10-26 DIAGNOSIS — Z882 Allergy status to sulfonamides status: Secondary | ICD-10-CM | POA: Diagnosis not present

## 2016-10-26 DIAGNOSIS — Z96659 Presence of unspecified artificial knee joint: Secondary | ICD-10-CM

## 2016-10-26 DIAGNOSIS — N200 Calculus of kidney: Secondary | ICD-10-CM | POA: Diagnosis present

## 2016-10-26 DIAGNOSIS — Z471 Aftercare following joint replacement surgery: Secondary | ICD-10-CM | POA: Diagnosis not present

## 2016-10-26 HISTORY — PX: KNEE ARTHROPLASTY: SHX992

## 2016-10-26 LAB — ABO/RH: ABO/RH(D): O POS

## 2016-10-26 SURGERY — ARTHROPLASTY, KNEE, TOTAL, USING IMAGELESS COMPUTER-ASSISTED NAVIGATION
Anesthesia: General | Laterality: Left | Wound class: Clean

## 2016-10-26 MED ORDER — TRANEXAMIC ACID 1000 MG/10ML IV SOLN
1000.0000 mg | INTRAVENOUS | Status: DC
  Filled 2016-10-26: qty 10

## 2016-10-26 MED ORDER — FLEET ENEMA 7-19 GM/118ML RE ENEM: 1.0000 | ENEMA | Freq: Once | RECTAL | Status: DC | PRN

## 2016-10-26 MED ORDER — CHLORHEXIDINE GLUCONATE 4 % EX LIQD: 60.0000 mL | Freq: Once | CUTANEOUS | Status: DC

## 2016-10-26 MED ORDER — ACETAMINOPHEN 10 MG/ML IV SOLN
1000.0000 mg | Freq: Four times a day (QID) | INTRAVENOUS | Status: AC
  Administered 2016-10-26 – 2016-10-27 (×4): 1000 mg via INTRAVENOUS
  Filled 2016-10-26 (×4): qty 100

## 2016-10-26 MED ORDER — ROCURONIUM BROMIDE 100 MG/10ML IV SOLN
INTRAVENOUS | Status: DC | PRN
  Administered 2016-10-26: 20 mg via INTRAVENOUS
  Administered 2016-10-26: 50 mg via INTRAVENOUS

## 2016-10-26 MED ORDER — SODIUM CHLORIDE 0.9 % IV SOLN
INTRAVENOUS | Status: DC | PRN
  Administered 2016-10-26: 60 mL

## 2016-10-26 MED ORDER — METOCLOPRAMIDE HCL 10 MG PO TABS
10.0000 mg | ORAL_TABLET | Freq: Three times a day (TID) | ORAL | Status: DC
  Administered 2016-10-26 – 2016-10-27 (×6): 10 mg via ORAL
  Filled 2016-10-26 (×7): qty 1

## 2016-10-26 MED ORDER — ENOXAPARIN SODIUM 30 MG/0.3ML ~~LOC~~ SOLN
30.0000 mg | Freq: Two times a day (BID) | SUBCUTANEOUS | Status: DC
  Administered 2016-10-27 – 2016-10-28 (×3): 30 mg via SUBCUTANEOUS
  Filled 2016-10-26 (×3): qty 0.3

## 2016-10-26 MED ORDER — SODIUM CHLORIDE 0.9 % IJ SOLN
INTRAMUSCULAR | Status: AC
  Filled 2016-10-26: qty 50

## 2016-10-26 MED ORDER — KETAMINE HCL 50 MG/ML IJ SOLN
INTRAMUSCULAR | Status: DC | PRN
  Administered 2016-10-26: 50 mg via INTRAVENOUS
  Administered 2016-10-26: 5 mg via INTRAVENOUS

## 2016-10-26 MED ORDER — CLINDAMYCIN PHOSPHATE 900 MG/50ML IV SOLN
INTRAVENOUS | Status: DC | PRN
  Administered 2016-10-26: 900 mg via INTRAVENOUS

## 2016-10-26 MED ORDER — BUPIVACAINE HCL (PF) 0.25 % IJ SOLN
INTRAMUSCULAR | Status: DC | PRN
  Administered 2016-10-26: 60 mL

## 2016-10-26 MED ORDER — FENTANYL CITRATE (PF) 100 MCG/2ML IJ SOLN
25.0000 ug | INTRAMUSCULAR | Status: AC | PRN
  Administered 2016-10-26 (×6): 25 ug via INTRAVENOUS

## 2016-10-26 MED ORDER — DEXAMETHASONE SODIUM PHOSPHATE 4 MG/ML IJ SOLN
INTRAMUSCULAR | Status: DC | PRN
  Administered 2016-10-26: 8 mg via INTRAVENOUS

## 2016-10-26 MED ORDER — SODIUM CHLORIDE FLUSH 0.9 % IV SOLN
INTRAVENOUS | Status: AC
  Filled 2016-10-26: qty 3

## 2016-10-26 MED ORDER — ALUM & MAG HYDROXIDE-SIMETH 200-200-20 MG/5ML PO SUSP: 30.0000 mL | ORAL | Status: DC | PRN

## 2016-10-26 MED ORDER — FAMOTIDINE 20 MG PO TABS
ORAL_TABLET | ORAL | Status: AC
  Filled 2016-10-26: qty 1

## 2016-10-26 MED ORDER — BUPIVACAINE HCL (PF) 0.25 % IJ SOLN
INTRAMUSCULAR | Status: AC
Start: 2016-10-26 — End: 2016-10-26
  Filled 2016-10-26: qty 30

## 2016-10-26 MED ORDER — MORPHINE SULFATE (PF) 2 MG/ML IV SOLN
2.0000 mg | INTRAVENOUS | Status: DC | PRN
  Administered 2016-10-26 (×2): 2 mg via INTRAVENOUS
  Filled 2016-10-26 (×2): qty 1

## 2016-10-26 MED ORDER — SODIUM CHLORIDE 0.9 % IV SOLN
INTRAVENOUS | Status: DC
  Administered 2016-10-26 – 2016-10-27 (×2): via INTRAVENOUS

## 2016-10-26 MED ORDER — ACETAMINOPHEN 10 MG/ML IV SOLN
INTRAVENOUS | Status: DC | PRN
  Administered 2016-10-26: 1000 mg via INTRAVENOUS

## 2016-10-26 MED ORDER — ONDANSETRON HCL 4 MG/2ML IJ SOLN
INTRAMUSCULAR | Status: AC
  Filled 2016-10-26: qty 2

## 2016-10-26 MED ORDER — TRANEXAMIC ACID 1000 MG/10ML IV SOLN
INTRAVENOUS | Status: DC | PRN
  Administered 2016-10-26: 1000 mg via INTRAVENOUS

## 2016-10-26 MED ORDER — FENTANYL CITRATE (PF) 100 MCG/2ML IJ SOLN
INTRAMUSCULAR | Status: AC
  Filled 2016-10-26: qty 2

## 2016-10-26 MED ORDER — HYDRALAZINE HCL 20 MG/ML IJ SOLN
10.0000 mg | INTRAMUSCULAR | Status: DC | PRN
  Administered 2016-10-26: 10 mg via INTRAVENOUS
  Filled 2016-10-26: qty 1

## 2016-10-26 MED ORDER — INFLUENZA VAC SPLIT QUAD 0.5 ML IM SUSY: 0.5000 mL | PREFILLED_SYRINGE | INTRAMUSCULAR | Status: DC | PRN

## 2016-10-26 MED ORDER — PRAVASTATIN SODIUM 40 MG PO TABS
40.0000 mg | ORAL_TABLET | Freq: Every day | ORAL | Status: DC
  Administered 2016-10-26 – 2016-10-27 (×2): 40 mg via ORAL
  Filled 2016-10-26 (×2): qty 1

## 2016-10-26 MED ORDER — NEOMYCIN-POLYMYXIN B GU 40-200000 IR SOLN
Status: DC | PRN
  Administered 2016-10-26: 2 mL
  Administered 2016-10-26: 12 mL

## 2016-10-26 MED ORDER — NEOMYCIN-POLYMYXIN B GU 40-200000 IR SOLN
Status: AC
  Filled 2016-10-26: qty 1

## 2016-10-26 MED ORDER — ONDANSETRON HCL 4 MG/2ML IJ SOLN
4.0000 mg | Freq: Once | INTRAMUSCULAR | Status: AC | PRN
  Administered 2016-10-26: 4 mg via INTRAVENOUS

## 2016-10-26 MED ORDER — SUGAMMADEX SODIUM 200 MG/2ML IV SOLN
INTRAVENOUS | Status: DC | PRN
Start: 2016-10-26 — End: 2016-10-26
  Administered 2016-10-26: 200 mg via INTRAVENOUS

## 2016-10-26 MED ORDER — OXYCODONE HCL 5 MG PO TABS
5.0000 mg | ORAL_TABLET | ORAL | Status: DC | PRN
  Administered 2016-10-26 – 2016-10-27 (×7): 10 mg via ORAL
  Administered 2016-10-28: 5 mg via ORAL
  Administered 2016-10-28: 10 mg via ORAL
  Filled 2016-10-26 (×9): qty 2

## 2016-10-26 MED ORDER — AMLODIPINE BESYLATE 10 MG PO TABS
10.0000 mg | ORAL_TABLET | Freq: Every day | ORAL | Status: DC
  Administered 2016-10-26 – 2016-10-28 (×3): 10 mg via ORAL
  Filled 2016-10-26 (×3): qty 1

## 2016-10-26 MED ORDER — DIPHENHYDRAMINE HCL 12.5 MG/5ML PO ELIX: 12.5000 mg | ORAL_SOLUTION | ORAL | Status: DC | PRN

## 2016-10-26 MED ORDER — CHLORTHALIDONE 25 MG PO TABS
25.0000 mg | ORAL_TABLET | Freq: Every day | ORAL | Status: DC
  Administered 2016-10-26 – 2016-10-28 (×2): 25 mg via ORAL
  Filled 2016-10-26 (×3): qty 1

## 2016-10-26 MED ORDER — SODIUM CHLORIDE FLUSH 0.9 % IV SOLN
INTRAVENOUS | Status: AC
  Administered 2016-10-26: 10 mL
  Filled 2016-10-26: qty 10

## 2016-10-26 MED ORDER — PROPOFOL 10 MG/ML IV BOLUS
INTRAVENOUS | Status: DC | PRN
  Administered 2016-10-26: 50 mg via INTRAVENOUS
  Administered 2016-10-26: 150 mg via INTRAVENOUS

## 2016-10-26 MED ORDER — FAMOTIDINE 20 MG PO TABS
20.0000 mg | ORAL_TABLET | Freq: Once | ORAL | Status: AC
  Administered 2016-10-26: 20 mg via ORAL

## 2016-10-26 MED ORDER — MENTHOL 3 MG MT LOZG
1.0000 | LOZENGE | OROMUCOSAL | Status: DC | PRN
  Filled 2016-10-26: qty 9

## 2016-10-26 MED ORDER — ONDANSETRON HCL 4 MG/2ML IJ SOLN
INTRAMUSCULAR | Status: DC | PRN
  Administered 2016-10-26: 4 mg via INTRAVENOUS

## 2016-10-26 MED ORDER — ACETAMINOPHEN 10 MG/ML IV SOLN
INTRAVENOUS | Status: AC
  Filled 2016-10-26: qty 100

## 2016-10-26 MED ORDER — TRAMADOL HCL 50 MG PO TABS
50.0000 mg | ORAL_TABLET | ORAL | Status: DC | PRN
  Administered 2016-10-26 – 2016-10-27 (×3): 50 mg via ORAL
  Administered 2016-10-27 – 2016-10-28 (×4): 100 mg via ORAL
  Filled 2016-10-26 (×3): qty 2
  Filled 2016-10-26 (×3): qty 1
  Filled 2016-10-26: qty 2

## 2016-10-26 MED ORDER — HYDRALAZINE HCL 20 MG/ML IJ SOLN
INTRAMUSCULAR | Status: DC | PRN
  Administered 2016-10-26 (×2): 6 mg via INTRAVENOUS

## 2016-10-26 MED ORDER — BISACODYL 10 MG RE SUPP: 10.0000 mg | Freq: Every day | RECTAL | Status: DC | PRN

## 2016-10-26 MED ORDER — PHENYLEPHRINE HCL 10 MG/ML IJ SOLN
INTRAMUSCULAR | Status: DC | PRN
  Administered 2016-10-26: 100 ug via INTRAVENOUS

## 2016-10-26 MED ORDER — TRANEXAMIC ACID 1000 MG/10ML IV SOLN
1000.0000 mg | Freq: Once | INTRAVENOUS | Status: AC
  Administered 2016-10-26: 1000 mg via INTRAVENOUS
  Filled 2016-10-26: qty 10

## 2016-10-26 MED ORDER — MECLIZINE HCL 25 MG PO TABS: 25.0000 mg | ORAL_TABLET | Freq: Three times a day (TID) | ORAL | Status: DC | PRN

## 2016-10-26 MED ORDER — LIDOCAINE HCL (PF) 4 % IJ SOLN
INTRAMUSCULAR | Status: DC | PRN
  Administered 2016-10-26: 4 mL via RESPIRATORY_TRACT

## 2016-10-26 MED ORDER — CLINDAMYCIN PHOSPHATE 600 MG/50ML IV SOLN
INTRAVENOUS | Status: AC
Start: 2016-10-26 — End: 2016-10-26
  Filled 2016-10-26: qty 50

## 2016-10-26 MED ORDER — ADULT MULTIVITAMIN W/MINERALS CH
1.0000 | ORAL_TABLET | Freq: Every day | ORAL | Status: DC
  Administered 2016-10-26 – 2016-10-28 (×3): 1 via ORAL
  Filled 2016-10-26 (×3): qty 1

## 2016-10-26 MED ORDER — FENTANYL CITRATE (PF) 100 MCG/2ML IJ SOLN
INTRAMUSCULAR | Status: AC
Start: 2016-10-26 — End: 2016-10-27
  Filled 2016-10-26: qty 2

## 2016-10-26 MED ORDER — ACETAMINOPHEN 650 MG RE SUPP: 650.0000 mg | Freq: Four times a day (QID) | RECTAL | Status: DC | PRN

## 2016-10-26 MED ORDER — BUPIVACAINE HCL (PF) 0.25 % IJ SOLN
INTRAMUSCULAR | Status: AC
  Filled 2016-10-26: qty 30

## 2016-10-26 MED ORDER — LIDOCAINE HCL (CARDIAC) 20 MG/ML IV SOLN
INTRAVENOUS | Status: DC | PRN
  Administered 2016-10-26: 60 mg via INTRAVENOUS

## 2016-10-26 MED ORDER — LABETALOL HCL 5 MG/ML IV SOLN
INTRAVENOUS | Status: DC | PRN
  Administered 2016-10-26: 15 mg via INTRAVENOUS
  Administered 2016-10-26: 10 mg via INTRAVENOUS

## 2016-10-26 MED ORDER — TAMOXIFEN CITRATE 10 MG PO TABS
20.0000 mg | ORAL_TABLET | Freq: Every day | ORAL | Status: DC
  Administered 2016-10-26 – 2016-10-28 (×3): 20 mg via ORAL
  Filled 2016-10-26 (×3): qty 2

## 2016-10-26 MED ORDER — LACTATED RINGERS IV SOLN
INTRAVENOUS | Status: DC
  Administered 2016-10-26 (×2): via INTRAVENOUS

## 2016-10-26 MED ORDER — SODIUM CHLORIDE 0.9 % IV SOLN
INTRAVENOUS | Status: DC | PRN
  Administered 2016-10-26: 4 ug/kg/min via INTRAVENOUS

## 2016-10-26 MED ORDER — PANTOPRAZOLE SODIUM 40 MG PO TBEC
40.0000 mg | DELAYED_RELEASE_TABLET | Freq: Two times a day (BID) | ORAL | Status: DC
  Administered 2016-10-26 – 2016-10-28 (×4): 40 mg via ORAL
  Filled 2016-10-26 (×4): qty 1

## 2016-10-26 MED ORDER — CLINDAMYCIN PHOSPHATE 600 MG/50ML IV SOLN
600.0000 mg | Freq: Four times a day (QID) | INTRAVENOUS | Status: AC
  Administered 2016-10-26 – 2016-10-27 (×4): 600 mg via INTRAVENOUS
  Filled 2016-10-26 (×4): qty 50

## 2016-10-26 MED ORDER — MAGNESIUM HYDROXIDE 400 MG/5ML PO SUSP
30.0000 mL | Freq: Every day | ORAL | Status: DC | PRN
  Administered 2016-10-28: 30 mL via ORAL
  Filled 2016-10-26: qty 30

## 2016-10-26 MED ORDER — SENNOSIDES-DOCUSATE SODIUM 8.6-50 MG PO TABS
1.0000 | ORAL_TABLET | Freq: Two times a day (BID) | ORAL | Status: DC
  Administered 2016-10-26 – 2016-10-28 (×4): 1 via ORAL
  Filled 2016-10-26 (×4): qty 1

## 2016-10-26 MED ORDER — CLINDAMYCIN PHOSPHATE 900 MG/50ML IV SOLN: 900.0000 mg | INTRAVENOUS | Status: DC

## 2016-10-26 MED ORDER — ONDANSETRON HCL 4 MG PO TABS: 4.0000 mg | ORAL_TABLET | Freq: Four times a day (QID) | ORAL | Status: DC | PRN

## 2016-10-26 MED ORDER — ACETAMINOPHEN 325 MG PO TABS: 650.0000 mg | ORAL_TABLET | Freq: Four times a day (QID) | ORAL | Status: DC | PRN

## 2016-10-26 MED ORDER — PHENOL 1.4 % MT LIQD
1.0000 | OROMUCOSAL | Status: DC | PRN
  Filled 2016-10-26: qty 177

## 2016-10-26 MED ORDER — ONDANSETRON HCL 4 MG/2ML IJ SOLN
4.0000 mg | Freq: Four times a day (QID) | INTRAMUSCULAR | Status: DC | PRN
  Administered 2016-10-26: 4 mg via INTRAVENOUS
  Filled 2016-10-26: qty 2

## 2016-10-26 MED ORDER — REMIFENTANIL HCL 1 MG IV SOLR
INTRAVENOUS | Status: DC | PRN
  Administered 2016-10-26: .1 ug/kg/min via INTRAVENOUS

## 2016-10-26 MED ORDER — CLINDAMYCIN PHOSPHATE 900 MG/50ML IV SOLN
INTRAVENOUS | Status: AC
  Filled 2016-10-26: qty 50

## 2016-10-26 MED ORDER — FENTANYL CITRATE (PF) 100 MCG/2ML IJ SOLN
INTRAMUSCULAR | Status: DC | PRN
  Administered 2016-10-26: 250 ug via INTRAVENOUS
  Administered 2016-10-26: 125 ug via INTRAVENOUS
  Administered 2016-10-26: 250 ug via INTRAVENOUS

## 2016-10-26 MED ORDER — BUPIVACAINE LIPOSOME 1.3 % IJ SUSP
INTRAMUSCULAR | Status: AC
  Filled 2016-10-26: qty 20

## 2016-10-26 MED ORDER — FERROUS SULFATE 325 (65 FE) MG PO TABS
325.0000 mg | ORAL_TABLET | Freq: Two times a day (BID) | ORAL | Status: DC
  Administered 2016-10-26 – 2016-10-28 (×4): 325 mg via ORAL
  Filled 2016-10-26 (×4): qty 1

## 2016-10-26 MED ORDER — PROPOFOL 500 MG/50ML IV EMUL
INTRAVENOUS | Status: DC | PRN
  Administered 2016-10-26: 40 ug/kg/min via INTRAVENOUS

## 2016-10-26 SURGICAL SUPPLY — 62 items
AUTOTRANSFUS HAS 1/8 (MISCELLANEOUS) ×3
BATTERY INSTRU NAVIGATION (MISCELLANEOUS) ×12 IMPLANT
BLADE SAW 1 (BLADE) IMPLANT
BLADE SAW 1/2 (BLADE) ×3 IMPLANT
BLADE SAW SGTL 13X75X1.27 (BLADE) ×3 IMPLANT
BONE CEMENT GENTAMICIN (Cement) ×6 IMPLANT
CANISTER SUCT 1200ML W/VALVE (MISCELLANEOUS) ×3 IMPLANT
CANISTER SUCT 3000ML (MISCELLANEOUS) ×6 IMPLANT
CAPT KNEE TOTAL 3 ATTUNE ×3 IMPLANT
CATH TRAY METER 16FR LF (MISCELLANEOUS) ×3 IMPLANT
CEMENT BONE GENTAMICIN 40 (Cement) ×2 IMPLANT
COOLER POLAR GLACIER W/PUMP (MISCELLANEOUS) ×3 IMPLANT
CUFF TOURN 24 STER (MISCELLANEOUS) IMPLANT
CUFF TOURN 30 STER DUAL PORT (MISCELLANEOUS) ×3 IMPLANT
DRAPE SHEET LG 3/4 BI-LAMINATE (DRAPES) ×3 IMPLANT
DRSG DERMACEA 8X12 NADH (GAUZE/BANDAGES/DRESSINGS) ×3 IMPLANT
DRSG OPSITE POSTOP 4X14 (GAUZE/BANDAGES/DRESSINGS) ×3 IMPLANT
DRSG TEGADERM 4X4.75 (GAUZE/BANDAGES/DRESSINGS) ×3 IMPLANT
DURAPREP 26ML APPLICATOR (WOUND CARE) ×6 IMPLANT
ELECT CAUTERY BLADE 6.4 (BLADE) ×3 IMPLANT
ELECT REM PT RETURN 9FT ADLT (ELECTROSURGICAL) ×3
ELECTRODE REM PT RTRN 9FT ADLT (ELECTROSURGICAL) ×1 IMPLANT
EX-PIN ORTHOLOCK NAV 4X150 (PIN) ×6 IMPLANT
GLOVE BIOGEL M STRL SZ7.5 (GLOVE) ×15 IMPLANT
GLOVE INDICATOR 8.0 STRL GRN (GLOVE) ×15 IMPLANT
GLOVE SURG 9.0 ORTHO LTXF (GLOVE) ×15 IMPLANT
GLOVE SURG ORTHO 9.0 STRL STRW (GLOVE) ×3 IMPLANT
GOWN STRL REUS W/ TWL LRG LVL3 (GOWN DISPOSABLE) ×3 IMPLANT
GOWN STRL REUS W/TWL 2XL LVL3 (GOWN DISPOSABLE) ×3 IMPLANT
GOWN STRL REUS W/TWL LRG LVL3 (GOWN DISPOSABLE) ×6
HANDPIECE INTERPULSE COAX TIP (DISPOSABLE) ×2
HOLDER FOLEY CATH W/STRAP (MISCELLANEOUS) ×3 IMPLANT
HOOD PEEL AWAY FLYTE STAYCOOL (MISCELLANEOUS) ×6 IMPLANT
KIT RM TURNOVER STRD PROC AR (KITS) ×3 IMPLANT
KNIFE SCULPS 14X20 (INSTRUMENTS) ×3 IMPLANT
LABEL OR SOLS (LABEL) ×3 IMPLANT
NDL SAFETY 18GX1.5 (NEEDLE) ×3 IMPLANT
NEEDLE SPNL 20GX3.5 QUINCKE YW (NEEDLE) ×3 IMPLANT
NS IRRIG 500ML POUR BTL (IV SOLUTION) ×3 IMPLANT
PACK TOTAL KNEE (MISCELLANEOUS) ×3 IMPLANT
PAD WRAPON POLAR KNEE (MISCELLANEOUS) ×1 IMPLANT
PIN DRILL QUICK PACK ×3 IMPLANT
PIN FIXATION 1/8DIA X 3INL (PIN) ×3 IMPLANT
SET HNDPC FAN SPRY TIP SCT (DISPOSABLE) ×1 IMPLANT
SOL .9 NS 3000ML IRR  AL (IV SOLUTION) ×2
SOL .9 NS 3000ML IRR UROMATIC (IV SOLUTION) ×1 IMPLANT
SOL PREP PVP 2OZ (MISCELLANEOUS) ×3
SOLUTION PREP PVP 2OZ (MISCELLANEOUS) ×1 IMPLANT
SPONGE DRAIN TRACH 4X4 STRL 2S (GAUZE/BANDAGES/DRESSINGS) ×3 IMPLANT
STAPLER SKIN PROX 35W (STAPLE) ×3 IMPLANT
STOCKINETTE IMPERV 14X48 (MISCELLANEOUS) ×3 IMPLANT
SUCTION FRAZIER HANDLE 10FR (MISCELLANEOUS) ×2
SUCTION TUBE FRAZIER 10FR DISP (MISCELLANEOUS) ×1 IMPLANT
SUT VIC AB 0 CT1 36 (SUTURE) ×3 IMPLANT
SUT VIC AB 1 CT1 36 (SUTURE) ×6 IMPLANT
SUT VIC AB 2-0 CT2 27 (SUTURE) ×3 IMPLANT
SYR 20CC LL (SYRINGE) ×3 IMPLANT
SYR 30ML LL (SYRINGE) ×6 IMPLANT
SYSTEM AUTOTRANSFUS DUAL TROCR (MISCELLANEOUS) ×1 IMPLANT
TOWEL OR 17X26 4PK STRL BLUE (TOWEL DISPOSABLE) ×3 IMPLANT
TOWER CARTRIDGE SMART MIX (DISPOSABLE) ×3 IMPLANT
WRAPON POLAR PAD KNEE (MISCELLANEOUS) ×3

## 2016-10-26 NOTE — Op Note (Signed)
OPERATIVE NOTE  DATE OF SURGERY:  10/26/2016  PATIENT NAME:  Lindsay Russo   DOB: May 02, 1946  MRN: [id number]  PRE-OPERATIVE DIAGNOSIS: Degenerative arthrosis of the left knee, primary  POST-OPERATIVE DIAGNOSIS:  Same  PROCEDURE:  Left total knee arthroplasty using computer-assisted navigation  SURGEON:  Marciano Sequin. M.D.  ASSISTANT:  Vance Peper, PA (present and scrubbed throughout the case, critical for assistance with exposure, retraction, instrumentation, and closure)  ANESTHESIA: general  ESTIMATED BLOOD LOSS: 50 mL  FLUIDS REPLACED: 1000 mL of crystalloid  TOURNIQUET TIME: 87 minutes  DRAINS: 2 medium drains to a reinfusion system  SOFT TISSUE RELEASES: Anterior cruciate ligament, posterior cruciate ligament, deep medial collateral ligament, patellofemoral ligament  IMPLANTS UTILIZED: DePuy Attune size 5N posterior stabilized femoral component (cemented), size 5 rotating platform tibial component (cemented), 38 mm medialized dome patella (cemented), and a 5 mm stabilized rotating platform polyethylene insert.  INDICATIONS FOR SURGERY: Lindsay Russo is a 70 y.o. year old female with a long history of progressive knee pain. X-rays demonstrated severe degenerative changes in tricompartmental fashion. The patient had not seen any significant improvement despite conservative nonsurgical intervention. After discussion of the risks and benefits of surgical intervention, the patient expressed understanding of the risks benefits and agree with plans for total knee arthroplasty.   The risks, benefits, and alternatives were discussed at length including but not limited to the risks of infection, bleeding, nerve injury, stiffness, blood clots, the need for revision surgery, cardiopulmonary complications, among others, and they were willing to proceed.  PROCEDURE IN DETAIL: The patient was brought into the operating room and, after adequate general anesthesia was achieved, a  tourniquet was placed on the patient's upper thigh. The patient's knee and leg were cleaned and prepped with alcohol and DuraPrep and draped in the usual sterile fashion. A "timeout" was performed as per usual protocol. The lower extremity was exsanguinated using an Esmarch, and the tourniquet was inflated to 300 mmHg. An anterior longitudinal incision was made followed by a standard mid vastus approach. The deep fibers of the medial collateral ligament were elevated in a subperiosteal fashion off of the medial flare of the tibia so as to maintain a continuous soft tissue sleeve. The patella was subluxed laterally and the patellofemoral ligament was incised. Inspection of the knee demonstrated severe degenerative changes with full-thickness loss of articular cartilage. Osteophytes were debrided using a rongeur. Anterior and posterior cruciate ligaments were excised. Two 4.0 mm Schanz pins were inserted in the femur and into the tibia for attachment of the array of trackers used for computer-assisted navigation. Hip center was identified using a circumduction technique. Distal landmarks were mapped using the computer. The distal femur and proximal tibia were mapped using the computer. The distal femoral cutting guide was positioned using computer-assisted navigation so as to achieve a 5 distal valgus cut. The femur was sized and it was felt that a size 5N femoral component was appropriate. A size 5 femoral cutting guide was positioned and the anterior cut was performed and verified using the computer. This was followed by completion of the posterior and chamfer cuts. Femoral cutting guide for the central box was then positioned in the center box cut was performed.  Attention was then directed to the proximal tibia. Medial and lateral menisci were excised. The extramedullary tibial cutting guide was positioned using computer-assisted navigation so as to achieve a 0 varus-valgus alignment and 3 posterior slope.  The cut was performed and verified using the computer.  The proximal tibia was sized and it was felt that a size 5 tibial tray was appropriate. Tibial and femoral trials were inserted followed by insertion of a 5 mm polyethylene insert. The knee was felt to be tight both in flexion and extension. The trial components were removed and the extramedullary tibial cutting guide was positioned so as to resect an additional 2 mm of bone. This was performed and verified using the computer. Trial components were reinserted followed by insertion of a 5 mm polyethylene trial. This allowed for excellent mediolateral soft tissue balancing both in flexion and in full extension. Finally, the patella was cut and prepared so as to accommodate a 38 mm medialized dome patella. A patella trial was placed and the knee was placed through a range of motion with excellent patellar tracking appreciated. The femoral trial was removed after debridement of posterior osteophytes. The central post-hole for the tibial component was reamed followed by insertion of a keel punch. Tibial trials were then removed. Cut surfaces of bone were irrigated with copious amounts of normal saline with antibiotic solution using pulsatile lavage and then suctioned dry. Polymethylmethacrylate cement with gentamicin was prepared in the usual fashion using a vacuum mixer. Cement was applied to the cut surface of the proximal tibia as well as along the undersurface of a size 5 rotating platform tibial component. Tibial component was positioned and impacted into place. Excess cement was removed using [employer] fast streamer. Cement was then applied to the cut surfaces of the femur as well as along the posterior flanges of the size 5N femoral component. The femoral component was positioned and impacted into place. Excess cement was removed using [employer] fast streamer. A 5 mm polyethylene trial was inserted and the knee was brought into full extension with steady axial compression  applied. Finally, cement was applied to the backside of a 38 mm medialized dome patella and the patellar component was positioned and patellar clamp applied. Excess cement was removed using [employer] fast streamer. After adequate curing of the cement, the tourniquet was deflated after a total tourniquet time of 87 minutes. Hemostasis was achieved using electrocautery. The knee was irrigated with copious amounts of normal saline with antibiotic solution using pulsatile lavage and then suctioned dry. 20 mL of 1.3% Exparel and 60 mL of 0.25% Marcaine in 40 mL of normal saline was injected along the posterior capsule, medial and lateral gutters, and along the arthrotomy site. A 5 mm stabilized rotating platform polyethylene insert was inserted and the knee was placed through a range of motion with excellent mediolateral soft tissue balancing appreciated and excellent patellar tracking noted. 2 medium drains were placed in the wound bed and brought out through separate stab incisions to be attached to a reinfusion system. The medial parapatellar portion of the incision was reapproximated using interrupted sutures of #1 Vicryl. Subcutaneous tissue was approximated in layers using first #0 Vicryl followed #2-0 Vicryl. The skin was approximated with skin staples. A sterile dressing was applied.  The patient tolerated the procedure well and was transported to the recovery room in stable condition.    James P. Holley Bouche., M.D.

## 2016-10-26 NOTE — H&P (Signed)
The patient has been re-examined, and the chart reviewed, and there have been no interval changes to the documented history and physical.    The risks, benefits, and alternatives have been discussed at length. The patient expressed understanding of the risks benefits and agreed with plans for surgical intervention.  James P. Hooten, Jr. M.D.    

## 2016-10-26 NOTE — Progress Notes (Signed)
Pt tolerated dangling and getting out of the bed without difficulty this evening.

## 2016-10-26 NOTE — Transfer of Care (Signed)
Immediate Anesthesia Transfer of Care Note  Patient: Lindsay Russo  Procedure(s) Performed: Procedure(s): COMPUTER ASSISTED TOTAL KNEE ARTHROPLASTY (Left)  Patient Location: PACU  Anesthesia Type:General  Level of Consciousness: awake and alert   Airway & Oxygen Therapy: Patient Spontanous Breathing and Patient connected to face mask oxygen  Post-op Assessment: Report given to RN and Post -op Vital signs reviewed and stable  Post vital signs: Reviewed and stable  Last Vitals:  Vitals:   10/26/16 1037  BP: (!) 195/84  Pulse: 74  Resp: 18  Temp: 36.9 C    Last Pain:  Vitals:   10/26/16 1037  TempSrc: Oral         Complications: No apparent anesthesia complications

## 2016-10-26 NOTE — Anesthesia Procedure Notes (Signed)
Procedure Name: Intubation Date/Time: 10/26/2016 11:36 AM Performed by: Rosaria Ferries, Mileena Rothenberger Pre-anesthesia Checklist: Patient identified, Emergency Drugs available, Suction available and Patient being monitored Patient Re-evaluated:Patient Re-evaluated prior to inductionOxygen Delivery Method: Circle system utilized Preoxygenation: Pre-oxygenation with 100% oxygen Intubation Type: IV induction Laryngoscope Size: Mac and 3 Grade View: Grade I Tube size: 7.0 mm Number of attempts: 1 Airway Equipment and Method: LTA kit utilized Placement Confirmation: ETT inserted through vocal cords under direct vision,  positive ETCO2 and breath sounds checked- equal and bilateral Secured at: 21 cm Tube secured with: Tape Dental Injury: Teeth and Oropharynx as per pre-operative assessment

## 2016-10-26 NOTE — Anesthesia Preprocedure Evaluation (Addendum)
Anesthesia Evaluation  Patient identified by MRN, date of birth, ID band Patient awake    Reviewed: Allergy & Precautions, NPO status , Patient's Chart, lab work & pertinent test results, reviewed documented beta blocker date and time   History of Anesthesia Complications (+) POST - OP SPINAL HEADACHE and history of anesthetic complications  Airway Mallampati: III  TM Distance: >3 FB     Dental  (+) Chipped, Upper Dentures, Partial Lower   Pulmonary           Cardiovascular hypertension, Pt. on medications + Peripheral Vascular Disease       Neuro/Psych  Headaches, CVA, No Residual Symptoms    GI/Hepatic   Endo/Other    Renal/GU      Musculoskeletal  (+) Arthritis ,   Abdominal   Peds  Hematology   Anesthesia Other Findings Lumbar lam. Colon, br ca.EKG ok. Refuses spinal or any regional. No CVA, but had aTIA.  Reproductive/Obstetrics                           Anesthesia Physical Anesthesia Plan  ASA: III  Anesthesia Plan: Spinal   Post-op Pain Management:    Induction:   Airway Management Planned:   Additional Equipment:   Intra-op Plan:   Post-operative Plan:   Informed Consent: I have reviewed the patients History and Physical, chart, labs and discussed the procedure including the risks, benefits and alternatives for the proposed anesthesia with the patient or authorized representative who has indicated his/her understanding and acceptance.     Plan Discussed with: CRNA  Anesthesia Plan Comments:         Anesthesia Quick Evaluation

## 2016-10-26 NOTE — Brief Op Note (Signed)
10/26/2016  2:52 PM  PATIENT:  Lindsay Russo  70 y.o. female  PRE-OPERATIVE DIAGNOSIS:  PRIMARY OSTEOARTHRITIS LEFT KNEE  POST-OPERATIVE DIAGNOSIS:  PRIMARY OSTEOARTHRITIS LEFT KNEE  PROCEDURE:  Procedure(s): COMPUTER ASSISTED TOTAL KNEE ARTHROPLASTY (Left)  SURGEON:  Surgeon(s) and Role:    * Dereck Leep, MD - Primary  ASSISTANTS: Vance Peper, PA   ANESTHESIA:   general  EBL:  Total I/O In: 1000 [I.V.:1000] Out: 235 [Urine:185; Blood:50]  BLOOD ADMINISTERED:none  DRAINS: 2 medium drains to a reinfusion system   LOCAL MEDICATIONS USED:  MARCAINE    and OTHER Exparel  SPECIMEN:  No Specimen  DISPOSITION OF SPECIMEN:  N/A  COUNTS:  YES  TOURNIQUET: 87 min  DICTATION: .Dragon Dictation  PLAN OF CARE: Admit to inpatient   PATIENT DISPOSITION:  PACU - hemodynamically stable.   Delay start of Pharmacological VTE agent (>24hrs) due to surgical blood loss or risk of bleeding: yes

## 2016-10-27 ENCOUNTER — Encounter: Payer: Self-pay | Admitting: Orthopedic Surgery

## 2016-10-27 LAB — CBC
HEMATOCRIT: 38.5 % (ref 35.0–47.0)
HEMOGLOBIN: 13 g/dL (ref 12.0–16.0)
MCH: 30.4 pg (ref 26.0–34.0)
MCHC: 33.8 g/dL (ref 32.0–36.0)
MCV: 90.1 fL (ref 80.0–100.0)
Platelets: 218 10*3/uL (ref 150–440)
RBC: 4.27 MIL/uL (ref 3.80–5.20)
RDW: 13.3 % (ref 11.5–14.5)
WBC: 17.6 10*3/uL — AB (ref 3.6–11.0)

## 2016-10-27 LAB — BASIC METABOLIC PANEL
ANION GAP: 10 (ref 5–15)
BUN: 19 mg/dL (ref 6–20)
CHLORIDE: 105 mmol/L (ref 101–111)
CO2: 22 mmol/L (ref 22–32)
Calcium: 8.1 mg/dL — ABNORMAL LOW (ref 8.9–10.3)
Creatinine, Ser: 1.35 mg/dL — ABNORMAL HIGH (ref 0.44–1.00)
GFR calc non Af Amer: 39 mL/min — ABNORMAL LOW (ref >60)
GFR, EST AFRICAN AMERICAN: 45 mL/min — AB (ref >60)
Glucose, Bld: 159 mg/dL — ABNORMAL HIGH (ref 65–99)
POTASSIUM: 3.7 mmol/L (ref 3.5–5.1)
Sodium: 137 mmol/L (ref 135–145)

## 2016-10-27 NOTE — Progress Notes (Signed)
Clinical Social Worker (CSW) received SNF consult. PT is recommending home health. RN case manager is aware of above. Please reconsult if future social work needs arise. CSW signing off.   Stanley Lyness, LCSW [phone] 

## 2016-10-27 NOTE — Discharge Summary (Signed)
Physician Discharge Summary  Patient ID: Lindsay Russo MRN: [id number] DOB/AGE: 1946-11-15 70 y.o.  Admit date: 10/26/2016 Discharge date: 10/28/2016  Admission Diagnoses:  PRIMARY OSTEOARTHRITIS LEFT KNEE   Discharge Diagnoses: Patient Active Problem List   Diagnosis Date Noted  . S/P total knee arthroplasty 10/26/2016  . Allergic rhinitis 11/21/2015  . Personal history of urinary calculi 11/21/2015  . History of migraine 11/21/2015  . CN (constipation) 11/21/2015  . Arthritis 11/21/2015  . Vertigo 05/13/2015  . Senile purpura ([place]) 05/13/2015  . Cerebrovascular disease 03/26/2015  . Degenerative disc disease, lumbar 03/26/2015  . Obesity 03/26/2015  . Hyperlipidemia, mixed 03/26/2015  . Hypertension 03/26/2015  . Pre-diabetes 03/26/2015  . History of left breast cancer 08/21/2014  . Lobular carcinoma in situ 09/26/2013  . Breast neoplasm, Tis (LCIS) 09/12/2013  . Breast microcalcification, mammographic 07/26/2013  . History of CVA (cerebrovascular accident) 09/17/2010  . Malignant neoplasm of ascending colon ([place]) 09/30/2006  . History of colon cancer 09/30/2006  . DDD (degenerative disc disease), lumbar 11/30/1998    Past Medical History:  Diagnosis Date  . Arthritis   . History of kidney stones   . Hypertension   . LCIS September 05, 2013   Left breast, ER 90%, PR 60%  . Malignant neoplasm of ascending colon [facility]) November 2007   T2, N0.5.2 cm low-grade adenocarcinoma. 0/28 nodes positive.  . Personal history of colonic polyps   . Spinal headache 1995   at [facility] spinal made me feel dead, had no control.  . Stroke ([place]) 09/17/2010   Right thalamic without residual effects     Transfusion: No transfusions given doing this admission   Consultants (if any):  case management for home health assistance  Discharged Condition: Improved  Hospital Course: Lindsay Russo is an 70 y.o. female who was admitted 10/26/2016 with a diagnosis of degenerative arthrosis  left knee and went to the operating room on 10/26/2016 and underwent the above named procedures.    Surgeries:Procedure(s): COMPUTER ASSISTED TOTAL KNEE ARTHROPLASTY on 10/26/2016  PRE-OPERATIVE DIAGNOSIS: Degenerative arthrosis of the left knee, primary  POST-OPERATIVE DIAGNOSIS:  Same  PROCEDURE:  Left total knee arthroplasty using computer-assisted navigation  SURGEON:  Marciano Sequin. M.D.  ASSISTANT:  Vance Peper, PA (present and scrubbed throughout the case, critical for assistance with exposure, retraction, instrumentation, and closure)  ANESTHESIA: general  ESTIMATED BLOOD LOSS: 50 mL  FLUIDS REPLACED: 1000 mL of crystalloid  TOURNIQUET TIME: 87 minutes  DRAINS: 2 medium drains to a reinfusion system  SOFT TISSUE RELEASES: Anterior cruciate ligament, posterior cruciate ligament, deep medial collateral ligament, patellofemoral ligament  IMPLANTS UTILIZED: DePuy Attune size 5N posterior stabilized femoral component (cemented), size 5 rotating platform tibial component (cemented), 38 mm medialized dome patella (cemented), and a 5 mm stabilized rotating platform polyethylene insert.  INDICATIONS FOR SURGERY: Lindsay Russo is a 70 y.o. year old female with a long history of progressive knee pain. X-rays demonstrated severe degenerative changes in tricompartmental fashion. The patient had not seen any significant improvement despite conservative nonsurgical intervention. After discussion of the risks and benefits of surgical intervention, the patient expressed understanding of the risks benefits and agree with plans for total knee arthroplasty.   The risks, benefits, and alternatives were discussed at length including but not limited to the risks of infection, bleeding, nerve injury, stiffness, blood clots, the need for revision surgery, cardiopulmonary complications, among others, and they were willing to proceed.  Patient tolerated the surgery well. No  complications .  Patient was taken to PACU where she was stabilized and then transferred to the orthopedic floor.  Patient started on Lovenox 30 mg q 12 hrs. Foot pumps applied bilaterally at 80 mm hg. Heels elevated off bed with rolled towels. No evidence of DVT. Calves non tender. Negative Homan. Physical therapy started on day #1 for gait training and transfer with OT starting on  day #1 for ADL and assisted devices. Patient has done well with therapy. Ambulated greater than 200 feet upon being discharged. Was able to ascend and descend 4 steps safely and independently  Patient's IV and Foley were discontinued on day #1 with Hemovac being discontinued on day #2 along with dressing change.   She was given perioperative antibiotics:  Anti-infectives    Start     Dose/Rate Route Frequency Ordered Stop   10/26/16 1800  clindamycin (CLEOCIN) IVPB 600 mg     600 mg 100 mL/hr over 30 Minutes Intravenous Every 6 hours 10/26/16 1624 10/27/16 1759   10/26/16 0933  clindamycin (CLEOCIN) 900 MG/50ML IVPB    Comments:  Hallaji, Violet Ann: cabinet override      10/26/16 0933 10/26/16 1142   10/26/16 0108  clindamycin (CLEOCIN) IVPB 900 mg  Status:  Discontinued     900 mg 100 mL/hr over 30 Minutes Intravenous On call to O.R. 10/26/16 0108 10/26/16 1016    .  She was fitted with AV 1 compression foot pump devices, instructed on heel pumps, early ambulation, and TED stockings bilaterally for DVT prophylaxis.  She benefited maximally from the hospital stay and there were no complications.    Recent vital signs:  Vitals:   10/27/16 0009 10/27/16 0446  BP: 140/85 124/64  Pulse: 86 77  Resp: 16 16  Temp: 98.7 F (37.1 C) 98 F (36.7 C)    Recent laboratory studies:  Lab Results  Component Value Date   HGB 13.0 10/27/2016   HGB 14.4 10/15/2016   HGB 14.0 11/12/2014   Lab Results  Component Value Date   WBC 17.6 (H) 10/27/2016   PLT 218 10/27/2016   Lab Results  Component Value Date    INR 0.99 10/15/2016   Lab Results  Component Value Date   NA 137 10/27/2016   K 3.7 10/27/2016   CL 105 10/27/2016   CO2 22 10/27/2016   BUN 19 10/27/2016   CREATININE 1.35 (H) 10/27/2016   GLUCOSE 159 (H) 10/27/2016    Discharge Medications:     Medication List    TAKE these medications   acetaminophen 500 MG tablet Commonly known as:  TYLENOL Take 1,000 mg by mouth every 6 (six) hours as needed for moderate pain or headache.   ALIVE ONCE DAILY WOMENS Tabs Take 1 tablet by mouth daily.   amLODipine 10 MG tablet Commonly known as:  NORVASC TAKE ONE (1) TABLET BY MOUTH EVERY DAY What changed:  See the new instructions.   bismuth subsalicylate 99991111 [id number] suspension Commonly known as:  PEPTO BISMOL Take 30 mLs by mouth every 6 (six) hours as needed for indigestion.   chlorthalidone 25 MG tablet Commonly known as:  HYGROTON TAKE ONE (1) TABLET BY MOUTH EVERY DAY   diclofenac sodium 1 % Gel Commonly known as:  VOLTAREN Apply 1 application topically 2 (two) times daily.   diphenhydrAMINE 25 MG tablet Commonly known as:  BENADRYL Take 25 mg by mouth daily as needed for allergies.   enoxaparin 40 MG/0.4ML injection Commonly known as:  LOVENOX Inject 0.4 mLs (40  mg total) into the skin daily.   lovastatin 40 MG tablet Commonly known as:  MEVACOR TAKE ONE TABLET AT BEDTIME   meclizine 25 MG tablet Commonly known as:  ANTIVERT Take 1 tablet (25 mg total) by mouth 3 (three) times daily as needed for dizziness.   oxyCODONE 5 MG immediate release tablet Commonly known as:  Oxy IR/ROXICODONE Take 1-2 tablets (5-10 mg total) by mouth every 4 (four) hours as needed for severe pain or breakthrough pain.   promethazine 25 MG tablet Commonly known as:  PHENERGAN Take 1 tablet (25 mg total) by mouth every 8 (eight) hours as needed for nausea or vomiting.   tamoxifen 20 MG tablet Commonly known as:  NOLVADEX TAKE ONE (1) TABLET BY MOUTH EVERY DAY   traMADol 50 MG  tablet Commonly known as:  ULTRAM Take 1-2 tablets (50-100 mg total) by mouth every 4 (four) hours as needed for moderate pain.            Durable Medical Equipment        Start     Ordered   10/26/16 1625  DME Walker rolling  Once    Question:  Patient needs a walker to treat with the following condition  Answer:  Total knee replacement status   10/26/16 1624   10/26/16 1625  DME Bedside commode  Once    Question:  Patient needs a bedside commode to treat with the following condition  Answer:  Total knee replacement status   10/26/16 1624      Diagnostic Studies: Dg Knee Left Port  Result Date: 10/26/2016 CLINICAL DATA:  Total knee replacement. EXAM: PORTABLE LEFT KNEE - 1-2 VIEW COMPARISON:  No recent prior. FINDINGS: Total left knee replacement. Normal alignment. Hardware intact. No acute abnormality. IMPRESSION: Total left knee replacement with good anatomic alignment. Electronically Signed   By: [place]   On: 10/26/2016 15:10    Disposition:     Follow-up Information    WOLFE,JON R., PA On 11/10/2016.   Specialty:  Physician Assistant Why:  at 1:15pm Contact information: [place] [id number] [phone]        Dereck Leep, MD On 12/08/2016.   Specialty:  Orthopedic Surgery Why:  at 9:30am Contact information: [place] [address] [phone]            Signed: Watt Climes. 10/27/2016, 7:30 AM

## 2016-10-27 NOTE — Progress Notes (Signed)
ORTHOPAEDICS PROGRESS NOTE  PATIENT NAME: Lindsay Russo DOB: 1946-11-29  MRN: [id number]  POD # 1: Left total knee arthroplasty  Subjective: The patient is up in the chair this morning. Pain is well-controlled. Patient was noted to have elevated blood pressure last night but has normalized.  Objective: Vital signs in last 24 hours: Temp:  [98 F (36.7 C)-99 F (37.2 C)] 98 F (36.7 C) (11/28 0446) Pulse Rate:  [74-95] 77 (11/28 0446) Resp:  [14-26] 16 (11/28 0446) BP: (124-195)/(60-99) 124/64 (11/28 0446) SpO2:  [91 %-100 %] 91 % (11/28 0446) FiO2 (%):  [21 %] 21 % (11/27 1625) Weight:  [104.3 kg (230 lb)] 104.3 kg (230 lb) (11/27 1037)  Intake/Output from previous day: 11/27 0701 - 11/28 0700 In: 3253.3 [P.O.:340; I.V.:2463.3; IV Piggyback:450] Out: 2360 [Urine:1985; Drains:325; Blood:50]   Recent Labs  10/27/16 0530  WBC 17.6*  HGB 13.0  HCT 38.5  PLT 218  K 3.7  CL 105  CO2 22  BUN 19  CREATININE 1.35*  GLUCOSE 159*  CALCIUM 8.1*    EXAM General: Awake and alert. Lungs: clear to auscultation Cardiac: normal rate, regular rhythm, normal S1, S2, no murmurs, rubs, clicks or gallops, normal rate and regular rhythm Left lower extremity: Dressing is dry and intact. Patient is able to perform independent straight leg raise. Homans test is negative. Neurologic: Awake, alert, and oriented. Sensory and motor function are grossly intact  Assessment: Left total knee arthroplasty  Secondary diagnoses: Hypertension Nephrolithiasis History of CVA History of breast cancer  Plan: Begin PT and OT as per total knee arthroplasty rehabilitation protocol. Today's goal were reviewed with the patient.  Plan is to go Home after hospital stay. DVT Prophylaxis - Lovenox, Foot Pumps and TED hose  Repeat labs in the morning.  James P. Hooten, Jr. M.D.   1

## 2016-10-27 NOTE — Discharge Instructions (Signed)

## 2016-10-27 NOTE — Care Management Note (Signed)
Case Management Note  Patient Details  Name: Lindsay Russo MRN: [id number] Date of Birth: [birth year]  Subjective/Objective:  POD # 1 left TKA. Met with patient and her spouse Fritz Pickerel [phone]) at the bed side to discuss discharge planning. Patient is normally independent with adls. She has a walker at home. Offered a choice of home health agencies and she chose [facility] because she has used then in the past and states they were wonderful. Called Lovenox 40 mg # 14, no refills to [facility] [phone]).  PCP is Dr. Caryn Section.                Action/Plan: [facility] for HH PT. Lovenox called in  Expected Discharge Date:                  Expected Discharge Plan:  [place]  In-House Referral:     Discharge planning Services  CM Consult  Post Acute Care Choice:  Home Health Choice offered to:  Patient  DME Arranged:    DME Agency:     HH Arranged:  PT [place]:  [place]  Status of Service:  In process, will continue to follow  If discussed at Long Length of Stay Meetings, dates discussed:    Additional Comments:  Jolly Mango, RN 10/27/2016, 2:04 PM

## 2016-10-27 NOTE — Evaluation (Signed)
Physical Therapy Evaluation Patient Details Name: Lindsay Russo MRN: [id number] DOB: [birth year] Today's Date: 10/27/2016   History of Present Illness  Pt. is a 70 y.o. female who was admitted to [facility] for a Left TKR.  Clinical Impression  Pt did very well with initial PT encounter with ability to do SLRs w/o warm up, ambulating ~100 ft with consistent and safe cadence, knee ROM 0-81 and moderate pain w/o the session.  She showed great effort with all requested acts and is at or above expected POD1 levels with relative ease.  Pt had expected post-op pain but was not limited by this.  Pt expects to go home and should be able to do so safely with HHPT.     Follow Up Recommendations Home health PT    Equipment Recommendations       Recommendations for Other Services       Precautions / Restrictions Precautions Precautions: Fall Restrictions Weight Bearing Restrictions: No (L WBAT)      Mobility  Bed Mobility               General bed mobility comments: Pt in recliner on arrival, not tested  Transfers Overall transfer level: Independent Equipment used: Rolling walker (2 wheeled)             General transfer comment: Pt able to rise w/o assist, shows good ability to use L LE   Ambulation/Gait Ambulation/Gait assistance: Min guard Ambulation Distance (Feet): 100 Feet Assistive device: Rolling walker (2 wheeled)       General Gait Details: Pt took only a few initial steps to warm up and was able to maintain good forward walker motion, had no L knee buckling and generally showed good safety and confidence.   Stairs            Wheelchair Mobility    Modified Rankin (Stroke Patients Only)       Balance Overall balance assessment: Modified Independent                                           Pertinent Vitals/Pain Pain Assessment: 0-10 Pain Score: 6  Pain Location: L knee    Home Living Family/patient expects to be discharged  to:: Private residence Living Arrangements: Spouse/significant other;Children Available Help at Discharge: Family   Home Access: Stairs to enter Entrance Stairs-Rails:  (handle to hold) Technical brewer of Steps: 2   Home Equipment: Environmental consultant - 2 wheels      Prior Function Level of Independence: Independent         Comments: Pt has been using a walker recently, but generally is active and able to do all she needs     Hand Dominance        Extremity/Trunk Assessment   Upper Extremity Assessment: Overall WFL for tasks assessed           Lower Extremity Assessment: Overall WFL for tasks assessed (Pt able to do SLRs on L, LAQ against resistance)         Communication   Communication: No difficulties  Cognition Arousal/Alertness: Awake/alert Behavior During Therapy: WFL for tasks assessed/performed Overall Cognitive Status: Within Functional Limits for tasks assessed                      General Comments      Exercises Total Joint Exercises Ankle Circles/Pumps: AROM;10 reps  Quad Sets: Strengthening;10 reps Gluteal Sets: Strengthening;10 reps Heel Slides: Strengthening;10 reps Hip ABduction/ADduction: Strengthening;10 reps Straight Leg Raises: AROM;10 reps Long Arc Quad: Strengthening;10 reps Knee Flexion: PROM;5 reps Goniometric ROM: 0-81   Assessment/Plan    PT Assessment Patient needs continued PT services  PT Problem List Decreased strength;Decreased range of motion;Decreased activity tolerance;Decreased balance;Decreased mobility;Decreased knowledge of use of DME;Decreased safety awareness          PT Treatment Interventions DME instruction;Gait training;Stair training;Functional mobility training;Therapeutic activities;Therapeutic exercise;Balance training;Patient/family education    PT Goals (Current goals can be found in the Care Plan section)  Acute Rehab PT Goals Patient Stated Goal: go home PT Goal Formulation: With  patient Time For Goal Achievement: 11/10/16 Potential to Achieve Goals: Good    Frequency BID   Barriers to discharge        Co-evaluation               End of Session Equipment Utilized During Treatment: Gait belt Activity Tolerance: Patient tolerated treatment well Patient left: with chair alarm set;with call bell/phone within reach           Time: 0955-1026 PT Time Calculation (min) (ACUTE ONLY): 31 min   Charges:   PT Evaluation $PT Eval Low Complexity: 1 Procedure PT Treatments $Therapeutic Exercise: 8-22 mins   PT G Codes:        Kreg Shropshire, DPT 10/27/2016, 11:31 AM

## 2016-10-27 NOTE — Progress Notes (Signed)
Physical Therapy Treatment Patient Details Name: Lindsay Russo MRN: [id number] DOB: [birth year] Today's Date: 10/27/2016    History of Present Illness Pt. is a 70 y.o. female who was admitted to [facility] for a Left TKR.    PT Comments    Pt agreeable to PT. Pt reports 6/10 pain, but tolerable. Pt progressing nicely with all mobility. Demonstrates out of bed with Mod I and return to bed with Min A for lower extremities in hook technique position. Pt ambulating well with no left knee buckling or loss of balance. Pt progressing all exercises and given a written home exercise program booklet; encouraged to perform exercises 3-4 time a day at least. Continue PT to progress range, strength and endurance to improve functional mobility.   Follow Up Recommendations  Home health PT     Equipment Recommendations       Recommendations for Other Services       Precautions / Restrictions Precautions Precautions: Fall Restrictions Weight Bearing Restrictions: Yes Other Position/Activity Restrictions: WBAT LLE    Mobility  Bed Mobility Overal bed mobility: Needs Assistance Bed Mobility: Sit to Supine;Supine to Sit     Supine to sit: Modified independent (Device/Increase time) Sit to supine: Min assist   General bed mobility comments: Use of rails to sit; assist for LEs in hook position to return to bed  Transfers Overall transfer level: Modified independent Equipment used: Rolling walker (2 wheeled)             General transfer comment: Good use of hands from bed and commode. Safe technique without knee buckling  Ambulation/Gait Ambulation/Gait assistance: Min guard Ambulation Distance (Feet): 135 Feet (to/from bathroom 25 ft) Assistive device: Rolling walker (2 wheeled) Gait Pattern/deviations: Step-through pattern Gait velocity: reduced Gait velocity interpretation: Below normal speed for age/gender General Gait Details: Mildly stiff legged LLE pattern, but good stability and  fluidity throughout   Stairs            Wheelchair Mobility    Modified Rankin (Stroke Patients Only)       Balance Overall balance assessment: Needs assistance Sitting-balance support: No upper extremity supported;Feet supported Sitting balance-Leahy Scale: Normal     Standing balance support: Bilateral upper extremity supported Standing balance-Leahy Scale: Fair                      Cognition Arousal/Alertness: Awake/alert Behavior During Therapy: WFL for tasks assessed/performed Overall Cognitive Status: Within Functional Limits for tasks assessed                      Exercises Total Joint Exercises Quad Sets: Strengthening;Both;20 reps;Supine (10 in stand with wt shift pre gait) Straight Leg Raises: Strengthening;Left;10 reps;Supine Long Arc Quad: Left;AAROM;10 reps;Seated Knee Flexion: AROM;Left;10 reps;Seated (3 positions each rep with 10 sec hold for stretch )    General Comments        Pertinent Vitals/Pain Pain Assessment: 0-10 Pain Score: 6  Pain Location: L knee Pain Intervention(s): Premedicated before session;Repositioned;Ice applied    Home Living Family/patient expects to be discharged to:: Private residence Living Arrangements: Spouse/significant other;Children Available Help at Discharge: Family   Home Access: Stairs to enter   Home Layout: One level Home Equipment: Environmental consultant - 2 wheels;Tub bench;Grab bars - tub/shower (reacher)      Prior Function Level of Independence: Independent          PT Goals (current goals can now be found in the care plan section) Acute Rehab  PT Goals Patient Stated Goal: To return home Progress towards PT goals: Progressing toward goals    Frequency    BID      PT Plan Current plan remains appropriate    Co-evaluation             End of Session Equipment Utilized During Treatment: Gait belt Activity Tolerance: Patient tolerated treatment well Patient left: in bed;with  call bell/phone within reach;with bed alarm set;with SCD's reapplied;Other (comment) (polar care in place)     Time: [id number] PT Time Calculation (min) (ACUTE ONLY): 31 min  Charges:  $Gait Training: 8-22 mins $Therapeutic Exercise: 8-22 mins                    G Codes:      Larae Grooms, PTA 10/27/2016, 3:21 PM

## 2016-10-27 NOTE — Evaluation (Signed)
Occupational Therapy Evaluation Patient Details Name: Lindsay Russo MRN: [id number] DOB: 07-17-1946 Today's Date: 10/27/2016    History of Present Illness Pt. is a 70 y.o. female who was admitted to [facility] for a Left TKR.   Clinical Impression   Pt. Is a 70 y.o. Female who was admitted to [facility] for a Left TKR. Pt. has all necessary A//E in place, and was using a reacher to assist with LE ADLs prior to admission. Pt. has a support system in place to assist with meals once returning home. No further OT services are warranted at this time. Will complete the order.    Follow Up Recommendations       Equipment Recommendations       Recommendations for Other Services       Precautions / Restrictions Precautions Precautions: Fall Restrictions Weight Bearing Restrictions: No (L WBAT)         Balance Overall balance assessment: Modified Independent                                          ADL Overall ADL's : Needs assistance/impaired                                       General ADL Comments: Pt. education was provided about A/E use for LE ADLs. Pt. has the necessary equipment in place. Pt. was using a reacher to assist with LE dressing prior to admission.      Vision     Perception     Praxis      Pertinent Vitals/Pain Pain Assessment: 0-10 Pain Score: 7  Pain Location: Left Knee     Hand Dominance Right   Extremity/Trunk Assessment Upper Extremity Assessment Upper Extremity Assessment: Overall WFL for tasks assessed       Communication Communication Communication: No difficulties   Cognition Arousal/Alertness: Awake/alert Behavior During Therapy: WFL for tasks assessed/performed Overall Cognitive Status: Within Functional Limits for tasks assessed                     General Comments          Shoulder Instructions      Home Living Family/patient expects to be discharged to:: Private residence Living  Arrangements: Spouse/significant other;Children Available Help at Discharge: Family   Home Access: Stairs to enter Technical brewer of Steps: 2 (From the den to the [employer] Entrance Stairs-Rails:  (handle to hold) Home Layout: One level               Home Equipment: Environmental consultant - 2 wheels;Tub bench;Grab bars - tub/shower (reacher)          Prior Functioning/Environment Level of Independence: Independent        Comments: Pt has been using a walker recently, but generally is active and able to do all she needs        OT Problem List:     OT Treatment/Interventions:      OT Goals(Current goals can be found in the care plan section) Acute Rehab OT Goals Patient Stated Goal: To return home OT Goal Formulation: With patient Potential to Achieve Goals: Good  OT Frequency:     Barriers to D/C:            Co-evaluation  End of Session    Activity Tolerance: Patient tolerated treatment well Patient left: in chair;with call bell/phone within reach;with chair alarm set   Time: 1031-1046 OT Time Calculation (min): 15 min Charges:  OT General Charges $OT Visit: 1 Procedure OT Evaluation $OT Eval Low Complexity: 1 Procedure G-Codes:    Harrel Carina, MS, OTR/L 10/27/2016, 11:38 AM

## 2016-10-27 NOTE — NC FL2 (Signed)
[place] LEVEL OF CARE SCREENING TOOL     IDENTIFICATION  Patient Name: Lindsay Russo Birthdate: 10/16/1946 Sex: female Admission Date (Current Location): 10/26/2016  [place] and [place] Number:  Engineering geologist and Address:  [facility], [address] [place], [place] [address]      Provider Number: [id number]  Attending Physician Name and Address:  Dereck Leep, MD  Relative Name and Phone Number:       Current Level of Care: Hospital Recommended Level of Care: [place] Prior Approval Number:    Date Approved/Denied:   PASRR Number:  ([id number])  Discharge Plan: SNF    Current Diagnoses: Patient Active Problem List   Diagnosis Date Noted  . S/P total knee arthroplasty 10/26/2016  . Allergic rhinitis 11/21/2015  . Personal history of urinary calculi 11/21/2015  . History of migraine 11/21/2015  . CN (constipation) 11/21/2015  . Arthritis 11/21/2015  . Vertigo 05/13/2015  . Senile purpura ([place]) 05/13/2015  . Cerebrovascular disease 03/26/2015  . Degenerative disc disease, lumbar 03/26/2015  . Obesity 03/26/2015  . Hyperlipidemia, mixed 03/26/2015  . Hypertension 03/26/2015  . Pre-diabetes 03/26/2015  . History of left breast cancer 08/21/2014  . Lobular carcinoma in situ 09/26/2013  . Breast neoplasm, Tis (LCIS) 09/12/2013  . Breast microcalcification, mammographic 07/26/2013  . History of CVA (cerebrovascular accident) 09/17/2010  . Malignant neoplasm of ascending colon ([place]) 09/30/2006  . History of colon cancer 09/30/2006  . DDD (degenerative disc disease), lumbar 11/30/1998    Orientation RESPIRATION BLADDER Height & Weight     Self, Time, Situation, Place  Normal Continent Weight: 230 lb (104.3 kg) Height:  5\' 6"  (167.6 cm)  BEHAVIORAL SYMPTOMS/MOOD NEUROLOGICAL BOWEL NUTRITION STATUS   (None.)  (None.) Continent Diet (Diet: Regular )  AMBULATORY STATUS COMMUNICATION OF NEEDS Skin    Extensive Assist Verbally Surgical wounds (Incision: Left Knee)                       Personal Care Assistance Level of Assistance  Bathing, Feeding, Dressing Bathing Assistance: Limited assistance Feeding assistance: Independent Dressing Assistance: Limited assistance     Functional Limitations Info  Sight, Hearing, Speech Sight Info: Adequate Hearing Info: Adequate Speech Info: Adequate    SPECIAL CARE FACTORS FREQUENCY  PT (By licensed PT), OT (By licensed OT)     PT Frequency:  (5) OT Frequency:  (5)            Contractures      Additional Factors Info  Code Status, Allergies Code Status Info:  (Full Code) Allergies Info:  (Tegaderm Ag Mesh Silver, Amoxicillin, Sulfa Antibiotics)           Current Medications (10/27/2016):  This is the current hospital active medication list Current Facility-Administered Medications  Medication Dose Route Frequency Provider Last Rate Last Dose  . 0.9 %  sodium chloride infusion   Intravenous Continuous Dereck Leep, MD 100 mL/hr at 10/27/16 0034    . acetaminophen (OFIRMEV) IV 1,000 mg  1,000 mg Intravenous Q6H Dereck Leep, MD   1,000 mg at 10/27/16 0532  . acetaminophen (TYLENOL) tablet 650 mg  650 mg Oral Q6H PRN Dereck Leep, MD       Or  . acetaminophen (TYLENOL) suppository 650 mg  650 mg Rectal Q6H PRN Dereck Leep, MD      . alum & mag hydroxide-simeth (MAALOX/MYLANTA) 200-200-20 MG/5ML suspension 30 mL  30 mL  Oral Q4H PRN Dereck Leep, MD      . amLODipine (NORVASC) tablet 10 mg  10 mg Oral Daily Dereck Leep, MD   10 mg at 10/27/16 0859  . bisacodyl (DULCOLAX) suppository 10 mg  10 mg Rectal Daily PRN Dereck Leep, MD      . chlorthalidone (HYGROTON) tablet 25 mg  25 mg Oral Daily Dereck Leep, MD   Stopped at 10/27/16 0900  . clindamycin (CLEOCIN) IVPB 600 mg  600 mg Intravenous Q6H Dereck Leep, MD   600 mg at 10/27/16 0505  . diphenhydrAMINE (BENADRYL) 12.5 MG/5ML elixir 12.5-25 mg  12.5-25  mg Oral Q4H PRN Dereck Leep, MD      . enoxaparin (LOVENOX) injection 30 mg  30 mg Subcutaneous Q12H Dereck Leep, MD   30 mg at 10/27/16 0907  . ferrous sulfate tablet 325 mg  325 mg Oral BID WC Dereck Leep, MD   325 mg at 10/27/16 0905  . hydrALAZINE (APRESOLINE) injection 10 mg  10 mg Intravenous Q4H PRN Dereck Leep, MD   10 mg at 10/26/16 1824  . Influenza vac split quadrivalent PF (FLUARIX) injection 0.5 mL  0.5 mL Intramuscular Prior to discharge Dereck Leep, MD      . magnesium hydroxide (MILK OF MAGNESIA) suspension 30 mL  30 mL Oral Daily PRN Dereck Leep, MD      . meclizine (ANTIVERT) tablet 25 mg  25 mg Oral TID PRN Dereck Leep, MD      . menthol-cetylpyridinium (CEPACOL) lozenge 3 mg  1 lozenge Oral PRN Dereck Leep, MD       Or  . phenol (CHLORASEPTIC) mouth spray 1 spray  1 spray Mouth/Throat PRN Dereck Leep, MD      . metoCLOPramide (REGLAN) tablet 10 mg  10 mg Oral TID AC & HS Dereck Leep, MD   10 mg at 10/27/16 0905  . morphine 2 MG/ML injection 2 mg  2 mg Intravenous Q2H PRN Dereck Leep, MD   2 mg at 10/26/16 1847  . multivitamin with minerals tablet 1 tablet  1 tablet Oral Daily Dereck Leep, MD   1 tablet at 10/27/16 0905  . ondansetron (ZOFRAN) tablet 4 mg  4 mg Oral Q6H PRN Dereck Leep, MD       Or  . ondansetron (ZOFRAN) injection 4 mg  4 mg Intravenous Q6H PRN Dereck Leep, MD   4 mg at 10/26/16 1646  . oxyCODONE (Oxy IR/ROXICODONE) immediate release tablet 5-10 mg  5-10 mg Oral Q4H PRN Dereck Leep, MD   10 mg at 10/27/16 0905  . pantoprazole (PROTONIX) EC tablet 40 mg  40 mg Oral BID Dereck Leep, MD   40 mg at 10/27/16 0905  . pravastatin (PRAVACHOL) tablet 40 mg  40 mg Oral q1800 Dereck Leep, MD   40 mg at 10/26/16 1723  . senna-docusate (Senokot-S) tablet 1 tablet  1 tablet Oral BID Dereck Leep, MD   1 tablet at 10/27/16 0859  . sodium phosphate (FLEET) 7-19 GM/118ML enema 1 enema  1 enema Rectal Once PRN Dereck Leep,  MD      . tamoxifen (NOLVADEX) tablet 20 mg  20 mg Oral Daily Dereck Leep, MD   20 mg at 10/27/16 0900  . traMADol (ULTRAM) tablet 50-100 mg  50-100 mg Oral Q4H PRN Dereck Leep, MD   100 mg at  10/27/16 0532     Discharge Medications: Please see discharge summary for a list of discharge medications.  Relevant Imaging Results:  Relevant Lab Results:   Additional Information  (SSN: [id number])  Danie Chandler, Student-Social Work

## 2016-10-27 NOTE — Progress Notes (Signed)
Pt remained alert and oriented during the night. Pain controled by alternating oxycodone and Tramadol. Iv infused with difficulty. Eating and drinking without difficulty. Tolerated getting up in the chair this morning.

## 2016-10-28 LAB — BASIC METABOLIC PANEL
ANION GAP: 10 (ref 5–15)
BUN: 23 mg/dL — ABNORMAL HIGH (ref 6–20)
CALCIUM: 8.7 mg/dL — AB (ref 8.9–10.3)
CHLORIDE: 102 mmol/L (ref 101–111)
CO2: 24 mmol/L (ref 22–32)
CREATININE: 1.24 mg/dL — AB (ref 0.44–1.00)
GFR calc non Af Amer: 43 mL/min — ABNORMAL LOW (ref >60)
GFR, EST AFRICAN AMERICAN: 50 mL/min — AB (ref >60)
Glucose, Bld: 159 mg/dL — ABNORMAL HIGH (ref 65–99)
Potassium: 3.6 mmol/L (ref 3.5–5.1)
SODIUM: 136 mmol/L (ref 135–145)

## 2016-10-28 LAB — CBC
HEMATOCRIT: 38.3 % (ref 35.0–47.0)
HEMOGLOBIN: 12.8 g/dL (ref 12.0–16.0)
MCH: 29.8 pg (ref 26.0–34.0)
MCHC: 33.5 g/dL (ref 32.0–36.0)
MCV: 89.1 fL (ref 80.0–100.0)
Platelets: 174 10*3/uL (ref 150–440)
RBC: 4.3 MIL/uL (ref 3.80–5.20)
RDW: 13.3 % (ref 11.5–14.5)
WBC: 17.5 10*3/uL — AB (ref 3.6–11.0)

## 2016-10-28 MED ORDER — TRAMADOL HCL 50 MG PO TABS: 50.0000 mg | ORAL_TABLET | ORAL | 0 refills | Status: DC | PRN

## 2016-10-28 MED ORDER — OXYCODONE HCL 5 MG PO TABS: 5.0000 mg | ORAL_TABLET | ORAL | 0 refills | Status: DC | PRN

## 2016-10-28 MED ORDER — ENOXAPARIN SODIUM 40 MG/0.4ML ~~LOC~~ SOLN
40.0000 mg | SUBCUTANEOUS | 0 refills | Status: DC
Start: 2016-10-28 — End: 2017-01-25

## 2016-10-28 NOTE — Progress Notes (Signed)
On assessment, patient SOB on exertion. Patient complaining of pain of left knee 7 out of 10. Hemovac was removed this morning by PA. Surgical site clean, dry, and intact. Patient has +2 pedal pulses. Lungs sound clear. Herat sound normal. Patient stated her LBM was this morning. Patient walking to the bathroom with front wheel walker and 1 assist.   Deri Fuelling, RN

## 2016-10-28 NOTE — Care Management Note (Signed)
Case Management Note  Patient Details  Name: Lindsay Russo MRN: [id number] Date of Birth: 01-11-1946  Subjective/Objective:   Cost of Lovenox is $98.50. Patient updated.                  Action/Plan: Lovenox ready for pick up. Advanced notified of discharge.   Expected Discharge Date:    10/28/2016              Expected Discharge Plan:  [place]  In-House Referral:     Discharge planning Services  CM Consult  Post Acute Care Choice:  Home Health Choice offered to:  Patient  DME Arranged:    DME Agency:     HH Arranged:  PT [place]:  [place]  Status of Service:  Completed, signed off  If discussed at [place] of Stay Meetings, dates discussed:    Additional Comments:  Jolly Mango, RN 10/28/2016, 9:10 AM

## 2016-10-28 NOTE — Care Management Important Message (Signed)
Important Message  Patient Details  Name: Lindsay Russo MRN: [id number] Date of Birth: 1946-06-10   Medicare Important Message Given:  Yes    Jolly Mango, RN 10/28/2016, 9:09 AM

## 2016-10-28 NOTE — Progress Notes (Signed)
Physical Therapy Treatment Patient Details Name: Lindsay Russo MRN: [id number] DOB: [birth year] Today's Date: 10/28/2016    History of Present Illness Pt. is a 70 y.o. female who was admitted to [facility] for a Left TKR.    PT Comments    Pt agreeable and eager to go home. Pt reports increased pain today; 8/10 left knee. Pt/family education on open versus close knee position and positioning at home. Pt requires Min guard for transfers today due to increased hesitancy and time due to increased pain. Ambulation limited to to/from bathroom in order to conserve energy/manage pain, as pt discharging home today. Pt ambulates well with reciprocal pattern in slow, cautious pattern with close supervision and manages toileting/hygiene independently. Pt performs up/down 4 steps with Min guard and min cues for sequencing/left lower extremity management while ascending; overall performs well and safely. Pt maintaining range 0-80 degrees and re-educated on stretching/strengthening for carry over at home regarding technique, repetitions, duration and frequency for optimal gains/progression. Pt's spouse has question regarding CP machine and how to use/hook up; spouse educated and observed performing to demonstrate learning/ability. All questions/concerns at this time met and pt/family satisfied. Pt to discharge home with home health PT and family supervision.   Follow Up Recommendations  Home health PT     Equipment Recommendations       Recommendations for Other Services       Precautions / Restrictions Precautions Precautions: Fall Restrictions Weight Bearing Restrictions: Yes Other Position/Activity Restrictions: WBAT LLE    Mobility  Bed Mobility Overal bed mobility: Needs Assistance Bed Mobility: Supine to Sit;Sit to Supine     Supine to sit: Modified independent (Device/Increase time) Sit to supine: Min assist   General bed mobility comments: Increased time/effort to sit; Min A for LEs to  return to bed  Transfers Overall transfer level: Needs assistance Equipment used: Rolling walker (2 wheeled) Transfers: Sit to/from Stand Sit to Stand: Min guard         General transfer comment: Min guard due to pt being a little more tentative today due to increased pain  Ambulation/Gait Ambulation/Gait assistance: Min guard Ambulation Distance (Feet): 30 Feet (20 second walk) Assistive device: Rolling walker (2 wheeled) Gait Pattern/deviations: Step-through pattern Gait velocity: reduced Gait velocity interpretation: Below normal speed for age/gender General Gait Details: walking to/from bathroom only today; limited due to increased pain and therapist requesting pt perform steps before returning home and conserving energy/pain management with return home today   Stairs Stairs: Yes Stairs assistance: Min guard Stair Management: Two rails Number of Stairs: 4 General stair comments: Min reminder for sequence and cues to flex L knee when elevating to next step; pt performs well.   Wheelchair Mobility    Modified Rankin (Stroke Patients Only)       Balance Overall balance assessment: Needs assistance Sitting-balance support: Feet supported;No upper extremity supported Sitting balance-Leahy Scale: Normal     Standing balance support: Bilateral upper extremity supported Standing balance-Leahy Scale: Fair                      Cognition Arousal/Alertness: Awake/alert Behavior During Therapy: WFL for tasks assessed/performed Overall Cognitive Status: Within Functional Limits for tasks assessed                      Exercises Total Joint Exercises Quad Sets: Strengthening;Both;10 reps;Standing Knee Flexion: AROM;Left;10 reps;Seated (3 positions each rep with 10 sec hold each) Goniometric ROM: 0 to 80  General Comments        Pertinent Vitals/Pain Pain Assessment: 0-10 Pain Score: 8  Pain Location: L knee Pain Intervention(s): Monitored  during session;Premedicated before session;Ice applied    Home Living                      Prior Function            PT Goals (current goals can now be found in the care plan section) Progress towards PT goals: Progressing toward goals    Frequency    BID      PT Plan Current plan remains appropriate    Co-evaluation             End of Session Equipment Utilized During Treatment: Gait belt Activity Tolerance: Patient tolerated treatment well Patient left: in bed;with call bell/phone within reach;with family/visitor present;Other (comment) (polar care in place)     Time: 8628-2417 PT Time Calculation (min) (ACUTE ONLY): 38 min  Charges:  $Gait Training: 8-22 mins $Therapeutic Exercise: 8-22 mins $Therapeutic Activity: 8-22 mins                    G Codes:      Larae Grooms, PTA 10/28/2016, 11:24 AM

## 2016-10-28 NOTE — Progress Notes (Signed)
Mylinda Latina to be D/C'd Home per MD order.  Discussed with the patient and all questions fully answered.  VSS, Skin clean, dry and intact without evidence of skin break down, no evidence of skin tears noted. IV catheter discontinued intact. Site without signs and symptoms of complications. Dressing and pressure applied.  An After Visit Summary was printed and given to the patient. Patient received prescription.  D/c education completed with patient/family including follow up instructions, medication list, d/c activities limitations if indicated, with other d/c instructions as indicated by MD - patient able to verbalize understanding, all questions fully answered.   Patient instructed to return to ED, call 911, or call MD for any changes in condition.   Patient escorted via [place], and D/C home via private auto.  Threasa Beards [place] 10/28/2016 11:08 AM

## 2016-10-28 NOTE — Progress Notes (Signed)
   Subjective: 2 Days Post-Op Procedure(s) (LRB): COMPUTER ASSISTED TOTAL KNEE ARTHROPLASTY (Left) Patient reports pain as moderate.   Patient is well, and has had no acute complaints or problems Continue with physical  therapy today.  Plan is to go Home after hospital stay. no nausea and no vomiting Patient denies any chest pains or shortness of breath. Objective: Vital signs in last 24 hours: Temp:  [98 F (36.7 C)-98.5 F (36.9 C)] 98.2 F (36.8 C) (11/29 0343) Pulse Rate:  [75-97] 97 (11/29 0343) Resp:  [18-20] 18 (11/29 0343) BP: (127-166)/(57-88) 166/84 (11/29 0343) SpO2:  [92 %-99 %] 93 % (11/29 0343) well approximated incision Heels are non tender and elevated off the bed using rolled towels Intake/Output from previous day: 11/28 0701 - 11/29 0700 In: 2413.3 [P.O.:1200; I.V.:1213.3] Out: 300 [Drains:300] Intake/Output this shift: No intake/output data recorded.   Recent Labs  10/27/16 0530 10/28/16 0543  HGB 13.0 12.8    Recent Labs  10/27/16 0530 10/28/16 0543  WBC 17.6* 17.5*  RBC 4.27 4.30  HCT 38.5 38.3  PLT 218 174    Recent Labs  10/27/16 0530 10/28/16 0543  NA 137 136  K 3.7 3.6  CL 105 102  CO2 22 24  BUN 19 23*  CREATININE 1.35* 1.24*  GLUCOSE 159* 159*  CALCIUM 8.1* 8.7*   No results for input(s): LABPT, INR in the last 72 hours.  EXAM General - Patient is Alert, Appropriate and Oriented Extremity - Neurologically intact Neurovascular intact Sensation intact distally Intact pulses distally Dorsiflexion/Plantar flexion intact No cellulitis present Compartment soft Dressing - dressing C/D/I and moderate drainage at the hemovac site Motor Function - intact, moving foot and toes well on exam.    Past Medical History:  Diagnosis Date  . Arthritis   . History of kidney stones   . Hypertension   . LCIS September 05, 2013   Left breast, ER 90%, PR 60%  . Malignant neoplasm of ascending colon [facility]) November 2007   T2, N0.5.2 cm  low-grade adenocarcinoma. 0/28 nodes positive.  . Personal history of colonic polyps   . Spinal headache 1995   at [facility] spinal made me feel dead, had no control.  . Stroke ([place]) 09/17/2010   Right thalamic without residual effects    Assessment/Plan: 2 Days Post-Op Procedure(s) (LRB): COMPUTER ASSISTED TOTAL KNEE ARTHROPLASTY (Left) Active Problems:   S/P total knee arthroplasty  Estimated body mass index is 37.12 kg/m as calculated from the following:   Height as of this encounter: 5\' 6"  (1.676 m).   Weight as of this encounter: 104.3 kg (230 lb). Up with therapy Discharge home with home health  Labs: reviewed DVT Prophylaxis - Lovenox, Foot Pumps and TED hose Weight-Bearing as tolerated to left leg Hemovac was discontinued Change dressing prior to d/c  Needs to do the lap around the station and steps prior to d/c  Jon R. Longstreet [place] 10/28/2016, 7:06 AM

## 2016-10-29 DIAGNOSIS — Z471 Aftercare following joint replacement surgery: Secondary | ICD-10-CM | POA: Diagnosis not present

## 2016-10-29 DIAGNOSIS — I10 Essential (primary) hypertension: Secondary | ICD-10-CM | POA: Diagnosis not present

## 2016-10-29 DIAGNOSIS — Z853 Personal history of malignant neoplasm of breast: Secondary | ICD-10-CM | POA: Diagnosis not present

## 2016-10-29 DIAGNOSIS — E785 Hyperlipidemia, unspecified: Secondary | ICD-10-CM | POA: Diagnosis not present

## 2016-10-29 DIAGNOSIS — Z85038 Personal history of other malignant neoplasm of large intestine: Secondary | ICD-10-CM | POA: Diagnosis not present

## 2016-10-29 DIAGNOSIS — Z8673 Personal history of transient ischemic attack (TIA), and cerebral infarction without residual deficits: Secondary | ICD-10-CM | POA: Diagnosis not present

## 2016-10-29 DIAGNOSIS — Z96652 Presence of left artificial knee joint: Secondary | ICD-10-CM | POA: Diagnosis not present

## 2016-11-02 DIAGNOSIS — Z96652 Presence of left artificial knee joint: Secondary | ICD-10-CM | POA: Diagnosis not present

## 2016-11-02 DIAGNOSIS — Z8673 Personal history of transient ischemic attack (TIA), and cerebral infarction without residual deficits: Secondary | ICD-10-CM | POA: Diagnosis not present

## 2016-11-02 DIAGNOSIS — Z85038 Personal history of other malignant neoplasm of large intestine: Secondary | ICD-10-CM | POA: Diagnosis not present

## 2016-11-02 DIAGNOSIS — Z471 Aftercare following joint replacement surgery: Secondary | ICD-10-CM | POA: Diagnosis not present

## 2016-11-02 DIAGNOSIS — E785 Hyperlipidemia, unspecified: Secondary | ICD-10-CM | POA: Diagnosis not present

## 2016-11-02 DIAGNOSIS — Z853 Personal history of malignant neoplasm of breast: Secondary | ICD-10-CM | POA: Diagnosis not present

## 2016-11-02 DIAGNOSIS — I10 Essential (primary) hypertension: Secondary | ICD-10-CM | POA: Diagnosis not present

## 2016-11-03 NOTE — Anesthesia Postprocedure Evaluation (Signed)
Anesthesia Post Note  Patient: Lindsay Russo  Procedure(s) Performed: Procedure(s) (LRB): COMPUTER ASSISTED TOTAL KNEE ARTHROPLASTY (Left)  Patient location during evaluation: PACU Anesthesia Type: Spinal Level of consciousness: oriented and awake and alert Pain management: pain level controlled Vital Signs Assessment: post-procedure vital signs reviewed and stable Respiratory status: spontaneous breathing, respiratory function stable and patient connected to nasal cannula oxygen Cardiovascular status: blood pressure returned to baseline and stable Postop Assessment: no headache and no backache Anesthetic complications: no    Last Vitals:  Vitals:   10/28/16 0819 10/28/16 0947  BP: (!) 164/83   Pulse: 94 99  Resp: 18   Temp: 36.6 C     Last Pain:  Vitals:   10/28/16 0932  TempSrc:   PainSc: [place]

## 2016-11-04 ENCOUNTER — Ambulatory Visit: Payer: Medicare Other | Admitting: Physician Assistant

## 2016-11-04 DIAGNOSIS — Z96652 Presence of left artificial knee joint: Secondary | ICD-10-CM | POA: Diagnosis not present

## 2016-11-04 DIAGNOSIS — I10 Essential (primary) hypertension: Secondary | ICD-10-CM | POA: Diagnosis not present

## 2016-11-04 DIAGNOSIS — Z471 Aftercare following joint replacement surgery: Secondary | ICD-10-CM | POA: Diagnosis not present

## 2016-11-04 DIAGNOSIS — E785 Hyperlipidemia, unspecified: Secondary | ICD-10-CM | POA: Diagnosis not present

## 2016-11-04 DIAGNOSIS — Z8673 Personal history of transient ischemic attack (TIA), and cerebral infarction without residual deficits: Secondary | ICD-10-CM | POA: Diagnosis not present

## 2016-11-04 DIAGNOSIS — Z853 Personal history of malignant neoplasm of breast: Secondary | ICD-10-CM | POA: Diagnosis not present

## 2016-11-04 DIAGNOSIS — Z85038 Personal history of other malignant neoplasm of large intestine: Secondary | ICD-10-CM | POA: Diagnosis not present

## 2016-11-06 DIAGNOSIS — Z85038 Personal history of other malignant neoplasm of large intestine: Secondary | ICD-10-CM | POA: Diagnosis not present

## 2016-11-06 DIAGNOSIS — Z853 Personal history of malignant neoplasm of breast: Secondary | ICD-10-CM | POA: Diagnosis not present

## 2016-11-06 DIAGNOSIS — E785 Hyperlipidemia, unspecified: Secondary | ICD-10-CM | POA: Diagnosis not present

## 2016-11-06 DIAGNOSIS — Z471 Aftercare following joint replacement surgery: Secondary | ICD-10-CM | POA: Diagnosis not present

## 2016-11-06 DIAGNOSIS — I10 Essential (primary) hypertension: Secondary | ICD-10-CM | POA: Diagnosis not present

## 2016-11-06 DIAGNOSIS — Z8673 Personal history of transient ischemic attack (TIA), and cerebral infarction without residual deficits: Secondary | ICD-10-CM | POA: Diagnosis not present

## 2016-11-06 DIAGNOSIS — Z96652 Presence of left artificial knee joint: Secondary | ICD-10-CM | POA: Diagnosis not present

## 2016-11-10 DIAGNOSIS — I10 Essential (primary) hypertension: Secondary | ICD-10-CM | POA: Diagnosis not present

## 2016-11-10 DIAGNOSIS — Z96652 Presence of left artificial knee joint: Secondary | ICD-10-CM | POA: Diagnosis not present

## 2016-11-10 DIAGNOSIS — Z85038 Personal history of other malignant neoplasm of large intestine: Secondary | ICD-10-CM | POA: Diagnosis not present

## 2016-11-10 DIAGNOSIS — E785 Hyperlipidemia, unspecified: Secondary | ICD-10-CM | POA: Diagnosis not present

## 2016-11-10 DIAGNOSIS — Z853 Personal history of malignant neoplasm of breast: Secondary | ICD-10-CM | POA: Diagnosis not present

## 2016-11-10 DIAGNOSIS — Z8673 Personal history of transient ischemic attack (TIA), and cerebral infarction without residual deficits: Secondary | ICD-10-CM | POA: Diagnosis not present

## 2016-11-10 DIAGNOSIS — Z471 Aftercare following joint replacement surgery: Secondary | ICD-10-CM | POA: Diagnosis not present

## 2016-11-25 ENCOUNTER — Encounter: Payer: Self-pay | Admitting: Family Medicine

## 2016-11-25 ENCOUNTER — Ambulatory Visit (INDEPENDENT_AMBULATORY_CARE_PROVIDER_SITE_OTHER): Payer: Medicare Other | Admitting: Family Medicine

## 2016-11-25 VITALS — BP 150/90 | HR 100 | Temp 99.5°F | Resp 18 | Wt 219.0 lb

## 2016-11-25 DIAGNOSIS — J321 Chronic frontal sinusitis: Secondary | ICD-10-CM

## 2016-11-25 MED ORDER — AZITHROMYCIN 250 MG PO TABS: ORAL_TABLET | ORAL | 0 refills | Status: AC

## 2016-11-25 NOTE — Progress Notes (Signed)
Patient: Lindsay Russo Female    DOB: [birth year]   70 y.o.   MRN: [id number] Visit Date: 11/25/2016  Today's Provider: Lelon Huh, MD   Chief Complaint  Patient presents with  . Cough   Subjective:    Cough  This is a new problem. Episode onset: 2 days ago. The problem has been unchanged. The cough is productive of sputum (white colored ). Associated symptoms include headaches (pounding/ pressure), nasal congestion, postnasal drip, rhinorrhea and a sore throat (on left side). Pertinent negatives include no chest pain, chills, ear congestion, ear pain, fever, hemoptysis, myalgias, shortness of breath, sweats or wheezing. Treatments tried: Robitussin cough medication, DayQuil, NyQuil, Tylenol. The treatment provided no relief.       Allergies  Allergen Reactions  . Tegaderm Ag Mesh [Silver] Other (See Comments)    blisters  . Amoxicillin Hives and Rash    Has patient had a PCN reaction causing immediate rash, facial/tongue/throat swelling, SOB or lightheadedness with hypotension: Yes Has patient had a PCN reaction causing severe rash involving mucus membranes or skin necrosis: Yes Has patient had a PCN reaction that required hospitalization No Has patient had a PCN reaction occurring within the last 10 years: Yes If all of the above answers are "NO", then may proceed with Cephalosporin use.   . Sulfa Antibiotics Rash     Current Outpatient Prescriptions:  .  acetaminophen (TYLENOL) 500 MG tablet, Take 1,000 mg by mouth every 6 (six) hours as needed for moderate pain or headache., Disp: , Rfl:  .  amLODipine (NORVASC) 10 MG tablet, TAKE ONE (1) TABLET BY MOUTH EVERY DAY (Patient taking differently: TAKE ONE (1) TABLET BY MOUTH EVERY DAY at bedtime), Disp: 90 tablet, Rfl: 4 .  chlorthalidone (HYGROTON) 25 MG tablet, TAKE ONE (1) TABLET BY MOUTH EVERY DAY, Disp: 30 tablet, Rfl: 12 .  diclofenac sodium (VOLTAREN) 1 % GEL, Apply 1 application topically 2 (two) times daily.  , Disp: , Rfl:  .  diphenhydrAMINE (BENADRYL) 25 MG tablet, Take 25 mg by mouth daily as needed for allergies., Disp: , Rfl:  .  enoxaparin (LOVENOX) 40 MG/0.4ML injection, Inject 0.4 mLs (40 mg total) into the skin daily., Disp: 14 Syringe, Rfl: 0 .  lovastatin (MEVACOR) 40 MG tablet, TAKE ONE TABLET AT BEDTIME, Disp: 90 tablet, Rfl: 4 .  meclizine (ANTIVERT) 25 MG tablet, Take 1 tablet (25 mg total) by mouth 3 (three) times daily as needed for dizziness., Disp: 30 tablet, Rfl: 0 .  Multiple Vitamins-Minerals (ALIVE ONCE DAILY WOMENS) TABS, Take 1 tablet by mouth daily., Disp: , Rfl:  .  oxyCODONE (OXY IR/ROXICODONE) 5 MG immediate release tablet, Take 1-2 tablets (5-10 mg total) by mouth every 4 (four) hours as needed for severe pain or breakthrough pain., Disp: 30 tablet, Rfl: 0 .  promethazine (PHENERGAN) 25 MG tablet, Take 1 tablet (25 mg total) by mouth every 8 (eight) hours as needed for nausea or vomiting., Disp: 20 tablet, Rfl: 0 .  tamoxifen (NOLVADEX) 20 MG tablet, TAKE ONE (1) TABLET BY MOUTH EVERY DAY, Disp: 90 tablet, Rfl: 3 .  traMADol (ULTRAM) 50 MG tablet, Take 1-2 tablets (50-100 mg total) by mouth every 4 (four) hours as needed for moderate pain., Disp: 60 tablet, Rfl: 0  Review of Systems  Constitutional: Positive for fatigue. Negative for appetite change, chills, diaphoresis and fever.  HENT: Positive for congestion, postnasal drip, rhinorrhea, sinus pain, sinus pressure, sneezing, sore throat (on left side)  and voice change. Negative for ear pain, mouth sores and nosebleeds.   Eyes: Positive for discharge (watery eyes).  Respiratory: Positive for cough. Negative for hemoptysis, chest tightness, shortness of breath and wheezing.   Cardiovascular: Negative for chest pain and palpitations.  Gastrointestinal: Negative for abdominal pain, nausea and vomiting.  Musculoskeletal: Negative for myalgias.  Neurological: Positive for headaches (pounding/ pressure). Negative for dizziness  and weakness.    Social History  Substance Use Topics  . Smoking status: Never Smoker  . Smokeless tobacco: Never Used  . Alcohol use No   Objective:   BP (!) 150/90 (BP Location: Right Arm, Patient Position: Sitting, Cuff Size: Large)   Pulse 100   Temp 99.5 F (37.5 C) (Oral)   Resp 18   Wt 219 lb (99.3 kg)   SpO2 97% Comment: room air  BMI 35.35 kg/m   Physical Exam  General Appearance:    Alert, cooperative, no distress  HENT:   bilateral TM normal without fluid or infection, neck without nodes, throat normal without erythema or exudate, frontal sinuses tender, post nasal drip noted and nasal mucosa pale and congested  Eyes:    PERRL, conjunctiva/corneas clear, EOM's intact       Lungs:     Clear to auscultation bilaterally, respirations unlabored  Heart:    Regular rate and rhythm  Neurologic:   Awake, alert, oriented x 3. No apparent focal neurological           defect.           Assessment & Plan:     1. Frontal sinusitis, unspecified chronicity  - azithromycin (ZITHROMAX) 250 MG tablet; 2 by mouth today, then 1 daily for 4 days  Dispense: 6 tablet; Refill: 0 Patient Instructions   You can take a decongestant nasal spray (e.g. Afrin) at night only for 3-4 night to relieve nasal congestion and sinus pressure  Call if symptoms change or if not rapidly improving.          Lelon Huh, MD  [facility]

## 2016-11-25 NOTE — Patient Instructions (Signed)
   You can take a decongestant nasal spray (e.g. Afrin) at night only for 3-4 night to relieve nasal congestion and sinus pressure

## 2016-12-07 ENCOUNTER — Ambulatory Visit: Payer: Medicare Other | Admitting: Family Medicine

## 2016-12-08 DIAGNOSIS — Z96652 Presence of left artificial knee joint: Secondary | ICD-10-CM | POA: Diagnosis not present

## 2017-01-04 ENCOUNTER — Ambulatory Visit: Payer: Medicare Other | Admitting: Family Medicine

## 2017-01-25 ENCOUNTER — Ambulatory Visit (INDEPENDENT_AMBULATORY_CARE_PROVIDER_SITE_OTHER): Payer: Medicare Other | Admitting: Family Medicine

## 2017-01-25 ENCOUNTER — Other Ambulatory Visit: Payer: Self-pay | Admitting: Family Medicine

## 2017-01-25 ENCOUNTER — Encounter: Payer: Self-pay | Admitting: Family Medicine

## 2017-01-25 VITALS — BP 150/88 | HR 76 | Temp 98.1°F | Resp 16 | Ht 65.75 in | Wt 218.0 lb

## 2017-01-25 DIAGNOSIS — Z0000 Encounter for general adult medical examination without abnormal findings: Secondary | ICD-10-CM

## 2017-01-25 DIAGNOSIS — R601 Generalized edema: Secondary | ICD-10-CM | POA: Diagnosis not present

## 2017-01-25 DIAGNOSIS — R7303 Prediabetes: Secondary | ICD-10-CM

## 2017-01-25 DIAGNOSIS — Z1159 Encounter for screening for other viral diseases: Secondary | ICD-10-CM | POA: Diagnosis not present

## 2017-01-25 DIAGNOSIS — E782 Mixed hyperlipidemia: Secondary | ICD-10-CM | POA: Diagnosis not present

## 2017-01-25 DIAGNOSIS — I10 Essential (primary) hypertension: Secondary | ICD-10-CM | POA: Diagnosis not present

## 2017-01-25 MED ORDER — CHLORTHALIDONE 25 MG PO TABS: 25.0000 mg | ORAL_TABLET | Freq: Every day | ORAL | 2 refills | Status: DC

## 2017-01-25 MED ORDER — AMLODIPINE BESYLATE 10 MG PO TABS: 10.0000 mg | ORAL_TABLET | Freq: Every day | ORAL | 4 refills | Status: DC

## 2017-01-25 NOTE — Patient Instructions (Addendum)
Try taking Coenzyme Q10 200mg  daily, or daily b Complex vitamin.

## 2017-01-25 NOTE — Progress Notes (Signed)
Patient: Lindsay Russo, Female    DOB: 04-02-1946, 71 y.o.   MRN: [id number] Visit Date: 01/25/2017  Today's Provider: Lelon Huh, MD   Chief Complaint  Patient presents with  . Annual Exam  . Hypertension    follow up  . Hyperlipidemia    follow up  . Hyperglycemia    follow up  . Anemia    follow up   Subjective:    Annual wellness visit Lindsay Russo is a 71 y.o. female. She feels well. She reports exercising regularly. She reports she is sleeping poorly.  -----------------------------------------------------------  Hypertension, follow-up:  BP Readings from Last 3 Encounters:  11/25/16 (!) 150/90  10/28/16 (!) 164/83  10/15/16 122/75    She was last seen for hypertension 6 months ago.  BP at that visit was 150/68. Management since that visit includes no changes. She had been on chlorthalidone around the time of her knee surgery for better blood pressure control, but has since stopped taking It. She states she did not have any adverse reaction to medications.  She reports good compliance with treatment. She is not having side effects.  She is exercising. She is adherent to low salt diet.   Outside blood pressures are [id number] (systolic) over Q000111Q (diastolic). She is experiencing lower extremity edema.  Patient denies chest pain, chest pressure/discomfort, claudication, dyspnea, exertional chest pressure/discomfort, fatigue, irregular heart beat, near-syncope, orthopnea, palpitations, paroxysmal nocturnal dyspnea, syncope and tachypnea.   Cardiovascular risk factors include advanced age (older than 64 for men, 85 for women), dyslipidemia and hypertension.  Use of agents associated with hypertension: none.     Weight trend: stable Wt Readings from Last 3 Encounters:  11/25/16 219 lb (99.3 kg)  10/26/16 230 lb (104.3 kg)  10/15/16 230 lb (104.3 kg)    Current diet: in general, a "healthy" diet     ------------------------------------------------------------------------  Lipid/Cholesterol, Follow-up:   Last seen for this more than 1 year ago.  Management changes since that visit include none. . Last Lipid Panel:    Component Value Date/Time   CHOL 145 11/18/2015 1030   TRIG 192 (H) 11/18/2015 1030   HDL 39 (L) 11/18/2015 1030   CHOLHDL 3.7 11/18/2015 1030   [place] 68 11/18/2015 1030    Risk factors for vascular disease include hypercholesterolemia and hypertension  She reports good compliance with treatment. She is not having side effects.  Current symptoms include none and have been stable. Weight trend: stable Prior visit with dietician: no Current diet: in general, a "healthy" diet   Current exercise: walking  Wt Readings from Last 3 Encounters:  11/25/16 219 lb (99.3 kg)  10/26/16 230 lb (104.3 kg)  10/15/16 230 lb (104.3 kg)    -------------------------------------------------------------------  Prediabetes, Follow-up:   Lab Results  Component Value Date   HGBA1C 6.1 (H) 10/15/2016   HGBA1C 6.1 (H) 07/01/2016   HGBA1C 6.3 (H) 11/18/2015   GLUCOSE 159 (H) 10/28/2016   GLUCOSE 159 (H) 10/27/2016   GLUCOSE 118 (H) 10/15/2016    Last seen for for this6 months ago.  Management since that visit includes no changes. Current symptoms include none and have been stable.  Weight trend: stable Prior visit with dietician: no Current diet: in general, a "healthy" diet   Current exercise: walking  Pertinent Labs:    Component Value Date/Time   CHOL 145 11/18/2015 1030   TRIG 192 (H) 11/18/2015 1030   CHOLHDL 3.7 11/18/2015 1030   CREATININE 1.24 (H)  10/28/2016 0543   CREATININE 1.12 05/04/2013 2326    Wt Readings from Last 3 Encounters:  11/25/16 219 lb (99.3 kg)  10/26/16 230 lb (104.3 kg)  10/15/16 230 lb (104.3 kg)   Follow up Anemia:  Patient was last seen for this problem 6 months ago and no changes were made.    Follow up of Estrogen  Deficiency:  Patient was last seen for this problem 2 years ago.  Management during that visit includes ordering BMD which was normal; patient is to repeat in 5 years.   Review of Systems  Constitutional: Positive for diaphoresis. Negative for chills, fatigue and fever.  HENT: Negative for congestion, ear pain, rhinorrhea, sneezing and sore throat.   Eyes: Negative.  Negative for pain and redness.  Respiratory: Negative for cough, shortness of breath and wheezing.   Cardiovascular: Positive for leg swelling. Negative for chest pain.  Gastrointestinal: Negative for abdominal pain, blood in stool, constipation, diarrhea and nausea.  Endocrine: Negative for polydipsia and polyphagia.  Genitourinary: Positive for frequency. Negative for dysuria, flank pain, hematuria, pelvic pain, vaginal bleeding and vaginal discharge.  Musculoskeletal: Positive for joint swelling. Negative for arthralgias, back pain and gait problem.  Skin: Negative for rash.  Neurological: Negative.  Negative for dizziness, tremors, seizures, weakness, light-headedness, numbness and headaches.  Hematological: Negative for adenopathy. Bruises/bleeds easily.  Psychiatric/Behavioral: Negative.  Negative for behavioral problems, confusion and dysphoric mood. The patient is not nervous/anxious and is not hyperactive.     Social History   Social History  . Marital status: Married    Spouse name: N/A  . Number of children: 2  . Years of education: N/A   Occupational History  . Retired    Social History Main Topics  . Smoking status: Never Smoker  . Smokeless tobacco: Never Used  . Alcohol use No  . Drug use: No  . Sexual activity: Not on file   Other Topics Concern  . Not on file   Social History Narrative  . No narrative on file    Past Medical History:  Diagnosis Date  . Arthritis   . History of kidney stones   . Hypertension   . LCIS September 05, 2013   Left breast, ER 90%, PR 60%  . Malignant neoplasm of  ascending colon [facility]) November 2007   T2, N0.5.2 cm low-grade adenocarcinoma. 0/28 nodes positive.  . Personal history of colonic polyps   . Spinal headache 1995   at [facility] spinal made me feel dead, had no control.  . Stroke ([place]) 09/17/2010   Right thalamic without residual effects     Patient Active Problem List   Diagnosis Date Noted  . S/P total knee arthroplasty 10/26/2016  . Allergic rhinitis 11/21/2015  . Personal history of urinary calculi 11/21/2015  . History of migraine 11/21/2015  . CN (constipation) 11/21/2015  . Arthritis 11/21/2015  . Vertigo 05/13/2015  . Senile purpura ([place]) 05/13/2015  . Cerebrovascular disease 03/26/2015  . Degenerative disc disease, lumbar 03/26/2015  . Obesity 03/26/2015  . Hyperlipidemia, mixed 03/26/2015  . Hypertension 03/26/2015  . Pre-diabetes 03/26/2015  . History of left breast cancer 08/21/2014  . Lobular carcinoma in situ 09/26/2013  . Breast neoplasm, Tis (LCIS) 09/12/2013  . Breast microcalcification, mammographic 07/26/2013  . History of CVA (cerebrovascular accident) 09/17/2010  . Malignant neoplasm of ascending colon ([place]) 09/30/2006  . History of colon cancer 09/30/2006  . DDD (degenerative disc disease), lumbar 11/30/1998    Past Surgical History:  Procedure  Laterality Date  . APPENDECTOMY  1995  . BREAST BIOPSY Right August 07, 2010   Sclerotic fibroadenoma removed from the right breast at the 10:00 position  . BREAST SURGERY Left 09/05/13   LCIS, ER 90%, PR 60%, negative margins.  . COLONOSCOPY  August 03, 2012   8 mm tubulovillous adenoma removed from the transverse colon. Followup exam in 2018 plan.  . DOPPLER ECHOCARDIOGRAPHY  09/17/2010   EF 55%, Mild to moderate MR and mild to moderate TR  . HEEL SPUR EXCISION Left 2005  . JOINT REPLACEMENT Right 2012  . KNEE ARTHROPLASTY Left 10/26/2016   Procedure: COMPUTER ASSISTED TOTAL KNEE ARTHROPLASTY;  Surgeon: Dereck Leep, MD;  Location: [facility] ORS;  Service:  Orthopedics;  Laterality: Left;  . LUMBAR DISC SURGERY  2009   L4-5 microdiscectomy Dr. Mauri Pole  . OVARIAN CYST REMOVAL  1980  . PARTIAL COLECTOMY Right 2007   Dr. Bary Castilla  . REPLACEMENT TOTAL KNEE Right 12/2010    Her family history includes Brain cancer in her father; Epilepsy in her other; Gastric cancer in her father; Heart disease in her other; Hypertension in her sister; Ulcers in her other.      Current Outpatient Prescriptions:  .  acetaminophen (TYLENOL) 500 MG tablet, Take 1,000 mg by mouth every 6 (six) hours as needed for moderate pain or headache., Disp: , Rfl:  .  amLODipine (NORVASC) 10 MG tablet, TAKE ONE (1) TABLET BY MOUTH EVERY DAY (Patient taking differently: TAKE ONE (1) TABLET BY MOUTH EVERY DAY at bedtime), Disp: 90 tablet, Rfl: 4 .  chlorthalidone (HYGROTON) 25 MG tablet, TAKE ONE (1) TABLET BY MOUTH EVERY DAY, Disp: 30 tablet, Rfl: 12 .  diclofenac sodium (VOLTAREN) 1 % GEL, Apply 1 application topically 2 (two) times daily. , Disp: , Rfl:  .  diphenhydrAMINE (BENADRYL) 25 MG tablet, Take 25 mg by mouth daily as needed for allergies., Disp: , Rfl:  .  enoxaparin (LOVENOX) 40 MG/0.4ML injection, Inject 0.4 mLs (40 mg total) into the skin daily., Disp: 14 Syringe, Rfl: 0 .  lovastatin (MEVACOR) 40 MG tablet, TAKE ONE TABLET AT BEDTIME, Disp: 90 tablet, Rfl: 4 .  meclizine (ANTIVERT) 25 MG tablet, Take 1 tablet (25 mg total) by mouth 3 (three) times daily as needed for dizziness., Disp: 30 tablet, Rfl: 0 .  Multiple Vitamins-Minerals (ALIVE ONCE DAILY WOMENS) TABS, Take 1 tablet by mouth daily., Disp: , Rfl:  .  oxyCODONE (OXY IR/ROXICODONE) 5 MG immediate release tablet, Take 1-2 tablets (5-10 mg total) by mouth every 4 (four) hours as needed for severe pain or breakthrough pain., Disp: 30 tablet, Rfl: 0 .  promethazine (PHENERGAN) 25 MG tablet, Take 1 tablet (25 mg total) by mouth every 8 (eight) hours as needed for nausea or vomiting., Disp: 20 tablet, Rfl: 0 .   tamoxifen (NOLVADEX) 20 MG tablet, TAKE ONE (1) TABLET BY MOUTH EVERY DAY, Disp: 90 tablet, Rfl: 3 .  traMADol (ULTRAM) 50 MG tablet, Take 1-2 tablets (50-100 mg total) by mouth every 4 (four) hours as needed for moderate pain., Disp: 60 tablet, Rfl: 0  Patient Care Team: Birdie Sons, MD as PCP - General (Family Medicine) Robert Bellow, MD (General Surgery) Birdie Sons, MD as Referring Physician (Family Medicine)     Objective:   Vitals: BP (!) 150/88 (BP Location: Left Arm, Patient Position: Sitting)   Pulse 76   Temp 98.1 F (36.7 C) (Oral)   Resp 16   Ht 5' 5.75" (  1.67 m)   Wt 218 lb (98.9 kg)   SpO2 99% Comment: room air  BMI 35.45 kg/m   Physical Exam   General Appearance:    Alert, cooperative, no distress, appears stated age  Head:    Normocephalic, without obvious abnormality, atraumatic  Eyes:    PERRL, conjunctiva/corneas clear, EOM's intact, fundi    benign, both eyes  Ears:    Normal TM's and external ear canals, both ears  Nose:   Nares normal, septum midline, mucosa normal, no drainage    or sinus tenderness  Throat:   Lips, mucosa, and tongue normal; teeth and gums normal  Neck:   Supple, symmetrical, trachea midline, no adenopathy;    thyroid:  no enlargement/tenderness/nodules; no carotid   bruit or JVD  Back:     Symmetric, no curvature, ROM normal, no CVA tenderness  Lungs:     Clear to auscultation bilaterally, respirations unlabored  Chest Wall:    No tenderness or deformity   Heart:    Regular rate and rhythm, S1 and S2 normal, no murmur, rub   or gallop  Breast Exam:    deferred  Abdomen:     Soft, non-tender, bowel sounds active all four quadrants,    no masses, no organomegaly  Pelvic:    deferred  Extremities:   Extremities normal, atraumatic, no cyanosis or edema  Pulses:   2+ and symmetric all extremities  Skin:   Skin color, texture, turgor normal, no rashes or lesions  Lymph nodes:   Cervical, supraclavicular, and axillary  nodes normal  Neurologic:   CNII-XII intact, normal strength, sensation and reflexes    throughout    Activities of Daily Living In your present state of health, do you have any difficulty performing the following activities: 01/25/2017 10/26/2016  Hearing? N N  Vision? N N  Difficulty concentrating or making decisions? N N  Walking or climbing stairs? N Y  Dressing or bathing? N N  Doing errands, shopping? N N  Some recent data might be hidden    Fall Risk Assessment Fall Risk  01/25/2017 01/25/2017 11/25/2016 11/18/2015  Falls in the past year? No No No No     Depression Screen PHQ 2/9 Scores 01/25/2017 01/25/2017 11/25/2016 11/18/2015  PHQ - 2 Score 0 0 0 0    Cognitive Testing - 6-CIT  Correct? Score   What year is it? yes 0 0 or 4  What month is it? yes 0 0 or 3  Memorize:    Pia Mau,  42,  [address]  [place],      What time is it? (within 1 hour) yes 0 0 or 3  Count backwards from 20 yes 0 0, 2, or 4  Name the months of the year yes 0 0, 2, or 4  Repeat name & address above yes 0 0, 2, 4, 6, 8, or 10       TOTAL SCORE  0/28   Interpretation:  Normal  Normal (0-7) Abnormal (8-28)    Audit-C Alcohol Use Screening  Question Answer Points  How often do you have alcoholic drink? never 0  On days you do drink alcohol, how many drinks do you typically consume? n/a 0  How oftey will you drink 6 or more in a total? never 0  Total Score:  0   A score of 3 or more in women, and 4 or more in men indicates increased risk for alcohol abuse, EXCEPT if all of the  points are from question 1.  Current Exercise Habits: Home exercise routine, Type of exercise: walking, Time (Minutes): 45, Frequency (Times/Week): 3, Weekly Exercise (Minutes/Week): 135, Intensity: Moderate Exercise limited by: None identified   Assessment & Plan:    Annual physical Reviewed patient's Family Medical History Reviewed and updated list of patient's medical providers Assessment of cognitive  impairment was done Assessed patient's functional ability Established a written schedule for health screening [place] Completed and Reviewed  Exercise Activities and Dietary recommendations Goals    None      Immunization History  Administered Date(s) Administered  . Pneumococcal Conjugate-13 11/12/2014  . Pneumococcal Polysaccharide-23 01/11/2012  . Tdap 04/06/2007    Health Maintenance  Topic Date Due  . Hepatitis C Screening  1946/09/26  . INFLUENZA VACCINE  06/30/2016  . TETANUS/TDAP  04/05/2017  . MAMMOGRAM  07/31/2018  . COLONOSCOPY  08/03/2022  . DEXA SCAN  Completed  . PNA vac Low Risk Adult  Completed     Discussed health benefits of physical activity, and encouraged her to engage in regular exercise appropriate for her age and condition.    ------------------------------------------------------------------------------------------------------------  1. Annual physical exam Breast exam, mammogram & colonoscopy per Dr. Bary Castilla  2. Essential hypertension Start back on chorthalidone which she had tolerated well when taking last year. Recheck BP 3 months.  - Comprehensive metabolic panel - chlorthalidone (HYGROTON) 25 MG tablet; Take 1 tablet (25 mg total) by mouth daily.  Dispense: 90 tablet; Refill: 2 - amLODipine (NORVASC) 10 MG tablet; Take 1 tablet (10 mg total) by mouth daily.  Dispense: 90 tablet; Refill: 4  3. Generalized edema  - chlorthalidone (HYGROTON) 25 MG tablet; Take 1 tablet (25 mg total) by mouth daily.  Dispense: 90 tablet; Refill: 2  4. Pre-diabetes  - Hemoglobin A1c  5. Hyperlipidemia, mixed She is tolerating lovastatin well with no adverse effects.  Try OTC Coq10 for occasional cramping in arms and legs.  - Lipid panel  6. Need for hepatitis C screening test  - Hepatitis C antibody   Lelon Huh, MD  [facility]

## 2017-01-26 ENCOUNTER — Telehealth: Payer: Self-pay

## 2017-01-26 LAB — COMPREHENSIVE METABOLIC PANEL
ALK PHOS: 95 IU/L (ref 39–117)
ALT: 13 IU/L (ref 0–32)
AST: 21 IU/L (ref 0–40)
Albumin/Globulin Ratio: 1.4 (ref 1.2–2.2)
Albumin: 4.3 g/dL (ref 3.5–4.8)
BILIRUBIN TOTAL: 0.4 mg/dL (ref 0.0–1.2)
BUN/Creatinine Ratio: 14 (ref 12–28)
BUN: 13 mg/dL (ref 8–27)
CHLORIDE: 103 mmol/L (ref 96–106)
CO2: 26 mmol/L (ref 18–29)
Calcium: 9.6 mg/dL (ref 8.7–10.3)
Creatinine, Ser: 0.9 mg/dL (ref 0.57–1.00)
GFR calc Af Amer: 74 mL/min/{1.73_m2} (ref >59)
GFR calc non Af Amer: 65 mL/min/{1.73_m2} (ref >59)
GLUCOSE: 117 mg/dL — AB (ref 65–99)
Globulin, Total: 3 g/dL (ref 1.5–4.5)
Potassium: 4.4 mmol/L (ref 3.5–5.2)
Sodium: 145 mmol/L — ABNORMAL HIGH (ref 134–144)
Total Protein: 7.3 g/dL (ref 6.0–8.5)

## 2017-01-26 LAB — LIPID PANEL
CHOLESTEROL TOTAL: 180 mg/dL (ref 100–199)
Chol/HDL Ratio: 4.6 ratio units — ABNORMAL HIGH (ref 0.0–4.4)
HDL: 39 mg/dL — AB (ref >39)
LDL Calculated: 101 mg/dL — ABNORMAL HIGH (ref 0–99)
Triglycerides: 200 mg/dL — ABNORMAL HIGH (ref 0–149)
VLDL CHOLESTEROL CAL: 40 mg/dL (ref 5–40)

## 2017-01-26 LAB — HEPATITIS C ANTIBODY: Hep C Virus Ab: <0.1 s/co ratio (ref 0.0–0.9)

## 2017-01-26 LAB — HEMOGLOBIN A1C
ESTIMATED AVERAGE GLUCOSE: 114 mg/dL
Hgb A1c MFr Bld: 5.6 % (ref 4.8–5.6)

## 2017-01-26 NOTE — Telephone Encounter (Signed)
Patient advised and verbally voiced understanding.  

## 2017-01-26 NOTE — Telephone Encounter (Signed)
Left message to call back  

## 2017-01-26 NOTE — Telephone Encounter (Signed)
-----   Message from Birdie Sons, MD sent at 01/26/2017  7:56 AM EST ----- Blood sugar, kidney functions, electrolytes and cholesterol are all normal. Continue current medications.   Check labs yearly.

## 2017-01-26 NOTE — Telephone Encounter (Signed)
Pt returned your call. ° °ThanksTeri °

## 2017-02-15 DIAGNOSIS — Z471 Aftercare following joint replacement surgery: Secondary | ICD-10-CM | POA: Diagnosis not present

## 2017-05-03 ENCOUNTER — Encounter: Payer: Self-pay | Admitting: Family Medicine

## 2017-05-03 ENCOUNTER — Ambulatory Visit (INDEPENDENT_AMBULATORY_CARE_PROVIDER_SITE_OTHER): Payer: Medicare Other | Admitting: Family Medicine

## 2017-05-03 VITALS — BP 142/80 | HR 69 | Temp 98.1°F | Resp 16 | Wt 222.0 lb

## 2017-05-03 DIAGNOSIS — I10 Essential (primary) hypertension: Secondary | ICD-10-CM

## 2017-05-03 DIAGNOSIS — R601 Generalized edema: Secondary | ICD-10-CM | POA: Diagnosis not present

## 2017-05-03 MED ORDER — CHLORTHALIDONE 25 MG PO TABS: 25.0000 mg | ORAL_TABLET | Freq: Every day | ORAL | 3 refills | Status: DC

## 2017-05-03 NOTE — Progress Notes (Signed)
Patient: Lindsay Russo Female    DOB: 11-Oct-1946   71 y.o.   MRN: [id number] Visit Date: 05/03/2017  Today's Provider: Lelon Huh, MD   Chief Complaint  Patient presents with  . Hypertension    follow up   Subjective:    HPI  Hypertension, follow-up:  BP Readings from Last 3 Encounters:  01/25/17 (!) 150/88  11/25/16 (!) 150/90  10/28/16 (!) 164/83    She was last seen for hypertension 3 months ago.  BP at that visit was 150/88 Management since that visit includes restarted Chlorthalidone. She reports good compliance with treatment. She is not having side effects.  She is exercising. She is adherent to low salt diet.   Outside blood pressures are 638 systolic. She is experiencing lower extremity edema.  Patient denies chest pain, chest pressure/discomfort, claudication, dyspnea, exertional chest pressure/discomfort, fatigue, irregular heart beat, near-syncope, orthopnea, palpitations, paroxysmal nocturnal dyspnea, syncope and tachypnea.   Cardiovascular risk factors include advanced age (older than 57 for men, 63 for women) and hypertension.  Use of agents associated with hypertension: none.     Weight trend: fluctuating a bit Wt Readings from Last 3 Encounters:  01/25/17 218 lb (98.9 kg)  11/25/16 219 lb (99.3 kg)  10/26/16 230 lb (104.3 kg)    Current diet: in general, a "healthy" diet    ------------------------------------------------------------------------     Allergies  Allergen Reactions  . Tegaderm Ag Mesh [Silver] Other (See Comments)    blisters  . Amoxicillin Hives and Rash    Has patient had a PCN reaction causing immediate rash, facial/tongue/throat swelling, SOB or lightheadedness with hypotension: Yes Has patient had a PCN reaction causing severe rash involving mucus membranes or skin necrosis: Yes Has patient had a PCN reaction that required hospitalization No Has patient had a PCN reaction occurring within the last 10 years:  Yes If all of the above answers are "NO", then may proceed with Cephalosporin use.   . Sulfa Antibiotics Rash     Current Outpatient Prescriptions:  .  acetaminophen (TYLENOL) 500 MG tablet, Take 1,000 mg by mouth every 6 (six) hours as needed for moderate pain or headache., Disp: , Rfl:  .  amLODipine (NORVASC) 10 MG tablet, Take 1 tablet (10 mg total) by mouth daily., Disp: 90 tablet, Rfl: 4 .  chlorthalidone (HYGROTON) 25 MG tablet, Take 1 tablet (25 mg total) by mouth daily., Disp: 90 tablet, Rfl: 2 .  Coenzyme Q10 (COQ10) 200 MG CAPS, Take 1 capsule by mouth daily., Disp: , Rfl:  .  lovastatin (MEVACOR) 40 MG tablet, TAKE ONE TABLET BY MOUTH EVERY NIGHT AT BEDTIME, Disp: 90 tablet, Rfl: 4 .  meclizine (ANTIVERT) 25 MG tablet, Take 1 tablet (25 mg total) by mouth 3 (three) times daily as needed for dizziness., Disp: 30 tablet, Rfl: 0 .  promethazine (PHENERGAN) 25 MG tablet, Take 1 tablet (25 mg total) by mouth every 8 (eight) hours as needed for nausea or vomiting., Disp: 20 tablet, Rfl: 0 .  tamoxifen (NOLVADEX) 20 MG tablet, TAKE ONE (1) TABLET BY MOUTH EVERY DAY, Disp: 90 tablet, Rfl: 3  Review of Systems  Constitutional: Negative for appetite change, chills, fatigue and fever.  Respiratory: Negative for chest tightness and shortness of breath.   Cardiovascular: Positive for leg swelling. Negative for chest pain and palpitations.  Gastrointestinal: Negative for abdominal pain, nausea and vomiting.  Neurological: Negative for dizziness and weakness.    Social History  Substance Use  Topics  . Smoking status: Never Smoker  . Smokeless tobacco: Never Used  . Alcohol use No   Objective:   BP (!) 144/72 (BP Location: Right Arm, Patient Position: Sitting, Cuff Size: Large)   Pulse 69   Temp 98.1 F (36.7 C) (Oral)   Resp 16   Wt 222 lb (100.7 kg)   SpO2 98% Comment: room air  BMI 36.10 kg/m  Vitals:   05/03/17 0936  BP: (!) 144/72  Pulse: 69  Resp: 16  Temp: 98.1 F  (36.7 C)  TempSrc: Oral  SpO2: 98%  Weight: 222 lb (100.7 kg)                           Physical Exam  General Appearance:    Alert, cooperative, no distress, obese  Eyes:    PERRL, conjunctiva/corneas clear, EOM's intact       Lungs:     Clear to auscultation bilaterally, respirations unlabored  Heart:    Regular rate and rhythm  Neurologic:   Awake, alert, oriented x 3. No apparent focal neurological           defect.           Assessment & Plan:     1. Essential hypertension Improved, but not quit to goal. Continue current meidcations for now.  - chlorthalidone (HYGROTON) 25 MG tablet; Take 1 tablet (25 mg total) by mouth daily.  Dispense: 90 tablet; Refill: 3  2. Generalized edema  - chlorthalidone (HYGROTON) 25 MG tablet; Take 1 tablet (25 mg total) by mouth daily.  Dispense: 90 tablet; Refill: 3  Return in about 5 months (around 10/03/2017).       Lelon Huh, MD  [facility]

## 2017-07-14 ENCOUNTER — Other Ambulatory Visit: Payer: Self-pay

## 2017-07-14 DIAGNOSIS — D0502 Lobular carcinoma in situ of left breast: Secondary | ICD-10-CM

## 2017-07-28 ENCOUNTER — Inpatient Hospital Stay
Admission: RE | Admit: 2017-07-28 | Discharge: 2017-07-28 | Disposition: A | Discharge status: Home / Self Care | Payer: Self-pay | Source: Ambulatory Visit | Attending: *Deleted | Admitting: *Deleted

## 2017-07-28 ENCOUNTER — Other Ambulatory Visit: Payer: Self-pay | Admitting: *Deleted

## 2017-07-28 DIAGNOSIS — Z9289 Personal history of other medical treatment: Secondary | ICD-10-CM

## 2017-08-05 ENCOUNTER — Ambulatory Visit
Admission: RE | Admit: 2017-08-05 | Discharge: 2017-08-05 | Disposition: A | Discharge status: Home / Self Care | Payer: Medicare Other | Source: Ambulatory Visit | Attending: General Surgery | Admitting: General Surgery

## 2017-08-05 DIAGNOSIS — D0502 Lobular carcinoma in situ of left breast: Secondary | ICD-10-CM

## 2017-08-05 DIAGNOSIS — R921 Mammographic calcification found on diagnostic imaging of breast: Secondary | ICD-10-CM | POA: Diagnosis not present

## 2017-08-05 DIAGNOSIS — R922 Inconclusive mammogram: Secondary | ICD-10-CM | POA: Diagnosis not present

## 2017-08-12 ENCOUNTER — Ambulatory Visit: Payer: Medicare Other

## 2017-08-16 ENCOUNTER — Encounter: Payer: Self-pay | Admitting: General Surgery

## 2017-08-16 ENCOUNTER — Ambulatory Visit (INDEPENDENT_AMBULATORY_CARE_PROVIDER_SITE_OTHER): Payer: Medicare Other | Admitting: General Surgery

## 2017-08-16 ENCOUNTER — Other Ambulatory Visit: Payer: Self-pay | Admitting: General Surgery

## 2017-08-16 VITALS — BP 132/74 | HR 76 | Resp 12 | Ht 66.0 in | Wt 220.0 lb

## 2017-08-16 DIAGNOSIS — R921 Mammographic calcification found on diagnostic imaging of breast: Secondary | ICD-10-CM

## 2017-08-16 MED ORDER — TAMOXIFEN CITRATE 20 MG PO TABS: ORAL_TABLET | ORAL | 3 refills | Status: DC

## 2017-08-16 MED ORDER — POLYETHYLENE GLYCOL 3350 17 GM/SCOOP PO POWD: ORAL | 0 refills | Status: DC

## 2017-08-16 NOTE — Patient Instructions (Addendum)
The patient will be scheduled for a colonoscopy and left breast stereotactic biopsy.  Stereotactic Breast Biopsy A breast biopsy is a procedure in which a sample of suspicious breast tissue is removed from your breast. In a stereotactic breast biopsy, an X-ray of the breast (mammogram) is used during the procedure to locate the area of the breast where the tissue sample will be taken. After the procedure, the tissue that is removed from the breast is examined under a microscope to see if cancerous cells are present. You may need a stereotactic breast biopsy if you have:  Any undiagnosed breast mass (tumor).  Calcium deposits (calcifications) or abnormalities seen on a mammogram, ultrasound results, or MRI results.  Nipple abnormalities, dimpling, crusting, or ulcerations.  Abnormal discharge from the nipple, especially blood.  Redness, swelling, and pain of the breast.  Suspicious changes in the breast seen on your mammogram.  If the breast abnormality is found to be cancerous (malignant), a stereotactic breast biopsy can help to determine what the best treatment is for you. Tell a health care provider about:  Any allergies you have.  All medicines you are taking, including vitamins, herbs, eye drops, creams, and over-the-counter medicines.  Any problems you or family members have had with anesthetic medicines.  Any blood disorders you have.  Any surgeries you have had.  Any medical conditions you have.  Whether you are pregnant or may be pregnant. What are the risks? Generally, this is a safe procedure. However, problems may occur, including:  Infection at the needle-insertion site.  Bleeding.  Soreness.  Allergic reactions to medicines.  Bruising and swelling of the breast.  Change in the shape of the breast.  Damage to other tissues.  What happens before the procedure?  Ask your health care provider about: ? Changing or stopping your regular medicines. This is  especially important if you are taking diabetes medicines or blood thinners. ? Taking medicines such as aspirin and ibuprofen. These medicines can thin your blood. Do not take these medicines before your procedure if your health care provider instructs you not to.  Wear a good support bra to the procedure.  Do not put on antiperspirant or deodorant the day of the procedure, because it may cause white spots on the X-ray images.  You may be screened for extra fluid around the lymph nodes (lymphedema).  You will be asked to remove jewelry, dentures, eyeglasses, metal objects, or clothing that might interfere with the X-ray images. What happens during the procedure?  You will lie face-down on a table. Your breast will pass through an opening in the table.  Your breast will be gently compressed into an unchanging (fixed) position between a breast platform and a compression plate. Try to stay as relaxed as possible during the procedure. You will need to stay in one position for the length of the procedure.  X-rays will be used to locate the breast lump.  Your skin will be washed with soap, and you will be given a medicine to numb the area (local anesthetic).  A small incision will be made in your breast.  The tip of the biopsy needle will be directed through the incision. Several small pieces of suspicious tissue will be collected.  Then, a final set of X-ray images will be taken. If they show that the suspicious tissue has been mostly or completely removed, a small clip will be left at the biopsy site. This will allow the biopsy site to be easily located if  the results of the biopsy show that the tissue is cancerous.  The incision will be stitched (sutured) or taped and covered with a bandage (dressing). Your health care provider may apply a bandage that is wrapped tightly around your chest (pressure dressing) and an ice pack to prevent bleeding and swelling in the breast. The procedure may  vary among health care providers and hospitals. What happens after the procedure?  If you are doing well and have no problems, you will be allowed to go home.  It is up to you to get the results of your procedure. Ask your health care provider, or the department that is doing the procedure, when your results will be ready. This information is not intended to replace advice given to you by your health care provider. Make sure you discuss any questions you have with your health care provider. Document Released: 08/15/2003 Document Revised: 07/30/2016 Document Reviewed: 07/30/2016 Elsevier Interactive Patient Education  [employer].   Colonoscopy, Adult A colonoscopy is an exam to look at the entire large intestine. During the exam, a lubricated, bendable tube is inserted into the anus and then passed into the rectum, colon, and other parts of the large intestine. A colonoscopy is often done as a part of normal colorectal screening or in response to certain symptoms, such as anemia, persistent diarrhea, abdominal pain, and blood in the stool. The exam can help screen for and diagnose medical problems, including:  Tumors.  Polyps.  Inflammation.  Areas of bleeding.  Tell a health care provider about:  Any allergies you have.  All medicines you are taking, including vitamins, herbs, eye drops, creams, and over-the-counter medicines.  Any problems you or family members have had with anesthetic medicines.  Any blood disorders you have.  Any surgeries you have had.  Any medical conditions you have.  Any problems you have had passing stool. What are the risks? Generally, this is a safe procedure. However, problems may occur, including:  Bleeding.  A tear in the intestine.  A reaction to medicines given during the exam.  Infection (rare).  What happens before the procedure? Eating and drinking restrictions Follow instructions from your health care provider about eating  and drinking, which may include:  A few days before the procedure - follow a low-fiber diet. Avoid nuts, seeds, dried fruit, raw fruits, and vegetables.  1-3 days before the procedure - follow a clear liquid diet. Drink only clear liquids, such as clear broth or bouillon, black coffee or tea, clear juice, clear soft drinks or sports drinks, gelatin dessert, and popsicles. Avoid any liquids that contain red or purple dye.  On the day of the procedure - do not eat or drink anything during the 2 hours before the procedure, or within the time period that your health care provider recommends.  Bowel prep If you were prescribed an oral bowel prep to clean out your colon:  Take it as told by your health care provider. Starting the day before your procedure, you will need to drink a large amount of medicated liquid. The liquid will cause you to have multiple loose stools until your stool is almost clear or light green.  If your skin or anus gets irritated from diarrhea, you may use these to relieve the irritation: ? Medicated wipes, such as adult wet wipes with aloe and vitamin E. ? A skin soothing-product like petroleum jelly.  If you vomit while drinking the bowel prep, take a break for up to 60  minutes and then begin the bowel prep again. If vomiting continues and you cannot take the bowel prep without vomiting, call your health care provider.  General instructions  Ask your health care provider about changing or stopping your regular medicines. This is especially important if you are taking diabetes medicines or blood thinners.  Plan to have someone take you home from the hospital or clinic. What happens during the procedure?  An IV tube may be inserted into one of your veins.  You will be given medicine to help you relax (sedative).  To reduce your risk of infection: ? Your health care team will wash or sanitize their hands. ? Your anal area will be washed with soap.  You will be asked  to lie on your side with your knees bent.  Your health care provider will lubricate a long, thin, flexible tube. The tube will have a camera and a light on the end.  The tube will be inserted into your anus.  The tube will be gently eased through your rectum and colon.  Air will be delivered into your colon to keep it open. You may feel some pressure or cramping.  The camera will be used to take images during the procedure.  A small tissue sample may be removed from your body to be examined under a microscope (biopsy). If any potential problems are found, the tissue will be sent to a lab for testing.  If small polyps are found, your health care provider may remove them and have them checked for cancer cells.  The tube that was inserted into your anus will be slowly removed. The procedure may vary among health care providers and hospitals. What happens after the procedure?  Your blood pressure, heart rate, breathing rate, and blood oxygen level will be monitored until the medicines you were given have worn off.  Do not drive for 24 hours after the exam.  You may have a small amount of blood in your stool.  You may pass gas and have mild abdominal cramping or bloating due to the air that was used to inflate your colon during the exam.  It is up to you to get the results of your procedure. Ask your health care provider, or the department performing the procedure, when your results will be ready. This information is not intended to replace advice given to you by your health care provider. Make sure you discuss any questions you have with your health care provider. Document Released: 11/13/2000 Document Revised: 09/16/2016 Document Reviewed: 01/28/2016 Elsevier Interactive Patient Education  2018 [employer].

## 2017-08-16 NOTE — Progress Notes (Signed)
Patient ID: Lindsay Russo, female   DOB: August 08, 1946, 71 y.o.   MRN: [id number]  Chief Complaint  Patient presents with  . Follow-up    mammogram    HPI Lindsay Russo is a 71 y.o. female who presents for a breast follow up. The most recent mammogram was done on 08/05/17.  Patient does perform regular self breast checks and gets regular mammograms done. Patient states she has not felt any changes in her breast or masses. She said the radiologist recommended a left stereotactic biopsy. She had a left knee replacement done in November. Patient denies gastrointestinal issues, moves her bowels daily. Denies blood/mucous in her stool.   HPI  Past Medical History:  Diagnosis Date  . Arthritis   . History of kidney stones   . Hypertension   . LCIS September 05, 2013   Left breast, ER 90%, PR 60%  . Malignant neoplasm of ascending colon [facility]) November 2007   T2, N0.5.2 cm low-grade adenocarcinoma. 0/28 nodes positive.  . Personal history of colonic polyps   . Spinal headache 1995   at [facility] spinal made me feel dead, had no control.  . Stroke ([place]) 09/17/2010   Right thalamic without residual effects    Past Surgical History:  Procedure Laterality Date  . APPENDECTOMY  1995  . BREAST BIOPSY Right August 07, 2010   Sclerotic fibroadenoma removed from the right breast at the 10:00 position  . BREAST BIOPSY Left 08/14/2013   left breast superior LCIS. per path report: Pleomorphic LCIS has similar behavior to DCIS.  [name] BREAST BIOPSY Left 08/14/2013   left breast superior fibroadenoma  . BREAST LUMPECTOMY Left 09/05/2013   clear margins  . BREAST SURGERY Left 09/05/13   LCIS, ER 90%, PR 60%, negative margins.  . COLONOSCOPY  August 03, 2012   8 mm tubulovillous adenoma removed from the transverse colon. Followup exam in 2018 plan.  . DOPPLER ECHOCARDIOGRAPHY  09/17/2010   EF 55%, Mild to moderate MR and mild to moderate TR  . HEEL SPUR EXCISION Left 2005  . JOINT REPLACEMENT Right  2012  . KNEE ARTHROPLASTY Left 10/26/2016   Procedure: COMPUTER ASSISTED TOTAL KNEE ARTHROPLASTY;  Surgeon: Dereck Leep, MD;  Location: [facility] ORS;  Service: Orthopedics;  Laterality: Left;  . LUMBAR DISC SURGERY  2009   L4-5 microdiscectomy Dr. Mauri Pole  . OVARIAN CYST REMOVAL  1980  . PARTIAL COLECTOMY Right 2007   Dr. Bary Castilla  . REPLACEMENT TOTAL KNEE Right 12/2010    Family History  Problem Relation Age of Onset  . Gastric cancer Father   . Brain cancer Father   . Hypertension Sister   . Heart disease Other   . Ulcers Other   . Epilepsy Other     Social History Social History  Substance Use Topics  . Smoking status: Never Smoker  . Smokeless tobacco: Never Used  . Alcohol use No    Allergies  Allergen Reactions  . Tegaderm Ag Mesh [Silver] Other (See Comments)    blisters  . Amoxicillin Hives and Rash    Has patient had a PCN reaction causing immediate rash, facial/tongue/throat swelling, SOB or lightheadedness with hypotension: Yes Has patient had a PCN reaction causing severe rash involving mucus membranes or skin necrosis: Yes Has patient had a PCN reaction that required hospitalization No Has patient had a PCN reaction occurring within the last 10 years: Yes If all of the above answers are "NO", then may proceed with Cephalosporin use.   [name]  Sulfa Antibiotics Rash    Current Outpatient Prescriptions  Medication Sig Dispense Refill  . acetaminophen (TYLENOL) 500 MG tablet Take 1,000 mg by mouth every 6 (six) hours as needed for moderate pain or headache.    [name] amLODipine (NORVASC) 10 MG tablet Take 1 tablet (10 mg total) by mouth daily. 90 tablet 4  . chlorthalidone (HYGROTON) 25 MG tablet Take 1 tablet (25 mg total) by mouth daily. 90 tablet 3  . Coenzyme Q10 (COQ10) 200 MG CAPS Take 1 capsule by mouth daily.    [name] lovastatin (MEVACOR) 40 MG tablet TAKE ONE TABLET BY MOUTH EVERY NIGHT AT BEDTIME 90 tablet 4  . meclizine (ANTIVERT) 25 MG tablet Take 1 tablet (25 mg  total) by mouth 3 (three) times daily as needed for dizziness. 30 tablet 0  . promethazine (PHENERGAN) 25 MG tablet Take 1 tablet (25 mg total) by mouth every 8 (eight) hours as needed for nausea or vomiting. 20 tablet 0  . tamoxifen (NOLVADEX) 20 MG tablet TAKE ONE (1) TABLET BY MOUTH EVERY DAY 90 tablet 3  . polyethylene glycol powder (GLYCOLAX/MIRALAX) powder 255 grams one bottle for colonoscopy prep 255 g 0   No current facility-administered medications for this visit.     Review of Systems Review of Systems  Constitutional: Negative.   Respiratory: Negative.   Cardiovascular: Negative.   Gastrointestinal: Negative.     Blood pressure 132/74, pulse 76, resp. rate 12, height 5\' 6"  (1.676 m), weight 220 lb (99.8 kg).  Physical Exam Physical Exam  Constitutional: She is oriented to person, place, and time. She appears well-developed and well-nourished.  Eyes: Conjunctivae are normal. No scleral icterus.  Neck: Neck supple. No thyromegaly present.  Cardiovascular: Normal rate, regular rhythm and normal heart sounds.   Pulmonary/Chest: Effort normal and breath sounds normal. Right breast exhibits no inverted nipple, no mass, no nipple discharge, no skin change and no tenderness. Left breast exhibits no inverted nipple, no mass, no nipple discharge, no skin change and no tenderness. Breasts are asymmetrical (cup size difference right>keft ).    Abdominal: Soft. Bowel sounds are normal. There is no tenderness.    Lymphadenopathy:    She has no cervical adenopathy.    She has no axillary adenopathy.       Right: No supraclavicular adenopathy present.       Left: No supraclavicular adenopathy present.  Neurological: She is alert and oriented to person, place, and time.  Skin: Skin is warm and dry.    Data Reviewed Bilateral diagnostic mammograms of 08/05/2017 reviewed. Focal area of calcifications in the inferior medial left breast. BI-RADS-4. 08/03/2012 colonoscopy showed a  single 5 mm tubular adenoma in the transverse colon.  Status post right hemicolectomy 2007.  Assessment    New foci microcalcifications left breast.  Past history colonic polyps.   Past history pleomorphic LCIS left breast, one year of additional tamoxifen therapy plan.    Plan     The patient will be scheduled for a colonoscopy and left breast stereotactic biopsy.  Colonoscopy with possible biopsy/polypectomy prn: Information regarding the procedure, including its potential risks and complications (including but not limited to perforation of the bowel, which may require emergency surgery to repair, and bleeding) was verbally given to the patient. Educational information regarding lower intestinal endoscopy was given to the patient. Written instructions for how to complete the bowel prep using Miralax were provided. The importance of drinking ample fluids to avoid dehydration as a result of the prep  emphasized.  The stereotactic procedure was reviewed with the patient. The potential for bleeding, infection and pain was reviewed. At this time, the benefits outweigh the risk, and the patient is amenable to proceed.  HPI, Physical Exam, Assessment and Plan have been scribed under the direction and in the presence of Hervey Ard, MD.  Verlene Mayer, CMA  I have completed the exam and reviewed the above documentation for accuracy and completeness.  I agree with the above.  Haematologist has been used and any errors in dictation or transcription are unintentional.  Hervey Ard, M.D., F.A.C.S.       Robert Bellow 08/16/2017, 4:12 PM  Patient has been scheduled for a left breast stereotactic biopsy at [facility] for 08-23-17 at 1 pm. She will check-in at the [facility] at 12:30 pm. This patient is aware of date, time, and instructions. Patient verbalizes understanding.  Patient has been scheduled for a colonoscopy on 10-06-17 at [facility]. Miralax prescription has  been sent in to the patient's pharmacy today. Colonoscopy instructions have been reviewed with the patient. This patient is aware to call the office if they have further questions.   Dominga Ferry, CMA

## 2017-08-23 ENCOUNTER — Ambulatory Visit
Admission: RE | Admit: 2017-08-23 | Discharge: 2017-08-23 | Disposition: A | Discharge status: Home / Self Care | Payer: Medicare Other | Source: Ambulatory Visit | Attending: General Surgery | Admitting: General Surgery

## 2017-08-23 DIAGNOSIS — R921 Mammographic calcification found on diagnostic imaging of breast: Secondary | ICD-10-CM | POA: Diagnosis not present

## 2017-08-23 DIAGNOSIS — R920 Mammographic microcalcification found on diagnostic imaging of breast: Secondary | ICD-10-CM | POA: Diagnosis not present

## 2017-08-23 HISTORY — PX: BREAST BIOPSY: SHX20

## 2017-08-24 ENCOUNTER — Telehealth: Payer: Self-pay | Admitting: General Surgery

## 2017-08-24 LAB — SURGICAL PATHOLOGY

## 2017-08-24 NOTE — Telephone Encounter (Signed)
Results of yesterday stereotactic biopsy reviewed. Fibroadenomatous changes. No malignancy.  She reports that she is doing well.  Discrepancy between biopsy channel and final location of clip reviewed.  Pre-and postbiopsy images had been reviewed. There were no calcifications in the area where the clip presently resides in the area of calcifications in the lower inner aspect of the breast were no longer evident on postbiopsy imaging. This may be all clip migration.  We'll arrange for follow-up left breast diagnostic mammogram in 6 months.

## 2017-09-09 ENCOUNTER — Telehealth: Payer: Self-pay | Admitting: *Deleted

## 2017-09-09 NOTE — Telephone Encounter (Signed)
Per Caryl-Lyn, patient called the office asking that I return her phone call.   Patient states that she has been having left breast pain that started 2-3 days ago. The patient had a left stereo biopsy completed on 08-23-17 by Dr. Bary Castilla.   The patient states she is using tylenol which has helped but pain still comes back.   An appointment has been made for the patient to see Dr. Bary Castilla in the near future.   She was instructed to continue tylenol as needed prn pain and use a heating pad. She verbalizes understanding.

## 2017-09-21 ENCOUNTER — Ambulatory Visit: Payer: Medicare Other | Admitting: General Surgery

## 2017-09-29 ENCOUNTER — Telehealth: Payer: Self-pay | Admitting: *Deleted

## 2017-09-29 NOTE — Telephone Encounter (Signed)
Patient was contacted today and confirms no medication changes since her last office visit.   This patient reports that she has picked up Miralax prescription.  We will proceed with colonoscopy as scheduled for 10-06-17 at [facility].   Patient was encouraged to call the office should she have further questions.

## 2017-10-04 ENCOUNTER — Ambulatory Visit (INDEPENDENT_AMBULATORY_CARE_PROVIDER_SITE_OTHER): Payer: Medicare Other | Admitting: Family Medicine

## 2017-10-04 VITALS — BP 152/74 | HR 66 | Temp 99.0°F | Resp 14 | Wt 225.0 lb

## 2017-10-04 DIAGNOSIS — R601 Generalized edema: Secondary | ICD-10-CM

## 2017-10-04 DIAGNOSIS — I10 Essential (primary) hypertension: Secondary | ICD-10-CM

## 2017-10-04 DIAGNOSIS — R7303 Prediabetes: Secondary | ICD-10-CM | POA: Diagnosis not present

## 2017-10-04 DIAGNOSIS — Z23 Encounter for immunization: Secondary | ICD-10-CM | POA: Diagnosis not present

## 2017-10-04 LAB — POCT GLYCOSYLATED HEMOGLOBIN (HGB A1C)
ESTIMATED AVERAGE GLUCOSE: 137
HEMOGLOBIN A1C: 6.4

## 2017-10-04 NOTE — Progress Notes (Signed)
Patient: Lindsay Russo Female    DOB: [birth year]   71 y.o.   MRN: [id number] Visit Date: 10/04/2017  Today's Provider: Lelon Huh, MD   Chief Complaint  Patient presents with  . Hypertension  . Hyperlipidemia  . Edema   Subjective:    HPI   Hypertension, follow-up:  BP Readings from Last 3 Encounters:  10/04/17 (!) 152/74  08/16/17 132/74  05/03/17 (!) 142/80   She was last seen for hypertension 5 months ago.  BP at that visit was 142/80.  Management since that visit includes; no changes. Patient is not having any side effects from the medications. She is checking her b/p at home and readings are usually 130-135/90s. No chest pain or tightness.   Lipid/Cholesterol, Follow-up:   Last seen for this 5 months ago.  Management since that visit includes; labs checked, no changes. Tolerating medication well. Patient does walk at least 3 times a week for about 30 minutes. Patient tries to eat in moderation. Last Lipid Panel:    Component Value Date/Time   CHOL 180 01/25/2017 1101   TRIG 200 (H) 01/25/2017 1101   HDL 39 (L) 01/25/2017 1101   CHOLHDL 4.6 (H) 01/25/2017 1101   LDLCALC 101 (H) 01/25/2017 1101    Wt Readings from Last 3 Encounters:  10/04/17 225 lb (102.1 kg)  08/16/17 220 lb (99.8 kg)  05/03/17 222 lb (100.7 kg)    Pre-diabetes From 01/25/2015-Hemoglobin A1c 5.6. Patient is not checking her sugar at home. Patient is not on any medications for this. Sometimes has tingling and hot sensation in her feet that last for about 2 to 3 minutes and then goes away.  Generalized edema From 05/03/2017-no changes. Sometimes gets swelling in her ankles.   Patient is scheduled to have colonoscopy on Wednesday on 7th and would like to wait to get flu shot after that. She would also like to discuss fatigue and what can she take- like supplement to help with this.  Allergies  Allergen Reactions  . Tegaderm Ag Mesh [Silver] Other (See Comments)    blisters    . Amoxicillin Hives and Rash    Has patient had a PCN reaction causing immediate rash, facial/tongue/throat swelling, SOB or lightheadedness with hypotension: Yes Has patient had a PCN reaction causing severe rash involving mucus membranes or skin necrosis: Yes Has patient had a PCN reaction that required hospitalization No Has patient had a PCN reaction occurring within the last 10 years: Yes If all of the above answers are "NO", then may proceed with Cephalosporin use.   . Sulfa Antibiotics Rash     Current Outpatient Medications:  .  acetaminophen (TYLENOL) 500 MG tablet, Take 1,000 mg by mouth every 6 (six) hours as needed for moderate pain or headache., Disp: , Rfl:  .  amLODipine (NORVASC) 10 MG tablet, Take 1 tablet (10 mg total) by mouth daily., Disp: 90 tablet, Rfl: 4 .  chlorthalidone (HYGROTON) 25 MG tablet, Take 1 tablet (25 mg total) by mouth daily., Disp: 90 tablet, Rfl: 3 .  Coenzyme Q10 (COQ10) 200 MG CAPS, Take 1 capsule by mouth daily., Disp: , Rfl:  .  lovastatin (MEVACOR) 40 MG tablet, TAKE ONE TABLET BY MOUTH EVERY NIGHT AT BEDTIME, Disp: 90 tablet, Rfl: 4 .  meclizine (ANTIVERT) 25 MG tablet, Take 1 tablet (25 mg total) by mouth 3 (three) times daily as needed for dizziness., Disp: 30 tablet, Rfl: 0 .  polyethylene glycol powder (GLYCOLAX/MIRALAX) powder,  255 grams one bottle for colonoscopy prep, Disp: 255 g, Rfl: 0 .  promethazine (PHENERGAN) 25 MG tablet, Take 1 tablet (25 mg total) by mouth every 8 (eight) hours as needed for nausea or vomiting., Disp: 20 tablet, Rfl: 0 .  tamoxifen (NOLVADEX) 20 MG tablet, TAKE ONE (1) TABLET BY MOUTH EVERY DAY, Disp: 90 tablet, Rfl: 3  Review of Systems  Constitutional: Positive for fatigue. Negative for appetite change, chills and fever.  Respiratory: Negative for chest tightness and shortness of breath.   Cardiovascular: Positive for leg swelling (ankles sometimes). Negative for chest pain and palpitations.  Gastrointestinal:  Negative for abdominal pain, nausea and vomiting.  Musculoskeletal: Positive for joint swelling. Negative for arthralgias, back pain and gait problem.  Neurological: Positive for dizziness (sometimes) and numbness (in feet at times). Negative for weakness.    Social History   Tobacco Use  . Smoking status: Never Smoker  . Smokeless tobacco: Never Used  Substance Use Topics  . Alcohol use: No   Objective:   BP (!) 152/74   Pulse 66   Temp 99 F (37.2 C)   Resp 14   Wt 225 lb (102.1 kg)   BMI 36.32 kg/m  Vitals:   10/04/17 0859  BP: (!) 152/74  Pulse: 66  Resp: 14  Temp: 99 F (37.2 C)  Weight: 225 lb (102.1 kg)     Physical Exam   General Appearance:    Alert, cooperative, no distress  Eyes:    PERRL, conjunctiva/corneas clear, EOM's intact       Lungs:     Clear to auscultation bilaterally, respirations unlabored  Heart:    Regular rate and rhythm  Neurologic:   Awake, alert, oriented x 3. No apparent focal neurological           defect.       Results for orders placed or performed in visit on 10/04/17  POCT HgB A1C  Result Value Ref Range   Hemoglobin A1C 6.4    Est. average glucose Bld gHb Est-mCnc 137        Assessment & Plan:     1. Essential hypertension BP worse today, but admits to poor dietary habits which she is going to work on to lose weight. Continue current medications.    2. . Pre-diabetes Discussed diet as above.  - POCT HgB A1C  3. Need for immunization against influenza  - Flu vaccine HIGH DOSE PF (Fluzone High dose)  Return in about 3 months (around 01/04/2018) for Yearly Physical.       Lelon Huh, MD  [facility]

## 2017-10-04 NOTE — Patient Instructions (Signed)
   Try taking a daily B-complex vitamin to help energy.    Stop drinking soft drinks   Avoid sugar in diet   Engage in moderate exercise such as brisk walk 150 minutes every week.

## 2017-10-06 ENCOUNTER — Encounter
Admission: RE | Disposition: A | Discharge status: Home / Self Care | Payer: Self-pay | Source: Ambulatory Visit | Attending: General Surgery

## 2017-10-06 ENCOUNTER — Ambulatory Visit: Payer: Medicare Other | Admitting: Anesthesiology

## 2017-10-06 ENCOUNTER — Ambulatory Visit
Admission: RE | Admit: 2017-10-06 | Discharge: 2017-10-06 | Disposition: A | Discharge status: Home / Self Care | Payer: Medicare Other | Source: Ambulatory Visit | Attending: General Surgery | Admitting: General Surgery

## 2017-10-06 ENCOUNTER — Encounter: Payer: Self-pay | Admitting: *Deleted

## 2017-10-06 DIAGNOSIS — K5790 Diverticulosis of intestine, part unspecified, without perforation or abscess without bleeding: Secondary | ICD-10-CM | POA: Diagnosis not present

## 2017-10-06 DIAGNOSIS — Z86010 Personal history of colonic polyps: Secondary | ICD-10-CM | POA: Insufficient documentation

## 2017-10-06 DIAGNOSIS — Z880 Allergy status to penicillin: Secondary | ICD-10-CM | POA: Insufficient documentation

## 2017-10-06 DIAGNOSIS — I739 Peripheral vascular disease, unspecified: Secondary | ICD-10-CM | POA: Diagnosis not present

## 2017-10-06 DIAGNOSIS — D126 Benign neoplasm of colon, unspecified: Secondary | ICD-10-CM

## 2017-10-06 DIAGNOSIS — Z8673 Personal history of transient ischemic attack (TIA), and cerebral infarction without residual deficits: Secondary | ICD-10-CM | POA: Diagnosis not present

## 2017-10-06 DIAGNOSIS — Z87442 Personal history of urinary calculi: Secondary | ICD-10-CM | POA: Diagnosis not present

## 2017-10-06 DIAGNOSIS — Z85038 Personal history of other malignant neoplasm of large intestine: Secondary | ICD-10-CM | POA: Insufficient documentation

## 2017-10-06 DIAGNOSIS — Z882 Allergy status to sulfonamides status: Secondary | ICD-10-CM | POA: Insufficient documentation

## 2017-10-06 DIAGNOSIS — Z9049 Acquired absence of other specified parts of digestive tract: Secondary | ICD-10-CM | POA: Diagnosis not present

## 2017-10-06 DIAGNOSIS — R921 Mammographic calcification found on diagnostic imaging of breast: Secondary | ICD-10-CM

## 2017-10-06 DIAGNOSIS — Z980 Intestinal bypass and anastomosis status: Secondary | ICD-10-CM | POA: Diagnosis not present

## 2017-10-06 DIAGNOSIS — R7303 Prediabetes: Secondary | ICD-10-CM | POA: Insufficient documentation

## 2017-10-06 DIAGNOSIS — M1990 Unspecified osteoarthritis, unspecified site: Secondary | ICD-10-CM | POA: Diagnosis not present

## 2017-10-06 DIAGNOSIS — Z888 Allergy status to other drugs, medicaments and biological substances status: Secondary | ICD-10-CM | POA: Diagnosis not present

## 2017-10-06 DIAGNOSIS — I10 Essential (primary) hypertension: Secondary | ICD-10-CM | POA: Insufficient documentation

## 2017-10-06 DIAGNOSIS — Z79899 Other long term (current) drug therapy: Secondary | ICD-10-CM | POA: Diagnosis not present

## 2017-10-06 DIAGNOSIS — Z1211 Encounter for screening for malignant neoplasm of colon: Secondary | ICD-10-CM | POA: Insufficient documentation

## 2017-10-06 DIAGNOSIS — Z96651 Presence of right artificial knee joint: Secondary | ICD-10-CM | POA: Diagnosis not present

## 2017-10-06 DIAGNOSIS — K635 Polyp of colon: Secondary | ICD-10-CM | POA: Diagnosis not present

## 2017-10-06 HISTORY — DX: Benign neoplasm of colon, unspecified: D12.6

## 2017-10-06 HISTORY — PX: COLONOSCOPY WITH PROPOFOL: SHX5780

## 2017-10-06 SURGERY — COLONOSCOPY WITH PROPOFOL
Anesthesia: General

## 2017-10-06 MED ORDER — SODIUM CHLORIDE 0.9 % IV SOLN
INTRAVENOUS | Status: DC
  Administered 2017-10-06: 1000 mL via INTRAVENOUS

## 2017-10-06 MED ORDER — CLINDAMYCIN PHOSPHATE 600 MG/50ML IV SOLN
INTRAVENOUS | Status: AC
  Administered 2017-10-06: 600 mg via INTRAVENOUS
  Filled 2017-10-06: qty 50

## 2017-10-06 MED ORDER — CLINDAMYCIN PHOSPHATE 600 MG/50ML IV SOLN
600.0000 mg | Freq: Once | INTRAVENOUS | Status: AC
  Administered 2017-10-06: 600 mg via INTRAVENOUS

## 2017-10-06 MED ORDER — PROPOFOL 500 MG/50ML IV EMUL
INTRAVENOUS | Status: AC
  Filled 2017-10-06: qty 50

## 2017-10-06 MED ORDER — PROPOFOL 10 MG/ML IV BOLUS
INTRAVENOUS | Status: DC | PRN
  Administered 2017-10-06: 40 mg via INTRAVENOUS
  Administered 2017-10-06: 100 mg via INTRAVENOUS

## 2017-10-06 MED ORDER — PROPOFOL 500 MG/50ML IV EMUL
INTRAVENOUS | Status: DC | PRN
  Administered 2017-10-06: 140 ug/kg/min via INTRAVENOUS

## 2017-10-06 NOTE — Transfer of Care (Signed)
Immediate Anesthesia Transfer of Care Note  Patient: Lindsay Russo  Procedure(s) Performed: COLONOSCOPY WITH PROPOFOL (N/A )  Patient Location: Endoscopy Unit  Anesthesia Type:General  Level of Consciousness: sedated  Airway & Oxygen Therapy: Patient Spontanous Breathing and Patient connected to nasal cannula oxygen  Post-op Assessment: Report given to RN and Post -op Vital signs reviewed and stable  Post vital signs: Reviewed and stable  Last Vitals:  Vitals:   10/06/17 0819  BP: (!) 159/75  Pulse: 74  Resp: (!) 22  Temp: 36.8 C  SpO2: 100%    Last Pain:  Vitals:   10/06/17 0819  TempSrc: Tympanic         Complications: No apparent anesthesia complications

## 2017-10-06 NOTE — H&P (Signed)
Lindsay Russo [id number] January 10, 1946      HPI: 10 years s/p right colectomy for a T2, N0 lesion. Negative exam in 2013. Tolerated prep well. S/P total knee replacement 12 months ago.  No breast complaints s/p stereo biopsy (benign).   Medications Prior to Admission  Medication Sig Dispense Refill Last Dose  . acetaminophen (TYLENOL) 500 MG tablet Take 1,000 mg by mouth every 6 (six) hours as needed for moderate pain or headache.   Taking  . amLODipine (NORVASC) 10 MG tablet Take 1 tablet (10 mg total) by mouth daily. 90 tablet 4 Taking  . chlorthalidone (HYGROTON) 25 MG tablet Take 1 tablet (25 mg total) by mouth daily. 90 tablet 3 Taking  . Coenzyme Q10 (COQ10) 200 MG CAPS Take 1 capsule by mouth daily.   Taking  . lovastatin (MEVACOR) 40 MG tablet TAKE ONE TABLET BY MOUTH EVERY NIGHT AT BEDTIME 90 tablet 4 Taking  . meclizine (ANTIVERT) 25 MG tablet Take 1 tablet (25 mg total) by mouth 3 (three) times daily as needed for dizziness. 30 tablet 0 Taking  . polyethylene glycol powder (GLYCOLAX/MIRALAX) powder 255 grams one bottle for colonoscopy prep 255 g 0 Taking  . promethazine (PHENERGAN) 25 MG tablet Take 1 tablet (25 mg total) by mouth every 8 (eight) hours as needed for nausea or vomiting. 20 tablet 0 Taking  . tamoxifen (NOLVADEX) 20 MG tablet TAKE ONE (1) TABLET BY MOUTH EVERY DAY 90 tablet 3 Taking   Allergies  Allergen Reactions  . Tegaderm Ag Mesh [Silver] Other (See Comments)    blisters  . Amoxicillin Hives and Rash    Has patient had a PCN reaction causing immediate rash, facial/tongue/throat swelling, SOB or lightheadedness with hypotension: Yes Has patient had a PCN reaction causing severe rash involving mucus membranes or skin necrosis: Yes Has patient had a PCN reaction that required hospitalization No Has patient had a PCN reaction occurring within the last 10 years: Yes If all of the above answers are "NO", then may proceed with Cephalosporin use.   . Sulfa  Antibiotics Rash   Past Medical History:  Diagnosis Date  . Arthritis   . History of kidney stones   . Hypertension   . LCIS September 05, 2013   Left breast, ER 90%, PR 60%  . Malignant neoplasm of ascending colon [facility]) November 2007   T2, N0.5.2 cm low-grade adenocarcinoma. 0/28 nodes positive.  . Personal history of colonic polyps   . Spinal headache 1995   at [facility] spinal made me feel dead, had no control.  . Stroke ([place]) 09/17/2010   Right thalamic without residual effects   Past Surgical History:  Procedure Laterality Date  . APPENDECTOMY  1995  . BREAST BIOPSY Right August 07, 2010   Sclerotic fibroadenoma removed from the right breast at the 10:00 position  . BREAST BIOPSY Left 08/14/2013   left breast superior LCIS. per path report: Pleomorphic LCIS has similar behavior to DCIS.  [name] BREAST BIOPSY Left 08/14/2013   left breast superior fibroadenoma  . BREAST BIOPSY Left 08/23/2017   stereo bx with Dr. Bary Castilla of lower inner quad. Cylinder clip. path pending  . BREAST LUMPECTOMY Left 09/05/2013   clear margins  . BREAST SURGERY Left 09/05/13   LCIS, ER 90%, PR 60%, negative margins.  . COLONOSCOPY  August 03, 2012   8 mm tubulovillous adenoma removed from the transverse colon. Followup exam in 2018 plan.  . DOPPLER ECHOCARDIOGRAPHY  09/17/2010   EF 55%, Mild  to moderate MR and mild to moderate TR  . HEEL SPUR EXCISION Left 2005  . JOINT REPLACEMENT Right 2012  . LUMBAR DISC SURGERY  2009   L4-5 microdiscectomy Dr. Mauri Pole  . OVARIAN CYST REMOVAL  1980  . PARTIAL COLECTOMY Right 2007   Dr. Bary Castilla  . REPLACEMENT TOTAL KNEE Right 12/2010   Social History   Socioeconomic History  . Marital status: Married    Spouse name: Not on file  . Number of children: 2  . Years of education: Not on file  . Highest education level: Not on file  Social Needs  . Financial resource strain: Not on file  . Food insecurity - worry: Not on file  . Food insecurity - inability:  Not on file  . Transportation needs - medical: Not on file  . Transportation needs - non-medical: Not on file  Occupational History  . Occupation: Retired  Tobacco Use  . Smoking status: Never Smoker  . Smokeless tobacco: Never Used  Substance and Sexual Activity  . Alcohol use: No  . Drug use: No  . Sexual activity: Not on file  Other Topics Concern  . Not on file  Social History Narrative  . Not on file   Social History   Social History Narrative  . Not on file     ROS: Negative.     PE: HEENT: Negative. Lungs: Clear. Cardio: RR.    Assessment/Plan:  Proceed with planned colonoscopy.  Clindamycin for prophylaxis of knee prosthesis.   Robert Bellow 10/06/2017

## 2017-10-06 NOTE — Anesthesia Preprocedure Evaluation (Signed)
Anesthesia Evaluation  Patient identified by MRN, date of birth, ID band Patient awake    Reviewed: Allergy & Precautions, H&P , NPO status , Patient's Chart, lab work & pertinent test results  History of Anesthesia Complications (+) POST - OP SPINAL HEADACHE and history of anesthetic complications  Airway Mallampati: III  TM Distance: <3 FB Neck ROM: limited    Dental  (+) Chipped, Poor Dentition   Pulmonary neg shortness of breath,           Cardiovascular Exercise Tolerance: Good hypertension, (-) angina+ Peripheral Vascular Disease  (-) Past MI      Neuro/Psych  Headaches, CVA negative psych ROS   GI/Hepatic negative GI ROS, Neg liver ROS, neg GERD  ,  Endo/Other  negative endocrine ROS  Renal/GU negative Renal ROS  negative genitourinary   Musculoskeletal  (+) Arthritis ,   Abdominal   Peds  Hematology negative hematology ROS (+)   Anesthesia Other Findings Signs and symptoms suggestive of sleep apnea    Past Medical History: No date: Arthritis No date: History of kidney stones No date: Hypertension September 05, 2013: LCIS     Comment:  Left breast, ER 90%, PR 60% November 2007: Malignant neoplasm of ascending colon ([facility])     Comment:  T2, N0.5.2 cm low-grade adenocarcinoma. 0/28 nodes               positive. No date: Personal history of colonic polyps 1995: Spinal headache     Comment:  at [facility] spinal made me feel dead, had no control. 10-05-10: Stroke [facility])     Comment:  Right thalamic without residual effects  Past Surgical History: 1995: APPENDECTOMY August 07, 2010: BREAST BIOPSY; Right     Comment:  Sclerotic fibroadenoma removed from the right breast at               the 10:00 position 08/14/2013: BREAST BIOPSY; Left     Comment:  left breast superior LCIS. per path report: Pleomorphic               LCIS has similar behavior to DCIS. 08/14/2013: BREAST BIOPSY; Left     Comment:   left breast superior fibroadenoma 08/23/2017: BREAST BIOPSY; Left     Comment:  stereo bx with Dr. Bary Castilla of lower inner quad. Cylinder              clip. path pending 09/05/2013: BREAST LUMPECTOMY; Left     Comment:  clear margins 09/05/13: BREAST SURGERY; Left     Comment:  LCIS, ER 90%, PR 60%, negative margins. August 03, 2012: COLONOSCOPY     Comment:  8 mm tubulovillous adenoma removed from the transverse               colon. Followup exam in 2018 plan. 10/05/2010: DOPPLER ECHOCARDIOGRAPHY     Comment:  EF 55%, Mild to moderate MR and mild to moderate TR 2005: HEEL SPUR EXCISION; Left 2012: JOINT REPLACEMENT; Right 2009: LUMBAR DISC SURGERY     Comment:  L4-5 microdiscectomy Dr. Mauri Pole 1980: OVARIAN CYST REMOVAL 2007: PARTIAL COLECTOMY; Right     Comment:  Dr. Bary Castilla 12/2010: REPLACEMENT TOTAL KNEE; Right  BMI    Body Mass Index:  35.83 kg/m      Reproductive/Obstetrics negative OB ROS                             Anesthesia Physical Anesthesia Plan  ASA: III  Anesthesia Plan: General   Post-op Pain Management:    Induction: Intravenous  PONV Risk Score and Plan: 3 and Propofol infusion  Airway Management Planned: Natural Airway and Nasal Cannula  Additional Equipment:   Intra-op Plan:   Post-operative Plan:   Informed Consent: I have reviewed the patients History and Physical, chart, labs and discussed the procedure including the risks, benefits and alternatives for the proposed anesthesia with the patient or authorized representative who has indicated his/her understanding and acceptance.   Dental Advisory Given  Plan Discussed with: Anesthesiologist, CRNA and Surgeon  Anesthesia Plan Comments: (Patient consented for risks of anesthesia including but not limited to:  - adverse reactions to medications - risk of intubation if required - damage to teeth, lips or other oral mucosa - sore throat or hoarseness - Damage to  heart, brain, lungs or loss of life  Patient voiced understanding.)        Anesthesia Quick Evaluation

## 2017-10-06 NOTE — Op Note (Addendum)
[facility] Gastroenterology Patient Name: Lindsay Russo Procedure Date: 10/06/2017 8:45 AM MRN: [id number] Account #: [id number] Date of Birth: Dec 31, 1945 Admit Type: Outpatient Age: 71 Room: [facility] ENDO ROOM 1 Gender: Female Note Status: Finalized Procedure:            Colonoscopy Indications:          High risk colon cancer surveillance: Personal history                        of colon cancer Providers:            Robert Bellow, MD Referring MD:         Kirstie Peri. Caryn Section, MD (Referring MD) Medicines:            Monitored Anesthesia Care Procedure:            Pre-Anesthesia Assessment:                       - Prior to the procedure, a History and Physical was                        performed, and patient medications, allergies and                        sensitivities were reviewed. The patient's tolerance of                        previous anesthesia was reviewed.                       - The risks and benefits of the procedure and the                        sedation options and risks were discussed with the                        patient. All questions were answered and informed                        consent was obtained.                       After obtaining informed consent, the colonoscope was                        passed under direct vision. Throughout the procedure,                        the patient's blood pressure, pulse, and oxygen                        saturations were monitored continuously. The                        Colonoscope was introduced through the anus and                        advanced to the the ileocolonic anastomosis. Findings:      Two sessile polyps were found in the anastomosis. The polyps were 5 to       10 mm in size. These polyps were  removed with a hot snare. Resection and       retrieval were complete.      Diverticulum noted near anastomosis on the colonic side. Scope advanced       into the distal ileum without finding.      The retroflexed view of the distal rectum and anal verge was normal and       showed no anal or rectal abnormalities. Impression:           - Two 5 to 10 mm polyps at the anastomosis, removed                        with a hot snare. Resected and retrieved.                       - The distal rectum and anal verge are normal on                        retroflexion view. Recommendation:       - Telephone endoscopist for pathology results in 1 week.                       - Repeat colonoscopy in 5 years for surveillance. Procedure Code(s):    --- Professional ---                       [id number], Colonoscopy, flexible; with removal of tumor(s),                        polyp(s), or other lesion(s) by snare technique Diagnosis Code(s):    --- Professional ---                       D12.6, Benign neoplasm of colon, unspecified                       T01.601, Personal history of other malignant neoplasm                        of large intestine CPT copyright 2016 American Medical Association. All rights reserved. The codes documented in this report are preliminary and upon coder review may  be revised to meet current compliance requirements. Robert Bellow, MD 10/06/2017 9:28:11 AM This report has been signed electronically. Number of Addenda: 0 Note Initiated On: 10/06/2017 8:45 AM Scope Withdrawal Time: 0 hours 16 minutes 47 seconds  Total Procedure Duration: 0 hours 19 minutes 15 seconds       [facility]

## 2017-10-06 NOTE — Anesthesia Post-op Follow-up Note (Signed)
Anesthesia QCDR form completed.        

## 2017-10-06 NOTE — Anesthesia Postprocedure Evaluation (Signed)
Anesthesia Post Note  Patient: MERIAH SHANDS  Procedure(s) Performed: COLONOSCOPY WITH PROPOFOL (N/A )  Patient location during evaluation: Endoscopy Anesthesia Type: General Level of consciousness: awake and alert Pain management: pain level controlled Vital Signs Assessment: post-procedure vital signs reviewed and stable Respiratory status: spontaneous breathing, nonlabored ventilation, respiratory function stable and patient connected to nasal cannula oxygen Cardiovascular status: blood pressure returned to baseline and stable Postop Assessment: no apparent nausea or vomiting Anesthetic complications: no     Last Vitals:  Vitals:   10/06/17 0936 10/06/17 0946  BP: 126/64 (!) 150/73  Pulse: 66 61  Resp: 14 15  Temp:    SpO2: 99% 99%    Last Pain:  Vitals:   10/06/17 0926  TempSrc: Tympanic                 Precious Haws Piscitello

## 2017-10-07 ENCOUNTER — Encounter: Payer: Self-pay | Admitting: General Surgery

## 2017-10-07 ENCOUNTER — Telehealth: Payer: Self-pay | Admitting: General Surgery

## 2017-10-07 LAB — SURGICAL PATHOLOGY

## 2017-10-07 NOTE — Telephone Encounter (Signed)
Message left for patient to call for report.  Focal high-grade dysplasia.  Polyps completely removed.  Will need to have a repeat exam in 1 year.

## 2017-10-15 ENCOUNTER — Telehealth: Payer: Self-pay | Admitting: *Deleted

## 2017-10-15 NOTE — Telephone Encounter (Signed)
Notified patient as instructed to  follow-up in one year. Pt in recalls.

## 2017-10-15 NOTE — Telephone Encounter (Signed)
-----   Message from Robert Bellow, MD sent at 10/15/2017  9:24 AM EST ----- Confirm patient got the message that she needs a follow-up exam in 1 year.  Thank you

## 2017-10-26 DIAGNOSIS — Z96652 Presence of left artificial knee joint: Secondary | ICD-10-CM | POA: Diagnosis not present

## 2017-12-29 ENCOUNTER — Other Ambulatory Visit: Payer: Self-pay

## 2017-12-29 DIAGNOSIS — D0502 Lobular carcinoma in situ of left breast: Secondary | ICD-10-CM

## 2018-01-03 ENCOUNTER — Encounter: Payer: Self-pay | Admitting: *Deleted

## 2018-01-26 ENCOUNTER — Ambulatory Visit: Payer: Medicare Other

## 2018-01-28 ENCOUNTER — Ambulatory Visit (INDEPENDENT_AMBULATORY_CARE_PROVIDER_SITE_OTHER): Payer: Medicare Other

## 2018-01-28 ENCOUNTER — Ambulatory Visit (INDEPENDENT_AMBULATORY_CARE_PROVIDER_SITE_OTHER): Payer: Medicare Other | Admitting: Family Medicine

## 2018-01-28 VITALS — BP 132/62 | HR 68 | Temp 99.0°F | Ht 66.0 in | Wt 227.2 lb

## 2018-01-28 DIAGNOSIS — D692 Other nonthrombocytopenic purpura: Secondary | ICD-10-CM | POA: Diagnosis not present

## 2018-01-28 DIAGNOSIS — I10 Essential (primary) hypertension: Secondary | ICD-10-CM | POA: Diagnosis not present

## 2018-01-28 DIAGNOSIS — Z0000 Encounter for general adult medical examination without abnormal findings: Secondary | ICD-10-CM

## 2018-01-28 DIAGNOSIS — R7303 Prediabetes: Secondary | ICD-10-CM

## 2018-01-28 DIAGNOSIS — E782 Mixed hyperlipidemia: Secondary | ICD-10-CM | POA: Diagnosis not present

## 2018-01-28 DIAGNOSIS — I679 Cerebrovascular disease, unspecified: Secondary | ICD-10-CM | POA: Diagnosis not present

## 2018-01-28 DIAGNOSIS — R42 Dizziness and giddiness: Secondary | ICD-10-CM

## 2018-01-28 MED ORDER — LOVASTATIN 40 MG PO TABS: 40.0000 mg | ORAL_TABLET | Freq: Every day | ORAL | 12 refills | Status: DC

## 2018-01-28 NOTE — Patient Instructions (Signed)
Lindsay Russo , Thank you for taking time to come for your Medicare Wellness Visit. I appreciate your ongoing commitment to your health goals. Please review the following plan we discussed and let me know if I can assist you in the future.   Screening recommendations/referrals: Colonoscopy: Up to date Mammogram: Up to date Bone Density: Up to date Recommended yearly ophthalmology/optometry visit for glaucoma screening and checkup Recommended yearly dental visit for hygiene and checkup  Vaccinations: Influenza vaccine: Up to date Pneumococcal vaccine: Up to date Tdap vaccine: Pt declines today.  Shingles vaccine: Pt declines today.     Advanced directives: Advance directive discussed with you today. Even though you declined this today please call our office should you change your mind and we can give you the proper paperwork for you to fill out.  Conditions/risks identified: Obesity- recommend decreasing amount of soda intake to 1 daily instead of 2-3 a day.  Next appointment: 9:00 AM today with Dr Caryn Section.   Preventive Care 72 Years and Older, Female Preventive care refers to lifestyle choices and visits with your health care provider that can promote health and wellness. What does preventive care include?  A yearly physical exam. This is also called an annual well check.  Dental exams once or twice a year.  Routine eye exams. Ask your health care provider how often you should have your eyes checked.  Personal lifestyle choices, including:  Daily care of your teeth and gums.  Regular physical activity.  Eating a healthy diet.  Avoiding tobacco and drug use.  Limiting alcohol use.  Practicing safe sex.  Taking low-dose aspirin every day.  Taking vitamin and mineral supplements as recommended by your health care provider. What happens during an annual well check? The services and screenings done by your health care provider during your annual well check will depend on  your age, overall health, lifestyle risk factors, and family history of disease. Counseling  Your health care provider may ask you questions about your:  Alcohol use.  Tobacco use.  Drug use.  Emotional well-being.  Home and relationship well-being.  Sexual activity.  Eating habits.  History of falls.  Memory and ability to understand (cognition).  Work and work Statistician.  Reproductive health. Screening  You may have the following tests or measurements:  Height, weight, and BMI.  Blood pressure.  Lipid and cholesterol levels. These may be checked every 5 years, or more frequently if you are over 19 years old.  Skin check.  Lung cancer screening. You may have this screening every year starting at age 24 if you have a 30-pack-year history of smoking and currently smoke or have quit within the past 15 years.  Fecal occult blood test (FOBT) of the stool. You may have this test every year starting at age 22.  Flexible sigmoidoscopy or colonoscopy. You may have a sigmoidoscopy every 5 years or a colonoscopy every 10 years starting at age 51.  Hepatitis C blood test.  Hepatitis B blood test.  Sexually transmitted disease (STD) testing.  Diabetes screening. This is done by checking your blood sugar (glucose) after you have not eaten for a while (fasting). You may have this done every 1-3 years.  Bone density scan. This is done to screen for osteoporosis. You may have this done starting at age 50.  Mammogram. This may be done every 1-2 years. Talk to your health care provider about how often you should have regular mammograms. Talk with your health care provider about  your test results, treatment options, and if necessary, the need for more tests. Vaccines  Your health care provider may recommend certain vaccines, such as:  Influenza vaccine. This is recommended every year.  Tetanus, diphtheria, and acellular pertussis (Tdap, Td) vaccine. You may need a Td booster  every 10 years.  Zoster vaccine. You may need this after age 18.  Pneumococcal 13-valent conjugate (PCV13) vaccine. One dose is recommended after age 28.  Pneumococcal polysaccharide (PPSV23) vaccine. One dose is recommended after age 62. Talk to your health care provider about which screenings and vaccines you need and how often you need them. This information is not intended to replace advice given to you by your health care provider. Make sure you discuss any questions you have with your health care provider. Document Released: 12/13/2015 Document Revised: 08/05/2016 Document Reviewed: 09/17/2015 Elsevier Interactive Patient Education  2017 [place] Prevention in the Home Falls can cause injuries. They can happen to people of all ages. There are many things you can do to make your home safe and to help prevent falls. What can I do on the outside of my home?  Regularly fix the edges of walkways and driveways and fix any cracks.  Remove anything that might make you trip as you walk through a door, such as a raised step or threshold.  Trim any bushes or trees on the path to your home.  Use bright outdoor lighting.  Clear any walking paths of anything that might make someone trip, such as rocks or tools.  Regularly check to see if handrails are loose or broken. Make sure that both sides of any steps have handrails.  Any raised decks and porches should have guardrails on the edges.  Have any leaves, snow, or ice cleared regularly.  Use sand or salt on walking paths during winter.  Clean up any spills in your garage right away. This includes oil or grease spills. What can I do in the bathroom?  Use night lights.  Install grab bars by the toilet and in the tub and shower. Do not use towel bars as grab bars.  Use non-skid mats or decals in the tub or shower.  If you need to sit down in the shower, use a plastic, non-slip stool.  Keep the floor dry. Clean up any  water that spills on the floor as soon as it happens.  Remove soap buildup in the tub or shower regularly.  Attach bath mats securely with double-sided non-slip rug tape.  Do not have throw rugs and other things on the floor that can make you trip. What can I do in the bedroom?  Use night lights.  Make sure that you have a light by your bed that is easy to reach.  Do not use any sheets or blankets that are too big for your bed. They should not hang down onto the floor.  Have a firm chair that has side arms. You can use this for support while you get dressed.  Do not have throw rugs and other things on the floor that can make you trip. What can I do in the kitchen?  Clean up any spills right away.  Avoid walking on wet floors.  Keep items that you use a lot in easy-to-reach places.  If you need to reach something above you, use a strong step stool that has a grab bar.  Keep electrical cords out of the way.  Do not use floor polish or wax  that makes floors slippery. If you must use wax, use non-skid floor wax.  Do not have throw rugs and other things on the floor that can make you trip. What can I do with my stairs?  Do not leave any items on the stairs.  Make sure that there are handrails on both sides of the stairs and use them. Fix handrails that are broken or loose. Make sure that handrails are as long as the stairways.  Check any carpeting to make sure that it is firmly attached to the stairs. Fix any carpet that is loose or worn.  Avoid having throw rugs at the top or bottom of the stairs. If you do have throw rugs, attach them to the floor with carpet tape.  Make sure that you have a light switch at the top of the stairs and the bottom of the stairs. If you do not have them, ask someone to add them for you. What else can I do to help prevent falls?  Wear shoes that:  Do not have high heels.  Have rubber bottoms.  Are comfortable and fit you well.  Are closed  at the toe. Do not wear sandals.  If you use a stepladder:  Make sure that it is fully opened. Do not climb a closed stepladder.  Make sure that both sides of the stepladder are locked into place.  Ask someone to hold it for you, if possible.  Clearly mark and make sure that you can see:  Any grab bars or handrails.  First and last steps.  Where the edge of each step is.  Use tools that help you move around (mobility aids) if they are needed. These include:  Canes.  Walkers.  Scooters.  Crutches.  Turn on the lights when you go into a dark area. Replace any light bulbs as soon as they burn out.  Set up your furniture so you have a clear path. Avoid moving your furniture around.  If any of your floors are uneven, fix them.  If there are any pets around you, be aware of where they are.  Review your medicines with your doctor. Some medicines can make you feel dizzy. This can increase your chance of falling. Ask your doctor what other things that you can do to help prevent falls. This information is not intended to replace advice given to you by your health care provider. Make sure you discuss any questions you have with your health care provider. Document Released: 09/12/2009 Document Revised: 04/23/2016 Document Reviewed: 12/21/2014 Elsevier Interactive Patient Education  2017 [employer].

## 2018-01-28 NOTE — Progress Notes (Signed)
Subjective:   Lindsay Russo is a 72 y.o. female who presents for an Initial Medicare Annual Wellness Visit.  Review of Systems    N/A  Cardiac Risk Factors include: advanced age (>47men, >51 women);obesity (BMI >30kg/m2);hypertension     Objective:    Today's Vitals   01/28/18 0823  BP: 132/62  Pulse: 68  Temp: 99 F (37.2 C)  TempSrc: Oral  Weight: 227 lb 3.2 oz (103.1 kg)  Height: 5\' 6"  (1.676 m)  PainSc: 0-No pain   Body mass index is 36.67 kg/m.  Advanced Directives 01/28/2018 10/26/2016 10/15/2016  Does Patient Have a Medical Advance Directive? No No No  Would patient like information on creating a medical advance directive? No - Patient declined No - Patient declined No - patient declined information    Current Medications (verified) Outpatient Encounter Medications as of 01/28/2018  Medication Sig  . acetaminophen (TYLENOL) 500 MG tablet Take 1,000 mg by mouth every 6 (six) hours as needed for moderate pain or headache.  [name] amLODipine (NORVASC) 10 MG tablet Take 1 tablet (10 mg total) by mouth daily.  . chlorthalidone (HYGROTON) 25 MG tablet Take 1 tablet (25 mg total) by mouth daily.  . Coenzyme Q10 (COQ10) 200 MG CAPS Take 1 capsule by mouth daily.  [name] lovastatin (MEVACOR) 40 MG tablet TAKE ONE TABLET BY MOUTH EVERY NIGHT AT BEDTIME  . meclizine (ANTIVERT) 25 MG tablet Take 1 tablet (25 mg total) by mouth 3 (three) times daily as needed for dizziness.  . promethazine (PHENERGAN) 25 MG tablet Take 1 tablet (25 mg total) by mouth every 8 (eight) hours as needed for nausea or vomiting.  . tamoxifen (NOLVADEX) 20 MG tablet TAKE ONE (1) TABLET BY MOUTH EVERY DAY   No facility-administered encounter medications on file as of 01/28/2018.     Allergies (verified) Tegaderm ag mesh [silver]; Amoxicillin; and Sulfa antibiotics   History: Past Medical History:  Diagnosis Date  . Arthritis   . History of kidney stones   . Hypertension   . LCIS September 05, 2013   Left  breast, ER 90%, PR 60%  . Malignant neoplasm of ascending colon [facility]) November 2007   T2, N0.5.2 cm low-grade adenocarcinoma. 0/28 nodes positive.  . Personal history of colonic polyps   . Spinal headache 1995   at [facility] spinal made me feel dead, had no control.  . Stroke ([place]) 09/17/2010   Right thalamic without residual effects   Past Surgical History:  Procedure Laterality Date  . APPENDECTOMY  1995  . BREAST BIOPSY Right August 07, 2010   Sclerotic fibroadenoma removed from the right breast at the 10:00 position  . BREAST BIOPSY Left 08/14/2013   left breast superior LCIS. per path report: Pleomorphic LCIS has similar behavior to DCIS.  [name] BREAST BIOPSY Left 08/14/2013   left breast superior fibroadenoma  . BREAST BIOPSY Left 08/23/2017   stereo bx with Dr. Bary Castilla of lower inner quad. Cylinder clip. path pending  . BREAST BIOPSY    . BREAST LUMPECTOMY Left 09/05/2013   clear margins  . BREAST SURGERY Left 09/05/13   LCIS, ER 90%, PR 60%, negative margins.  . COLONOSCOPY  August 03, 2012   8 mm tubulovillous adenoma removed from the transverse colon. Followup exam in 2018 plan.  . COLONOSCOPY WITH PROPOFOL N/A 10/06/2017   Procedure: COLONOSCOPY WITH PROPOFOL;  Surgeon: Robert Bellow, MD;  Location: [facility] ENDOSCOPY;  Service: Endoscopy;  Laterality: N/A;  . DOPPLER ECHOCARDIOGRAPHY  09/17/2010  EF 55%, Mild to moderate MR and mild to moderate TR  . HEEL SPUR EXCISION Left 2005  . JOINT REPLACEMENT Right 2012  . KNEE ARTHROPLASTY Left 10/26/2016   Procedure: COMPUTER ASSISTED TOTAL KNEE ARTHROPLASTY;  Surgeon: Dereck Leep, MD;  Location: [facility] ORS;  Service: Orthopedics;  Laterality: Left;  . LUMBAR DISC SURGERY  2009   L4-5 microdiscectomy Dr. Mauri Pole  . OVARIAN CYST REMOVAL  1980  . PARTIAL COLECTOMY Right 2007   Dr. Bary Castilla  . REPLACEMENT TOTAL KNEE Right 12/2010   Family History  Problem Relation Age of Onset  . Gastric cancer Father   . Brain cancer Father    . Hypertension Sister   . Heart disease Other   . Ulcers Other   . Epilepsy Other    Social History   Socioeconomic History  . Marital status: Married    Spouse name: None  . Number of children: 2  . Years of education: None  . Highest education level: Associate degree: academic program  Social Needs  . Financial resource strain: Not hard at all  . Food insecurity - worry: Never true  . Food insecurity - inability: Never true  . Transportation needs - medical: No  . Transportation needs - non-medical: No  Occupational History  . Occupation: Retired  Tobacco Use  . Smoking status: Never Smoker  . Smokeless tobacco: Never Used  Substance and Sexual Activity  . Alcohol use: No  . Drug use: No  . Sexual activity: None  Other Topics Concern  . None  Social History Narrative  . None    Tobacco Counseling Counseling given: Not Answered   Clinical Intake:  Pre-visit preparation completed: Yes  Pain : No/denies pain Pain Score: 0-No pain     Nutritional Status: BMI > 30  Obese Nutritional Risks: None Diabetes: No  How often do you need to have someone help you when you read instructions, pamphlets, or other written materials from your doctor or pharmacy?: 1 - Never  Interpreter Needed?: No  Information entered by :: [facility], LPN   Activities of Daily Living In your present state of health, do you have any difficulty performing the following activities: 01/28/2018  Hearing? N  Vision? N  Difficulty concentrating or making decisions? N  Walking or climbing stairs? N  Dressing or bathing? N  Doing errands, shopping? N  Preparing Food and eating ? N  Using the Toilet? N  In the past six months, have you accidently leaked urine? Y  Comment Occasionally with sneezing.  Do you have problems with loss of bowel control? N  Managing your Medications? N  Managing your Finances? N  Housekeeping or managing your Housekeeping? N  Some recent data might be hidden       Immunizations and Health Maintenance Immunization History  Administered Date(s) Administered  . Influenza, High Dose Seasonal PF 12/21/2016, 10/04/2017  . Pneumococcal Conjugate-13 11/12/2014  . Pneumococcal Polysaccharide-23 01/11/2012  . Tdap 04/06/2007   Health Maintenance Due  Topic Date Due  . TETANUS/TDAP  04/05/2017    Patient Care Team: Birdie Sons, MD as PCP - General (Family Medicine) Bary Castilla, Forest Gleason, MD (General Surgery) Anell Barr, OD as Consulting Physician (Optometry) Marry Guan, Laurice Record, MD as Consulting Physician (Orthopedic Surgery)  Indicate any recent Medical Services you may have received from other than [facility] providers in the past year (date may be approximate).     Assessment:   This is a routine wellness examination for Elliott.  Hearing/Vision screen Vision Screening Comments: Pt sees Dr Ellin Mayhew for vision checks every 1-2 years.   Dietary issues and exercise activities discussed: Current Exercise Habits: Home exercise routine, Time (Minutes): 30(to 45 minutes at a time), Frequency (Times/Week): 3, Weekly Exercise (Minutes/Week): 90, Exercise limited by: None identified  Goals    . DIET - DECREASE SODA OR JUICE INTAKE     Recommend decreasing amount of soda intake to 1 daily instead of 2-3 a day.      Depression Screen PHQ 2/9 Scores 01/28/2018 01/28/2018 01/25/2017 01/25/2017 11/25/2016 11/18/2015  PHQ - 2 Score 0 0 0 0 0 0  PHQ- 9 Score 2 - - - - -    Fall Risk Fall Risk  01/28/2018 01/25/2017 01/25/2017 11/25/2016 11/18/2015  Falls in the past year? No No No No No    Is the patient's home free of loose throw rugs in walkways, pet beds, electrical cords, etc?   yes      Grab bars in the bathroom? yes      Handrails on the stairs?   yes      Adequate lighting?   yes  Timed Get Up and Go Performed N/A  Cognitive Function: Pt declined screening today.      Screening Tests Health Maintenance  Topic Date Due  . TETANUS/TDAP   04/05/2017  . COLONOSCOPY  10/06/2018  . MAMMOGRAM  08/06/2019  . INFLUENZA VACCINE  Completed  . DEXA SCAN  Completed  . Hepatitis C Screening  Completed  . PNA vac Low Risk Adult  Completed    Qualifies for Shingles Vaccine? Pt declines today.   Cancer Screenings: Lung: Low Dose CT Chest recommended if Age 77-80 years, 30 pack-year currently smoking OR have quit w/in 15years. Patient does not qualify. Breast: Up to date on Mammogram? Yes   Up to date of Bone Density/Dexa? Yes Colorectal: Up to date  Additional Screenings:  Hepatitis C Screening: Up to date   Plan:  I have personally reviewed and addressed the Medicare Annual Wellness questionnaire and have noted the following in the patient's chart:  A. Medical and social history B. Use of alcohol, tobacco or illicit drugs  C. Current medications and supplements D. Functional ability and status E.  Nutritional status F.  Physical activity G. Advance directives H. List of other physicians I.  Hospitalizations, surgeries, and ER visits in previous 12 months J.  [place] such as hearing and vision if needed, cognitive and depression L. Referrals and appointments - none  In addition, I have reviewed and discussed with patient certain preventive protocols, quality metrics, and best practice recommendations. A written personalized care plan for preventive services as well as general preventive health recommendations were provided to patient.  See attached scanned questionnaire for additional information.   Signed,  Fabio Neighbors, LPN Nurse Health Advisor   Nurse Recommendations: None. Pt declined the tetanus vaccine today.

## 2018-01-28 NOTE — Progress Notes (Signed)
Patient: Lindsay Russo, Female    DOB: [birth year], 72 y.o.   MRN: [id number] Visit Date: 01/28/2018  Today's Provider: Lelon Huh, MD   Chief Complaint  Patient presents with  . Annual Exam  . Hypertension  . Hyperlipidemia   Subjective:   Patient saw McKenzie this morning at 8:30 for AWV.    Complete Physical Lindsay Russo is a 72 y.o. female. She feels well. She reports exercising not regularly. She reports she is sleeping well.  Colonoscopy-10/06/2017. 2 polyps removed. Repeat in 5 years. Mammogram- F/B Dr. Bary Castilla.  Pap- 11/18/2015. Normal.  Feels well with no complaints today.     Hypertension, follow-up:  BP Readings from Last 3 Encounters:  01/28/18 132/62  10/06/17 (!) 150/73  10/04/17 (!) 152/74    She was last seen for hypertension 4 months ago.  BP at that visit was 150/73. Management since that visit includes; advised to work on weight loss and improving diet.She reports good compliance with treatment. She is not having side effects.  She is not exercising. She is adherent to low salt diet.   Outside blood pressures are checked occasionally. She is experiencing none.  Patient denies exertional chest pressure/discomfort, lower extremity edema and palpitations.   Cardiovascular risk factors include dyslipidemia.     Lipid/Cholesterol, Follow-up:   Last seen for this 8 months ago.  Management since that visit includes; no changes.  Last Lipid Panel:    Component Value Date/Time   CHOL 180 01/25/2017 1101   TRIG 200 (H) 01/25/2017 1101   HDL 39 (L) 01/25/2017 1101   CHOLHDL 4.6 (H) 01/25/2017 1101   LDLCALC 101 (H) 01/25/2017 1101    She reports good compliance with treatment. She is not having side effects.   Wt Readings from Last 3 Encounters:  01/28/18 227 lb 3.2 oz (103.1 kg)  10/06/17 222 lb (100.7 kg)  10/04/17 225 lb (102.1 kg)    Pre-diabetes From 10/04/2017-Discussed diet as above. HgB A1C 6.4.  Review of Systems    Constitutional: Negative.   HENT: Negative.   Eyes: Negative.   Respiratory: Negative.   Cardiovascular: Negative.   Gastrointestinal: Negative.   Endocrine: Negative.   Genitourinary: Negative.   Musculoskeletal: Negative.   Skin: Negative.   Allergic/Immunologic: Negative.   Neurological: Negative.   Hematological: Negative.   Psychiatric/Behavioral: Negative.     Social History   Socioeconomic History  . Marital status: Married    Spouse name: Not on file  . Number of children: 2  . Years of education: Not on file  . Highest education level: Associate degree: academic program  Social Needs  . Financial resource strain: Not hard at all  . Food insecurity - worry: Never true  . Food insecurity - inability: Never true  . Transportation needs - medical: No  . Transportation needs - non-medical: No  Occupational History  . Occupation: Retired  Tobacco Use  . Smoking status: Never Smoker  . Smokeless tobacco: Never Used  Substance and Sexual Activity  . Alcohol use: No  . Drug use: No  . Sexual activity: Not on file  Other Topics Concern  . Not on file  Social History Narrative  . Not on file    Past Medical History:  Diagnosis Date  . Arthritis   . History of kidney stones   . Hypertension   . LCIS September 05, 2013   Left breast, ER 90%, PR 60%  . Malignant neoplasm of ascending  colon [facility]) November 2007   T2, N0.5.2 cm low-grade adenocarcinoma. 0/28 nodes positive.  . Personal history of colonic polyps   . Spinal headache 1995   at [facility] spinal made me feel dead, had no control.  . Stroke ([place]) 09/17/2010   Right thalamic without residual effects     Patient Active Problem List   Diagnosis Date Noted  . Breast calcification, left 08/16/2017  . S/P total knee arthroplasty 10/26/2016  . Allergic rhinitis 11/21/2015  . Personal history of urinary calculi 11/21/2015  . History of migraine 11/21/2015  . CN (constipation) 11/21/2015  . Arthritis  11/21/2015  . Vertigo 05/13/2015  . Senile purpura ([place]) 05/13/2015  . Cerebrovascular disease 03/26/2015  . Degenerative disc disease, lumbar 03/26/2015  . Obesity 03/26/2015  . Hyperlipidemia, mixed 03/26/2015  . Hypertension 03/26/2015  . Pre-diabetes 03/26/2015  . History of left breast cancer 08/21/2014  . Lobular carcinoma in situ 09/26/2013  . Breast neoplasm, Tis (LCIS) 09/12/2013  . Breast microcalcification, mammographic 07/26/2013  . History of CVA (cerebrovascular accident) 09/17/2010  . Malignant neoplasm of ascending colon ([place]) 09/30/2006  . History of colon cancer 09/30/2006  . DDD (degenerative disc disease), lumbar 11/30/1998    Past Surgical History:  Procedure Laterality Date  . APPENDECTOMY  1995  . BREAST BIOPSY Right August 07, 2010   Sclerotic fibroadenoma removed from the right breast at the 10:00 position  . BREAST BIOPSY Left 08/14/2013   left breast superior LCIS. per path report: Pleomorphic LCIS has similar behavior to DCIS.  [name] BREAST BIOPSY Left 08/14/2013   left breast superior fibroadenoma  . BREAST BIOPSY Left 08/23/2017   stereo bx with Dr. Bary Castilla of lower inner quad. Cylinder clip. path pending  . BREAST BIOPSY    . BREAST LUMPECTOMY Left 09/05/2013   clear margins  . BREAST SURGERY Left 09/05/13   LCIS, ER 90%, PR 60%, negative margins.  . COLONOSCOPY  August 03, 2012   8 mm tubulovillous adenoma removed from the transverse colon. Followup exam in 2018 plan.  . COLONOSCOPY WITH PROPOFOL N/A 10/06/2017   Procedure: COLONOSCOPY WITH PROPOFOL;  Surgeon: Robert Bellow, MD;  Location: [facility] ENDOSCOPY;  Service: Endoscopy;  Laterality: N/A;  . DOPPLER ECHOCARDIOGRAPHY  09/17/2010   EF 55%, Mild to moderate MR and mild to moderate TR  . HEEL SPUR EXCISION Left 2005  . JOINT REPLACEMENT Right 2012  . KNEE ARTHROPLASTY Left 10/26/2016   Procedure: COMPUTER ASSISTED TOTAL KNEE ARTHROPLASTY;  Surgeon: Dereck Leep, MD;  Location: [facility]  ORS;  Service: Orthopedics;  Laterality: Left;  . LUMBAR DISC SURGERY  2009   L4-5 microdiscectomy Dr. Mauri Pole  . OVARIAN CYST REMOVAL  1980  . PARTIAL COLECTOMY Right 2007   Dr. Bary Castilla  . REPLACEMENT TOTAL KNEE Right 12/2010    Her family history includes Brain cancer in her father; Epilepsy in her other; Gastric cancer in her father; Heart disease in her other; Hypertension in her sister; Ulcers in her other.      Current Outpatient Medications:  .  acetaminophen (TYLENOL) 500 MG tablet, Take 1,000 mg by mouth every 6 (six) hours as needed for moderate pain or headache., Disp: , Rfl:  .  amLODipine (NORVASC) 10 MG tablet, Take 1 tablet (10 mg total) by mouth daily., Disp: 90 tablet, Rfl: 4 .  chlorthalidone (HYGROTON) 25 MG tablet, Take 1 tablet (25 mg total) by mouth daily., Disp: 90 tablet, Rfl: 3 .  Coenzyme Q10 (COQ10) 200 MG CAPS, Take  1 capsule by mouth daily., Disp: , Rfl:  .  lovastatin (MEVACOR) 40 MG tablet, TAKE ONE TABLET BY MOUTH EVERY NIGHT AT BEDTIME, Disp: 90 tablet, Rfl: 4 .  meclizine (ANTIVERT) 25 MG tablet, Take 1 tablet (25 mg total) by mouth 3 (three) times daily as needed for dizziness., Disp: 30 tablet, Rfl: 0 .  promethazine (PHENERGAN) 25 MG tablet, Take 1 tablet (25 mg total) by mouth every 8 (eight) hours as needed for nausea or vomiting., Disp: 20 tablet, Rfl: 0 .  tamoxifen (NOLVADEX) 20 MG tablet, TAKE ONE (1) TABLET BY MOUTH EVERY DAY, Disp: 90 tablet, Rfl: 3  Patient Care Team: Birdie Sons, MD as PCP - General (Family Medicine) Bary Castilla, Forest Gleason, MD (General Surgery) Anell Barr, OD as Consulting Physician (Optometry) Hooten, Laurice Record, MD as Consulting Physician (Orthopedic Surgery)     Objective:   Vitals:  BP  132/62 (BP Location: Left Arm)     Pulse  68     Temp  99 F (37.2 C) (Oral)     Ht  5\' 6"  (1.676 m)     Wt  227 lb 3.2 oz (103.1 kg)      BMI  36.67 kg/m      Physical Exam   General Appearance:    Alert,  cooperative, no distress, appears stated age  Head:    Normocephalic, without obvious abnormality, atraumatic  Eyes:    PERRL, conjunctiva/corneas clear, EOM's intact, fundi    benign, both eyes  Ears:    Normal TM's and external ear canals, both ears  Nose:   Nares normal, septum midline, mucosa normal, no drainage    or sinus tenderness  Throat:   Lips, mucosa, and tongue normal; teeth and gums normal  Neck:   Supple, symmetrical, trachea midline, no adenopathy;    thyroid:  no enlargement/tenderness/nodules; no carotid   bruit or JVD  Back:     Symmetric, no curvature, ROM normal, no CVA tenderness  Lungs:     Clear to auscultation bilaterally, respirations unlabored  Chest Wall:    No tenderness or deformity   Heart:    Regular rate and rhythm, S1 and S2 normal, no murmur, rub   or gallop  Breast Exam:    deferred (Dr. Bary Castilla)  Abdomen:     Soft, non-tender, bowel sounds active all four quadrants,    no masses, no organomegaly  Pelvic:    deferred  Extremities:   Extremities normal, atraumatic, no cyanosis or edema. Scattered patches of purpora on UEs.   Pulses:   2+ and symmetric all extremities  Skin:   Skin color, texture, turgor normal, no rashes or lesions  Lymph nodes:   Cervical, supraclavicular, and axillary nodes normal  Neurologic:   CNII-XII intact, normal strength, sensation and reflexes    throughout    Activities of Daily Living In your present state of health, do you have any difficulty performing the following activities: 01/28/2018  Hearing? N  Vision? N  Difficulty concentrating or making decisions? N  Walking or climbing stairs? N  Dressing or bathing? N  Doing errands, shopping? N  Preparing Food and eating ? N  Using the Toilet? N  In the past six months, have you accidently leaked urine? Y  Comment Occasionally with sneezing.  Do you have problems with loss of bowel control? N  Managing your Medications? N  Managing your Finances? N  Housekeeping or  managing your Housekeeping? N  Some recent data  might be hidden    Fall Risk Assessment Fall Risk  01/28/2018 01/25/2017 01/25/2017 11/25/2016 11/18/2015  Falls in the past year? No No No No No     Depression Screen PHQ 2/9 Scores 01/28/2018 01/28/2018 01/25/2017 01/25/2017  PHQ - 2 Score 0 0 0 0  PHQ- 9 Score 2 - - -    Cognitive Testing - 6-CIT Cognitive Function: Pt declined screening today.    Assessment & Plan:    Annual Physical Reviewed patient's Family Medical History Reviewed and updated list of patient's medical providers Assessment of cognitive impairment was done Assessed patient's functional ability Established a written schedule for health screening [place] Completed and Reviewed  Exercise Activities and Dietary recommendations Goals    . DIET - DECREASE SODA OR JUICE INTAKE     Recommend decreasing amount of soda intake to 1 daily instead of 2-3 a day.       Immunization History  Administered Date(s) Administered  . Influenza, High Dose Seasonal PF 12/21/2016, 10/04/2017  . Pneumococcal Conjugate-13 11/12/2014  . Pneumococcal Polysaccharide-23 01/11/2012  . Tdap 04/06/2007    Health Maintenance  Topic Date Due  . TETANUS/TDAP  04/05/2017  . COLONOSCOPY  10/06/2018  . MAMMOGRAM  08/06/2019  . INFLUENZA VACCINE  Completed  . DEXA SCAN  Completed  . Hepatitis C Screening  Completed  . PNA vac Low Risk Adult  Completed     Discussed health benefits of physical activity, and encouraged her to engage in regular exercise appropriate for her age and condition.    1. Annual physical exam Doing well without complaint today.   2. Cerebrovascular disease Recommend she start taking 81mg  ECASA daily  3. Essential hypertension Well controlled.  Continue current medications.   - EKG 12-Lead  4. Senile purpura ([place]) If worsens on ASA advised she could take QOD  5. Hyperlipidemia, mixed She is tolerating lovastatin well with no  adverse effects.   - Comprehensive metabolic panel - Lipid panel - lovastatin (MEVACOR) 40 MG tablet; Take 1 tablet (40 mg total) by mouth at bedtime.  Dispense: 30 tablet; Refill: 12  6. Pre-diabetes Well controlled check a1c q6 months if stable.  - Hemoglobin A1c  7. Vertigo Rare flare up which respond well to meclizine. She states she still has plenty from last prescription.     Lelon Huh, MD  [facility]

## 2018-01-28 NOTE — Patient Instructions (Signed)
   Recommend taking 81mg enteric coated aspirin to reduce risk of vascular events such as heart attacks and strokes.    

## 2018-01-29 LAB — COMPREHENSIVE METABOLIC PANEL
ALBUMIN: 4.2 g/dL (ref 3.5–4.8)
ALK PHOS: 67 IU/L (ref 39–117)
ALT: 14 IU/L (ref 0–32)
AST: 24 IU/L (ref 0–40)
Albumin/Globulin Ratio: 1.4 (ref 1.2–2.2)
BUN/Creatinine Ratio: 16 (ref 12–28)
BUN: 17 mg/dL (ref 8–27)
Bilirubin Total: 0.3 mg/dL (ref 0.0–1.2)
CO2: 25 mmol/L (ref 20–29)
CREATININE: 1.09 mg/dL — AB (ref 0.57–1.00)
Calcium: 9.1 mg/dL (ref 8.7–10.3)
Chloride: 103 mmol/L (ref 96–106)
GFR calc Af Amer: 59 mL/min/{1.73_m2} — ABNORMAL LOW (ref >59)
GFR, EST NON AFRICAN AMERICAN: 51 mL/min/{1.73_m2} — AB (ref >59)
GLOBULIN, TOTAL: 2.9 g/dL (ref 1.5–4.5)
Glucose: 120 mg/dL — ABNORMAL HIGH (ref 65–99)
Potassium: 3.6 mmol/L (ref 3.5–5.2)
SODIUM: 147 mmol/L — AB (ref 134–144)
Total Protein: 7.1 g/dL (ref 6.0–8.5)

## 2018-01-29 LAB — LIPID PANEL
CHOL/HDL RATIO: 3.2 ratio (ref 0.0–4.4)
CHOLESTEROL TOTAL: 144 mg/dL (ref 100–199)
HDL: 45 mg/dL (ref >39)
LDL CALC: 58 mg/dL (ref 0–99)
TRIGLYCERIDES: 204 mg/dL — AB (ref 0–149)
VLDL CHOLESTEROL CAL: 41 mg/dL — AB (ref 5–40)

## 2018-01-29 LAB — HEMOGLOBIN A1C
Est. average glucose Bld gHb Est-mCnc: 134 mg/dL
Hgb A1c MFr Bld: 6.3 % — ABNORMAL HIGH (ref 4.8–5.6)

## 2018-01-31 ENCOUNTER — Encounter: Payer: Medicare Other | Admitting: Family Medicine

## 2018-01-31 ENCOUNTER — Other Ambulatory Visit: Payer: Self-pay | Admitting: *Deleted

## 2018-01-31 DIAGNOSIS — R42 Dizziness and giddiness: Secondary | ICD-10-CM

## 2018-01-31 MED ORDER — PROMETHAZINE HCL 25 MG PO TABS: 25.0000 mg | ORAL_TABLET | Freq: Three times a day (TID) | ORAL | 0 refills | Status: DC | PRN

## 2018-01-31 MED ORDER — MECLIZINE HCL 25 MG PO TABS
25.0000 mg | ORAL_TABLET | Freq: Three times a day (TID) | ORAL | 0 refills | Status: DC | PRN
Start: 2018-01-31 — End: 2019-04-18

## 2018-02-21 ENCOUNTER — Ambulatory Visit
Admission: RE | Admit: 2018-02-21 | Discharge: 2018-02-21 | Disposition: A | Discharge status: Home / Self Care | Payer: Medicare Other | Source: Ambulatory Visit | Attending: General Surgery | Admitting: General Surgery

## 2018-02-21 DIAGNOSIS — D0502 Lobular carcinoma in situ of left breast: Secondary | ICD-10-CM | POA: Diagnosis present

## 2018-02-21 DIAGNOSIS — R921 Mammographic calcification found on diagnostic imaging of breast: Secondary | ICD-10-CM | POA: Insufficient documentation

## 2018-02-21 DIAGNOSIS — R928 Other abnormal and inconclusive findings on diagnostic imaging of breast: Secondary | ICD-10-CM | POA: Insufficient documentation

## 2018-03-03 ENCOUNTER — Ambulatory Visit: Payer: Medicare Other | Admitting: General Surgery

## 2018-03-03 ENCOUNTER — Encounter: Payer: Self-pay | Admitting: General Surgery

## 2018-03-03 VITALS — BP 140/78 | HR 86 | Resp 16 | Ht 67.0 in | Wt 223.0 lb

## 2018-03-03 DIAGNOSIS — D0502 Lobular carcinoma in situ of left breast: Secondary | ICD-10-CM

## 2018-03-03 DIAGNOSIS — R921 Mammographic calcification found on diagnostic imaging of breast: Secondary | ICD-10-CM | POA: Diagnosis not present

## 2018-03-03 NOTE — Progress Notes (Signed)
Patient ID: Lindsay Russo, female   DOB: [birth year], 72 y.o.   MRN: [id number]  Chief Complaint  Patient presents with  . Follow-up    HPI BLESSINGS Lindsay Russo is a 72 y.o. female who presents for a breast evaluation. The most recent left breast  mammogram was done on 02/21/2018.  Patient does perform regular self breast checks and gets regular mammograms done.  Patient states she has been having trouble with the Tamoxifen for about six months now. She has been waking up in the middle of the night soaked with sweat.( night sweats. )   HPI  Past Medical History:  Diagnosis Date  . Arthritis   . History of kidney stones   . Hypertension   . LCIS September 05, 2013   Left breast, ER 90%, PR 60%  . Malignant neoplasm of ascending colon [facility]) November 2007   T2, N0.5.2 cm low-grade adenocarcinoma. 0/28 nodes positive.  . Personal history of colonic polyps   . Spinal headache 1995   at [facility] spinal made me feel dead, had no control.  . Stroke ([facility]) 09/17/2010   Right thalamic without residual effects    Past Surgical History:  Procedure Laterality Date  . APPENDECTOMY  1995  . BREAST BIOPSY Right August 07, 2010   Sclerotic fibroadenoma removed from the right breast at the 10:00 position  . BREAST BIOPSY Left 08/14/2013   left breast superior LCIS. per path report: Pleomorphic LCIS has similar behavior to DCIS.  [name] BREAST BIOPSY Left 08/14/2013   left breast superior fibroadenoma  . BREAST BIOPSY Left 08/23/2017   stereo bx with Dr. Lemar Livings of lower inner quad. FIBROADENOMATOID STROMAL FIBROSIS WITH ASSOCIATED CALCIFICATIONS.   [name] BREAST BIOPSY    . BREAST LUMPECTOMY Left 09/05/2013   clear margins  . BREAST SURGERY Left 09/05/13   LCIS, ER 90%, PR 60%, negative margins.  . COLONOSCOPY  August 03, 2012   8 mm tubulovillous adenoma removed from the transverse colon. Followup exam in 2018 plan.  . COLONOSCOPY WITH PROPOFOL N/A 10/06/2017   Procedure: COLONOSCOPY WITH PROPOFOL;   Surgeon: [name], MD;  Location: [facility] ENDOSCOPY;  Service: Endoscopy;  Laterality: N/A;  . DOPPLER ECHOCARDIOGRAPHY  09/17/2010   EF 55%, Mild to moderate MR and mild to moderate TR  . HEEL SPUR EXCISION Left 2005  . JOINT REPLACEMENT Right 2012  . KNEE ARTHROPLASTY Left 10/26/2016   Procedure: COMPUTER ASSISTED TOTAL KNEE ARTHROPLASTY;  Surgeon: Donato Heinz, MD;  Location: [facility] ORS;  Service: Orthopedics;  Laterality: Left;  . LUMBAR DISC SURGERY  2009   L4-5 microdiscectomy Dr. Gerrit Heck  . OVARIAN CYST REMOVAL  1980  . PARTIAL COLECTOMY Right 2007   Dr. Lemar Livings  . REPLACEMENT TOTAL KNEE Right 12/2010    Family History  Problem Relation Age of Onset  . Gastric cancer Father   . Brain cancer Father   . Hypertension Sister   . Heart disease Other   . Ulcers Other   . Epilepsy Other     Social History Social History   Tobacco Use  . Smoking status: Never Smoker  . Smokeless tobacco: Never Used  Substance Use Topics  . Alcohol use: No  . Drug use: No    Allergies  Allergen Reactions  . Tegaderm Ag Mesh [Silver] Other (See Comments)    blisters  . Amoxicillin Hives and Rash    Has patient had a PCN reaction causing immediate rash, facial/tongue/throat swelling, SOB or lightheadedness with hypotension:  Yes Has patient had a PCN reaction causing severe rash involving mucus membranes or skin necrosis: Yes Has patient had a PCN reaction that required hospitalization No Has patient had a PCN reaction occurring within the last 10 years: Yes If all of the above answers are "NO", then may proceed with Cephalosporin use.   . Sulfa Antibiotics Rash    Current Outpatient Medications  Medication Sig Dispense Refill  . acetaminophen (TYLENOL) 500 MG tablet Take 1,000 mg by mouth every 6 (six) hours as needed for moderate pain or headache.    [name] amLODipine (NORVASC) 10 MG tablet Take 1 tablet (10 mg total) by mouth daily. 90 tablet 4  . chlorthalidone (HYGROTON) 25 MG  tablet Take 1 tablet (25 mg total) by mouth daily. 90 tablet 3  . Coenzyme Q10 (COQ10) 200 MG CAPS Take 1 capsule by mouth daily.    [name] lovastatin (MEVACOR) 40 MG tablet Take 1 tablet (40 mg total) by mouth at bedtime. 30 tablet 12  . meclizine (ANTIVERT) 25 MG tablet Take 1 tablet (25 mg total) by mouth 3 (three) times daily as needed for dizziness. 30 tablet 0  . promethazine (PHENERGAN) 25 MG tablet Take 1 tablet (25 mg total) by mouth every 8 (eight) hours as needed for nausea or vomiting. 20 tablet 0  . tamoxifen (NOLVADEX) 20 MG tablet TAKE ONE (1) TABLET BY MOUTH EVERY DAY 90 tablet 3   No current facility-administered medications for this visit.     Review of Systems Review of Systems  Constitutional: Negative.   Respiratory: Negative.   Cardiovascular: Negative.     Blood pressure 140/78, pulse 86, resp. rate 16, height 5\' 7"  (1.702 m), weight 223 lb (101.2 kg).  Physical Exam Physical Exam  Constitutional: She is oriented to person, place, and time. She appears well-developed and well-nourished.  Eyes: Conjunctivae are normal. No scleral icterus.  Neck: Neck supple.  Cardiovascular: Normal rate, regular rhythm and normal heart sounds.  Pulmonary/Chest: Effort normal and breath sounds normal. Right breast exhibits no inverted nipple, no mass, no nipple discharge, no skin change and no tenderness. Left breast exhibits no inverted nipple, no mass, no nipple discharge, no skin change and no tenderness.  Abdominal: Soft. Normal appearance and bowel sounds are normal. There is no tenderness. No hernia.  Lymphadenopathy:    She has no cervical adenopathy.  Neurological: She is alert and oriented to person, place, and time.  Skin: Skin is warm and dry.    Data Reviewed August 23, 2017 stereotactic biopsy of a cluster of calcifications in the upper outer quadrant of the left breast: DIAGNOSIS:  A. LEFT BREAST, LOWER INNER QUADRANT; STEREOTACTIC BIOPSY:  - FIBROADENOMATOID  STROMAL FIBROSIS WITH ASSOCIATED CALCIFICATIONS.  - NEGATIVE FOR ATYPIA AND MALIGNANCY.   February 21, 2018 left breast diagnostic mammogram: The 5 x 3 mm group of coarse heterogeneous calcifications within the posterior aspect of the lower inner quadrant of the left breast, seen on 08/05/2017 and recommended for stereotactic guided core needle biopsy, are again demonstrated. These are unchanged. These are located 6.6 cm medial, posterior and inferior to the cylinder shaped biopsy marker clip placed at the time of stereotactic biopsy on 08/24/2017. There is no significant change in additional benign-appearing calcifications elsewhere in the left breast.  The August 05, 2017 mammograms were the first at the [place] facility.    Assessment    Stable cluster of microcalcifications in the upper inner quadrant of the left breast.    Plan  Discussed option with patient to have 1) a left breast biopsy at this time or oral 2) follow up with bilateral diagnotic mammogram in six months.   Patient to follow up in six months with diagnotic mammogram. The patient is aware to call back for any questions or concerns.  The patient has had no major medication changes except the addition of [place] for blood pressure management, about the same time she noticed the increased night sweats.  She will hold her diuretic for 1 week and see if this has any impact on her symptoms.  She is been asked to give a phone follow-up in this regard.  If she does not show symptomatic improvement will likely consider an early termination of her tamoxifen, although the potential for the substitution of an aromatase inhibitor was touched on.  Price may be prohibitive even for generic Femara.  We can consider a trial of Effexor if the above 2 manipulations are unsuccessful.    HPI, Physical Exam, Assessment and Plan have been scribed under the direction and in the presence of Donnalee Curry, MD.  Ples Specter,  CMA  I have completed the exam and reviewed the above documentation for accuracy and completeness.  I agree with the above.  Museum/gallery conservator has been used and any errors in dictation or transcription are unintentional.  Donnalee Curry, M.D., F.A.C.S.     Merrily Pew Connor Meacham 03/04/2018, 2:52 PM

## 2018-03-03 NOTE — Patient Instructions (Signed)
Discuss option with patient to have a left breast biopsy or follow up with bilateral diagnotic mammogram in six months. Patient to follow up in six months with diagnotic mammogram. The patient is aware to call back for any questions or concerns.

## 2018-03-09 ENCOUNTER — Telehealth: Payer: Self-pay | Admitting: *Deleted

## 2018-03-09 NOTE — Telephone Encounter (Signed)
-----   Message from Robert Bellow, MD sent at 03/04/2018  2:58 PM EDT ----- Give the patient a call on April 11 and see if she had discontinued her fluid pill or the tamoxifen to see if this would relieve her night sweats.

## 2018-03-09 NOTE — Telephone Encounter (Signed)
She states there was a misunderstanding, but did not stop any medications. Dr Bary Castilla aware and so she will stop the "fluid pill" for a week and we will call her next week to check on the status of the night sweates. Appreciates call.

## 2018-03-16 ENCOUNTER — Telehealth: Payer: Self-pay | Admitting: *Deleted

## 2018-03-16 NOTE — Telephone Encounter (Signed)
She does feel like the hot flashes has improved being off of the medication, she states  "back to what they were before" and manageable. She states she will discuss with Dr Caryn Section to see if there is anything else she can take instead. Appreciates call.

## 2018-03-16 NOTE — Telephone Encounter (Signed)
-----   Message from Carson Myrtle, RN sent at 03/09/2018  4:32 PM EDT ----- Check on the status of her "night sweats" since she has stopped the "fluid pill" medication.

## 2018-03-17 ENCOUNTER — Other Ambulatory Visit: Payer: Self-pay | Admitting: Family Medicine

## 2018-03-17 DIAGNOSIS — I10 Essential (primary) hypertension: Secondary | ICD-10-CM

## 2018-04-26 ENCOUNTER — Other Ambulatory Visit: Payer: Self-pay

## 2018-04-26 NOTE — Patient Outreach (Signed)
[place] [facility]) Care Management  04/26/2018  DESHANNON SEIDE 05/17/1946 [id number]   Medication Adherence call to Mrs. Tenita Cue patient did not answer she is past due on Lovastatin 40 mg patient telephone number under [facility] is disconnected her new number [phone]. [place] said last time she pick up was in April/2019. Mrs. Pepin is showing past due under [facility] Ins.for Medication Adherence call.   [place] Management Direct Dial [phone]  Fax [phone] [email or web address]

## 2018-05-23 ENCOUNTER — Other Ambulatory Visit: Payer: Self-pay

## 2018-05-23 NOTE — Patient Outreach (Signed)
[facility] [facility]) Care Management  05/23/2018  CHAYCE RULLO 03-20-1946 [id number]   Medication Adherence call to Mrs. Kema Santaella spoke with patient she said they just switch pharmacy and have some medication from before she still have medication until the end of the month Mrs. Renaldo said she will order this medication her self.patient is showing past due under [place].  [place] Management Direct Dial [phone]  Fax [phone] [email or web address]

## 2018-05-24 ENCOUNTER — Other Ambulatory Visit: Payer: Self-pay | Admitting: Family Medicine

## 2018-05-24 DIAGNOSIS — I10 Essential (primary) hypertension: Secondary | ICD-10-CM

## 2018-05-24 DIAGNOSIS — R601 Generalized edema: Secondary | ICD-10-CM

## 2018-06-17 ENCOUNTER — Ambulatory Visit: Payer: Medicare Other | Admitting: Family Medicine

## 2018-07-06 ENCOUNTER — Other Ambulatory Visit: Payer: Self-pay

## 2018-07-06 DIAGNOSIS — D0502 Lobular carcinoma in situ of left breast: Secondary | ICD-10-CM

## 2018-07-25 ENCOUNTER — Ambulatory Visit (INDEPENDENT_AMBULATORY_CARE_PROVIDER_SITE_OTHER): Payer: Medicare Other | Admitting: Family Medicine

## 2018-07-25 ENCOUNTER — Encounter: Payer: Self-pay | Admitting: Family Medicine

## 2018-07-25 VITALS — BP 160/76 | HR 67 | Temp 98.9°F | Resp 18 | Wt 222.0 lb

## 2018-07-25 DIAGNOSIS — I10 Essential (primary) hypertension: Secondary | ICD-10-CM

## 2018-07-25 DIAGNOSIS — R351 Nocturia: Secondary | ICD-10-CM | POA: Diagnosis not present

## 2018-07-25 DIAGNOSIS — G2581 Restless legs syndrome: Secondary | ICD-10-CM | POA: Diagnosis not present

## 2018-07-25 LAB — POCT URINALYSIS DIPSTICK
BILIRUBIN UA: NEGATIVE
Blood, UA: NEGATIVE
Glucose, UA: NEGATIVE
KETONES UA: NEGATIVE
Leukocytes, UA: NEGATIVE
NITRITE UA: NEGATIVE
PH UA: 6 (ref 5.0–8.0)
PROTEIN UA: POSITIVE — AB
Spec Grav, UA: 1.02 (ref 1.010–1.025)
UROBILINOGEN UA: 0.2 U/dL

## 2018-07-25 NOTE — Progress Notes (Signed)
Patient: Lindsay Russo Female    DOB: 10/11/1946   72 y.o.   MRN: [id number] Visit Date: 07/25/2018  Today's Provider: Lelon Huh, MD   Chief Complaint  Patient presents with  . Leg Pain    x 2 months   Subjective:    Leg Pain   There was no injury mechanism. Pain location: both legs. Quality: pin needles, cramps and spasms. The pain has been intermittent (only occurs at night when laying down) since onset. Associated symptoms include numbness and tingling.  Patient states theses symptoms are keeping her from sleeping at night. They usually occur shortly after falling asleep. They resolve when she moves her legs or gets out of bed. It is not really painful, just bothersome. No pain of numbness when walking. Has been having some nocturia, but not sure if that is keeping her or if she has to go because she can't sleep.      Allergies  Allergen Reactions  . Tegaderm Ag Mesh [Silver] Other (See Comments)    blisters  . Amoxicillin Hives and Rash    Has patient had a PCN reaction causing immediate rash, facial/tongue/throat swelling, SOB or lightheadedness with hypotension: Yes Has patient had a PCN reaction causing severe rash involving mucus membranes or skin necrosis: Yes Has patient had a PCN reaction that required hospitalization No Has patient had a PCN reaction occurring within the last 10 years: Yes If all of the above answers are "NO", then may proceed with Cephalosporin use.   . Sulfa Antibiotics Rash     Current Outpatient Medications:  .  acetaminophen (TYLENOL) 500 MG tablet, Take 1,000 mg by mouth every 6 (six) hours as needed for moderate pain or headache., Disp: , Rfl:  .  amLODipine (NORVASC) 10 MG tablet, TAKE 1 TABLET BY MOUTH ONCE DAILY, Disp: 90 tablet, Rfl: 4 .  chlorthalidone (HYGROTON) 25 MG tablet, TAKE 1 TABLET BY MOUTH ONCE DAILY, Disp: 90 tablet, Rfl: 4 .  Coenzyme Q10 (COQ10) 200 MG CAPS, Take 1 capsule by mouth daily., Disp: , Rfl:  .   lovastatin (MEVACOR) 40 MG tablet, Take 1 tablet (40 mg total) by mouth at bedtime., Disp: 30 tablet, Rfl: 12 .  meclizine (ANTIVERT) 25 MG tablet, Take 1 tablet (25 mg total) by mouth 3 (three) times daily as needed for dizziness., Disp: 30 tablet, Rfl: 0 .  promethazine (PHENERGAN) 25 MG tablet, Take 1 tablet (25 mg total) by mouth every 8 (eight) hours as needed for nausea or vomiting., Disp: 20 tablet, Rfl: 0 .  tamoxifen (NOLVADEX) 20 MG tablet, TAKE ONE (1) TABLET BY MOUTH EVERY DAY (Patient not taking: Reported on 07/25/2018), Disp: 90 tablet, Rfl: 3  Review of Systems  Constitutional: Negative for appetite change, chills, fatigue and fever.  Respiratory: Negative for chest tightness and shortness of breath.   Cardiovascular: Negative for chest pain and palpitations.  Gastrointestinal: Negative for abdominal pain, nausea and vomiting.  Endocrine: Positive for polyuria (at night).  Musculoskeletal: Positive for myalgias (in both legs).  Neurological: Positive for tingling and numbness. Negative for dizziness and weakness.    Social History   Tobacco Use  . Smoking status: Never Smoker  . Smokeless tobacco: Never Used  Substance Use Topics  . Alcohol use: No   Objective:   BP (!) 160/76 (BP Location: Right Arm, Cuff Size: Large)   Pulse 67   Temp 98.9 F (37.2 C) (Oral)   Resp 18   Wt  222 lb (100.7 kg)   SpO2 96% Comment: room air  BMI 34.77 kg/m  Vitals:   07/25/18 1054 07/25/18 1058  BP: (!) 161/77 (!) 160/76  Pulse: 67   Resp: 18   Temp: 98.9 F (37.2 C)   TempSrc: Oral   SpO2: 96%   Weight: 222 lb (100.7 kg)      Physical Exam   General appearance: alert, well developed, well nourished, cooperative and in no distress Head: Normocephalic, without obvious abnormality, atraumatic Respiratory: Respirations even and unlabored, normal respiratory rate Extremities: No gross deformities Skin: Skin color, texture, turgor normal. No rashes seen  Psych: Appropriate  mood and affect. Neurologic: Mental status: Alert, oriented to person, place, and time, thought content appropriate.     Assessment & Plan:     1. Restless leg syndrome She request prescription for ropinirole. If labs normal will send in prescription.  - Comprehensive metabolic panel - TSH - Magnesium - Ferritin  2. Essential hypertension BP up a bit today. Continue current medications.  If labs normal will refill chlorthalidone.  - Comprehensive metabolic panel - TSH - Magnesium - Ferritin  3. Nocturia  - POCT Urinalysis Dipstick       Lelon Huh, MD  [facility]

## 2018-07-26 ENCOUNTER — Telehealth: Payer: Self-pay | Admitting: *Deleted

## 2018-07-26 DIAGNOSIS — I10 Essential (primary) hypertension: Secondary | ICD-10-CM

## 2018-07-26 DIAGNOSIS — R601 Generalized edema: Secondary | ICD-10-CM

## 2018-07-26 LAB — TSH: TSH: 2.97 u[IU]/mL (ref 0.450–4.500)

## 2018-07-26 LAB — COMPREHENSIVE METABOLIC PANEL WITH GFR
ALT: 22 IU/L (ref 0–32)
AST: 39 IU/L (ref 0–40)
Albumin/Globulin Ratio: 1.5 (ref 1.2–2.2)
Albumin: 4.1 g/dL (ref 3.5–4.8)
Alkaline Phosphatase: 64 IU/L (ref 39–117)
BUN/Creatinine Ratio: 15 (ref 12–28)
BUN: 15 mg/dL (ref 8–27)
Bilirubin Total: 0.3 mg/dL (ref 0.0–1.2)
CO2: 26 mmol/L (ref 20–29)
Calcium: 9.8 mg/dL (ref 8.7–10.3)
Chloride: 103 mmol/L (ref 96–106)
Creatinine, Ser: 1.02 mg/dL — ABNORMAL HIGH (ref 0.57–1.00)
GFR calc Af Amer: 64 mL/min/1.73 (ref >59)
GFR calc non Af Amer: 55 mL/min/1.73 — ABNORMAL LOW (ref >59)
Globulin, Total: 2.7 g/dL (ref 1.5–4.5)
Glucose: 105 mg/dL — ABNORMAL HIGH (ref 65–99)
Potassium: 3.9 mmol/L (ref 3.5–5.2)
Sodium: 144 mmol/L (ref 134–144)
Total Protein: 6.8 g/dL (ref 6.0–8.5)

## 2018-07-26 LAB — MAGNESIUM: Magnesium: 1.8 mg/dL (ref 1.6–2.3)

## 2018-07-26 LAB — FERRITIN: Ferritin: 396 ng/mL — ABNORMAL HIGH (ref 15–150)

## 2018-07-26 MED ORDER — CHLORTHALIDONE 25 MG PO TABS: 25.0000 mg | ORAL_TABLET | Freq: Every day | ORAL | 4 refills | Status: DC

## 2018-07-26 MED ORDER — ROPINIROLE HCL 0.5 MG PO TABS: ORAL_TABLET | ORAL | 1 refills | Status: DC

## 2018-07-26 NOTE — Telephone Encounter (Signed)
Continue chlorthalidone. Refill has been sent to [facility]. Prescription for ropinirole has also been sent to [facility].

## 2018-07-26 NOTE — Telephone Encounter (Signed)
Patient was notified of results. Expressed understanding. Patient wanted to know what was decided concerning her chlorthalidone? Also please advise ropinirole rx?

## 2018-07-26 NOTE — Telephone Encounter (Signed)
Patient was advised. Patient stated she will call back to schedule 1 month follow up.

## 2018-07-26 NOTE — Telephone Encounter (Signed)
-----   Message from Birdie Sons, MD sent at 07/26/2018  1:34 PM EDT ----- Labs are all normal. Can go ahead and start ropinirole that was prescribed yesterday for restless legs. Follow up in a month.

## 2018-08-08 ENCOUNTER — Encounter: Payer: Self-pay | Admitting: Family Medicine

## 2018-08-08 ENCOUNTER — Ambulatory Visit (INDEPENDENT_AMBULATORY_CARE_PROVIDER_SITE_OTHER): Payer: Medicare Other | Admitting: Family Medicine

## 2018-08-08 DIAGNOSIS — G2581 Restless legs syndrome: Secondary | ICD-10-CM | POA: Insufficient documentation

## 2018-08-08 DIAGNOSIS — T7840XA Allergy, unspecified, initial encounter: Secondary | ICD-10-CM

## 2018-08-08 DIAGNOSIS — R21 Rash and other nonspecific skin eruption: Secondary | ICD-10-CM

## 2018-08-08 MED ORDER — PREDNISONE 20 MG PO TABS: 20.0000 mg | ORAL_TABLET | Freq: Two times a day (BID) | ORAL | 0 refills | Status: AC

## 2018-08-08 NOTE — Progress Notes (Signed)
Patient: Lindsay Russo Female    DOB: [birth year]   72 y.o.   MRN: [id number] Visit Date: 08/08/2018  Today's Provider: Lelon Huh, MD   Chief Complaint  Patient presents with  . Rash   Subjective:    Rash  This is a new problem. The current episode started yesterday. The affected locations include the face, neck, abdomen, left ear, right arm and right wrist. The rash is characterized by itchiness, blistering and redness. She was exposed to nothing. Associated symptoms include fatigue. Pertinent negatives include no anorexia, congestion, cough, diarrhea, eye pain, facial edema, fever, joint pain, nail changes, rhinorrhea, shortness of breath, sore throat or vomiting. Treatments tried: gold bold power. The treatment provided no relief.    Patient has rash on abdomen, face, neck and arms since yesterday. Rash is red and itchy. Patient states she has not used any new soaps or potions. She has not eaten any new foods. Patient has used power bond powder for rash on abdomen with no relief. She did start ropinirole for restless legs a few weeks ago, but stopped 5 days ago because it was making her nauseated.      Allergies  Allergen Reactions  . Tegaderm Ag Mesh [Silver] Other (See Comments)    blisters  . Amoxicillin Hives and Rash    Has patient had a PCN reaction causing immediate rash, facial/tongue/throat swelling, SOB or lightheadedness with hypotension: Yes Has patient had a PCN reaction causing severe rash involving mucus membranes or skin necrosis: Yes Has patient had a PCN reaction that required hospitalization No Has patient had a PCN reaction occurring within the last 10 years: Yes If all of the above answers are "NO", then may proceed with Cephalosporin use.   . Sulfa Antibiotics Rash     Current Outpatient Medications:  .  acetaminophen (TYLENOL) 500 MG tablet, Take 1,000 mg by mouth every 6 (six) hours as needed for moderate pain or headache., Disp: , Rfl:  .   amLODipine (NORVASC) 10 MG tablet, TAKE 1 TABLET BY MOUTH ONCE DAILY, Disp: 90 tablet, Rfl: 4 .  chlorthalidone (HYGROTON) 25 MG tablet, Take 1 tablet (25 mg total) by mouth daily., Disp: 90 tablet, Rfl: 4 .  Coenzyme Q10 (COQ10) 200 MG CAPS, Take 1 capsule by mouth daily., Disp: , Rfl:  .  lovastatin (MEVACOR) 40 MG tablet, Take 1 tablet (40 mg total) by mouth at bedtime., Disp: 30 tablet, Rfl: 12 .  meclizine (ANTIVERT) 25 MG tablet, Take 1 tablet (25 mg total) by mouth 3 (three) times daily as needed for dizziness., Disp: 30 tablet, Rfl: 0 .  promethazine (PHENERGAN) 25 MG tablet, Take 1 tablet (25 mg total) by mouth every 8 (eight) hours as needed for nausea or vomiting., Disp: 20 tablet, Rfl: 0 .  rOPINIRole (REQUIP) 0.5 MG tablet, Take 1/2 tablet at bedtime for two days, then increase to 0.5mg  x 5 days, then increase to 2 tablets at bedtime., Disp: 60 tablet, Rfl: 1 (NOT TAKING) .  tamoxifen (NOLVADEX) 20 MG tablet, TAKE ONE (1) TABLET BY MOUTH EVERY DAY, Disp: 90 tablet, Rfl: 3  Review of Systems  Constitutional: Positive for fatigue. Negative for appetite change, chills and fever.  HENT: Negative for congestion, rhinorrhea and sore throat.   Eyes: Negative for pain.  Respiratory: Negative for cough, chest tightness and shortness of breath.   Cardiovascular: Negative for chest pain and palpitations.  Gastrointestinal: Negative for abdominal pain, anorexia, diarrhea, nausea and vomiting.  Musculoskeletal: Negative for joint pain.  Skin: Positive for rash. Negative for nail changes.  Neurological: Negative for dizziness and weakness.    Social History   Tobacco Use  . Smoking status: Never Smoker  . Smokeless tobacco: Never Used  Substance Use Topics  . Alcohol use: No   Objective:   BP 133/79 (BP Location: Left Arm, Patient Position: Sitting, Cuff Size: Large)   Pulse 77   Resp 16   Wt 222 lb (100.7 kg)   SpO2 96%   BMI 34.77 kg/m  Vitals:   08/08/18 1635  BP: 133/79    Pulse: 77  Resp: 16  SpO2: 96%  Weight: 222 lb (100.7 kg)     Physical Exam  Confluent area of small macular, red blanching lesion on each side of upper abdomen with a few excoriations. Similar, but scattered lesions on both forearms and neck. No swelling.     Assessment & Plan:     1. Rash due to allergy Suspect delayed hypersensitivity reaction to ropinirole, which she already discontinued. Put on short course of prednisone and call if not rapidly improving. .   2. Restless leg syndrome Intolerant to ropinirole. She can call back for prescription Mirapex once current rash clears.        Lelon Huh, MD  [facility]

## 2018-08-12 ENCOUNTER — Other Ambulatory Visit: Payer: Medicare Other

## 2018-08-22 ENCOUNTER — Ambulatory Visit
Admission: RE | Admit: 2018-08-22 | Discharge: 2018-08-22 | Disposition: A | Discharge status: Home / Self Care | Payer: Medicare Other | Source: Ambulatory Visit | Attending: General Surgery | Admitting: General Surgery

## 2018-08-22 DIAGNOSIS — R921 Mammographic calcification found on diagnostic imaging of breast: Secondary | ICD-10-CM | POA: Diagnosis not present

## 2018-08-22 DIAGNOSIS — D0502 Lobular carcinoma in situ of left breast: Secondary | ICD-10-CM | POA: Insufficient documentation

## 2018-09-13 ENCOUNTER — Encounter: Payer: Self-pay | Admitting: General Surgery

## 2018-09-13 ENCOUNTER — Ambulatory Visit: Payer: Medicare Other | Admitting: General Surgery

## 2018-09-13 VITALS — BP 162/79 | HR 91 | Resp 14 | Ht 66.0 in | Wt 219.0 lb

## 2018-09-13 DIAGNOSIS — D0512 Intraductal carcinoma in situ of left breast: Secondary | ICD-10-CM

## 2018-09-13 DIAGNOSIS — D126 Benign neoplasm of colon, unspecified: Secondary | ICD-10-CM | POA: Diagnosis not present

## 2018-09-13 NOTE — Progress Notes (Signed)
Patient ID: Lindsay Russo, female   DOB: May 01, 1946, 72 y.o.   MRN: [id number]  Chief Complaint  Patient presents with  . Follow-up    HPI Lindsay Russo is a 72 y.o. female who presents for a breast evaluation. The most recent mammogram was done on 08/17/2018. Discuss colonoscopy. Last colonoscopy 10/06/2017.[name]  Patient does perform regular self breast checks and gets regular mammograms done.    HPI  Past Medical History:  Diagnosis Date  . Arthritis   . History of kidney stones   . Hypertension   . LCIS September 05, 2013   Left breast, ER 90%, PR 60%  . Malignant neoplasm of ascending colon [facility]) November 2007   T2, N0.5.2 cm low-grade adenocarcinoma. 0/28 nodes positive.  . Personal history of colonic polyps   . Spinal headache 1995   at [facility] spinal made me feel dead, had no control.  . Stroke ([facility]) 09/17/2010   Right thalamic without residual effects    Past Surgical History:  Procedure Laterality Date  . APPENDECTOMY  1995  . BREAST BIOPSY Right August 07, 2010   Sclerotic fibroadenoma removed from the right breast at the 10:00 position  . BREAST BIOPSY Left 08/14/2013   left breast superior LCIS. per path report: Pleomorphic LCIS has similar behavior to DCIS.  [name] BREAST BIOPSY Left 08/14/2013   left breast superior fibroadenoma  . BREAST BIOPSY Left 08/23/2017   stereo bx with Dr. Lemar Livings of lower inner quad. FIBROADENOMATOID STROMAL FIBROSIS WITH ASSOCIATED CALCIFICATIONS.   [name] BREAST BIOPSY    . BREAST LUMPECTOMY Left 09/05/2013   clear margins  . BREAST SURGERY Left 09/05/13   LCIS, ER 90%, PR 60%, negative margins.  . COLONOSCOPY  August 03, 2012   8 mm tubulovillous adenoma removed from the transverse colon. Followup exam in 2018 plan.  . COLONOSCOPY WITH PROPOFOL N/A 10/06/2017   Procedure: COLONOSCOPY WITH PROPOFOL;  Surgeon: [name], MD;  Location: [facility] ENDOSCOPY;  Service: Endoscopy;  Laterality: N/A;  . DOPPLER ECHOCARDIOGRAPHY  09/17/2010    EF 55%, Mild to moderate MR and mild to moderate TR  . HEEL SPUR EXCISION Left 2005  . JOINT REPLACEMENT Right 2012  . KNEE ARTHROPLASTY Left 10/26/2016   Procedure: COMPUTER ASSISTED TOTAL KNEE ARTHROPLASTY;  Surgeon: Donato Heinz, MD;  Location: [facility] ORS;  Service: Orthopedics;  Laterality: Left;  . LUMBAR DISC SURGERY  2009   L4-5 microdiscectomy Dr. Gerrit Heck  . OVARIAN CYST REMOVAL  1980  . PARTIAL COLECTOMY Right 2007   Dr. Lemar Livings  . REPLACEMENT TOTAL KNEE Right 12/2010    Family History  Problem Relation Age of Onset  . Gastric cancer Father   . Brain cancer Father   . Hypertension Sister   . Heart disease Other   . Ulcers Other   . Epilepsy Other     Social History Social History   Tobacco Use  . Smoking status: Never Smoker  . Smokeless tobacco: Never Used  Substance Use Topics  . Alcohol use: No  . Drug use: No    Allergies  Allergen Reactions  . Tegaderm Ag Mesh [Silver] Other (See Comments)    blisters  . Amoxicillin Hives and Rash    Has patient had a PCN reaction causing immediate rash, facial/tongue/throat swelling, SOB or lightheadedness with hypotension: Yes Has patient had a PCN reaction causing severe rash involving mucus membranes or skin necrosis: Yes Has patient had a PCN reaction that required hospitalization No Has patient had  a PCN reaction occurring within the last 10 years: Yes If all of the above answers are "NO", then may proceed with Cephalosporin use.   . Sulfa Antibiotics Rash    Current Outpatient Medications  Medication Sig Dispense Refill  . acetaminophen (TYLENOL) 500 MG tablet Take 1,000 mg by mouth every 6 (six) hours as needed for moderate pain or headache.    [name] amLODipine (NORVASC) 10 MG tablet TAKE 1 TABLET BY MOUTH ONCE DAILY 90 tablet 4  . chlorthalidone (HYGROTON) 25 MG tablet Take 1 tablet (25 mg total) by mouth daily. 90 tablet 4  . Coenzyme Q10 (COQ10) 200 MG CAPS Take 1 capsule by mouth daily.    [name] lovastatin  (MEVACOR) 40 MG tablet Take 1 tablet (40 mg total) by mouth at bedtime. 30 tablet 12  . meclizine (ANTIVERT) 25 MG tablet Take 1 tablet (25 mg total) by mouth 3 (three) times daily as needed for dizziness. 30 tablet 0  . promethazine (PHENERGAN) 25 MG tablet Take 1 tablet (25 mg total) by mouth every 8 (eight) hours as needed for nausea or vomiting. 20 tablet 0  . tamoxifen (NOLVADEX) 20 MG tablet TAKE ONE (1) TABLET BY MOUTH EVERY DAY 90 tablet 3   No current facility-administered medications for this visit.     Review of Systems Review of Systems  Constitutional: Negative.   Respiratory: Negative.   Cardiovascular: Negative.     Blood pressure (!) 162/79, pulse 91, resp. rate 14, height 5\' 6"  (1.676 m), weight 219 lb (99.3 kg).  Physical Exam Physical Exam  Constitutional: She is oriented to person, place, and time. She appears well-developed and well-nourished.  Eyes: Conjunctivae are normal. No scleral icterus.  Neck: Neck supple.  Cardiovascular: Normal rate, regular rhythm and normal heart sounds.  Pulmonary/Chest: Effort normal and breath sounds normal. Right breast exhibits no inverted nipple, no mass, no nipple discharge, no skin change and no tenderness. Left breast exhibits no inverted nipple, no mass, no nipple discharge, no skin change and no tenderness.  Lymphadenopathy:    She has no cervical adenopathy.    She has no axillary adenopathy.  Neurological: She is alert and oriented to person, place, and time.  Skin: Skin is warm and dry.    Data Reviewed Review of recommendations from 2012 consensus group was that tubulovillous polyps with high-grade dysplasia should have a 3-year follow-up.  These were completely resected in 2018 and a follow-up will be deferred for at least another year.  Bilateral mammograms of August 22, 2018 showed the microcalcifications unchanged.  BI-RADS 3.  One year follow-up recommended.  Assessment    Doing well post 2018  colonoscopy.  Benign breast exam.  Stable microcalcifications post stereotactic biopsy.    Plan  The patient has been asked to return to the office in one year with a bilateral diagnostic mammogram.The patient is aware to call back for any questions or concerns. Colonoscopy due in 2011.   Finish her present supply of tamoxifen this year.  She will completed 5 years of therapy. HPI, Physical Exam, Assessment and Plan have been scribed under the direction and in the presence of Donnalee Curry, MD.  Ples Specter, CMA  I have completed the exam and reviewed the above documentation for accuracy and completeness.  I agree with the above.  Museum/gallery conservator has been used and any errors in dictation or transcription are unintentional.  Donnalee Curry, M.D., F.A.C.S.  Merrily Pew Vaibhav Fogleman 09/13/2018, 7:25 PM

## 2018-09-13 NOTE — Patient Instructions (Addendum)
The patient has been asked to return to the office in one year with a bilateral diagnostic mammogram.Colonoscopy due in 2021  The patient is aware to call back for any questions or concerns.

## 2018-11-01 DIAGNOSIS — M25562 Pain in left knee: Secondary | ICD-10-CM | POA: Diagnosis not present

## 2018-11-01 DIAGNOSIS — Z96653 Presence of artificial knee joint, bilateral: Secondary | ICD-10-CM | POA: Diagnosis not present

## 2018-11-01 DIAGNOSIS — M25561 Pain in right knee: Secondary | ICD-10-CM | POA: Diagnosis not present

## 2019-01-06 ENCOUNTER — Encounter: Payer: Self-pay | Admitting: Family Medicine

## 2019-01-06 ENCOUNTER — Ambulatory Visit
Admission: RE | Admit: 2019-01-06 | Discharge: 2019-01-06 | Disposition: A | Discharge status: Home / Self Care | Payer: Medicare Other | Source: Ambulatory Visit | Attending: Family Medicine | Admitting: Family Medicine

## 2019-01-06 ENCOUNTER — Ambulatory Visit (INDEPENDENT_AMBULATORY_CARE_PROVIDER_SITE_OTHER): Payer: Medicare Other | Admitting: Family Medicine

## 2019-01-06 VITALS — BP 142/78 | HR 69 | Temp 98.8°F | Resp 18 | Wt 216.0 lb

## 2019-01-06 DIAGNOSIS — M5442 Lumbago with sciatica, left side: Secondary | ICD-10-CM

## 2019-01-06 DIAGNOSIS — M5432 Sciatica, left side: Secondary | ICD-10-CM | POA: Diagnosis not present

## 2019-01-06 DIAGNOSIS — I700 Atherosclerosis of aorta: Secondary | ICD-10-CM | POA: Insufficient documentation

## 2019-01-06 DIAGNOSIS — M545 Low back pain: Secondary | ICD-10-CM | POA: Diagnosis not present

## 2019-01-06 MED ORDER — NAPROXEN 500 MG PO TABS: 500.0000 mg | ORAL_TABLET | Freq: Two times a day (BID) | ORAL | 0 refills | Status: DC

## 2019-01-06 MED ORDER — TRAMADOL HCL 50 MG PO TABS: 50.0000 mg | ORAL_TABLET | Freq: Three times a day (TID) | ORAL | 0 refills | Status: AC | PRN

## 2019-01-06 NOTE — Progress Notes (Signed)
Patient: Lindsay Russo Female    DOB: 28-Jan-1946   73 y.o.   MRN: [id number] Visit Date: 01/06/2019  Today's Provider: Lelon Huh, MD   Chief Complaint  Patient presents with  . Hip Pain    x 3 months   Subjective:     Hip Pain   Incident onset: 3 months ago. There was no injury mechanism. The pain is present in the left leg and left hip. The quality of the pain is described as aching. The pain has been worsening since onset. Exacerbated by: laying on the left side or prolonged sitting. She has tried acetaminophen (also [employer] and Bio-Freeze) for the symptoms. The treatment provided no relief.  Patient starts in the left hip, and radiates down the back of her left leg. Patient also states it feels like heat from the left hip.  Allergies  Allergen Reactions  . Tegaderm Ag Mesh [Silver] Other (See Comments)    blisters  . Amoxicillin Hives and Rash    Has patient had a PCN reaction causing immediate rash, facial/tongue/throat swelling, SOB or lightheadedness with hypotension: Yes Has patient had a PCN reaction causing severe rash involving mucus membranes or skin necrosis: Yes Has patient had a PCN reaction that required hospitalization No Has patient had a PCN reaction occurring within the last 10 years: Yes If all of the above answers are "NO", then may proceed with Cephalosporin use.   . Sulfa Antibiotics Rash     Current Outpatient Medications:  .  acetaminophen (TYLENOL) 500 MG tablet, Take 1,000 mg by mouth every 6 (six) hours as needed for moderate pain or headache., Disp: , Rfl:  .  amLODipine (NORVASC) 10 MG tablet, TAKE 1 TABLET BY MOUTH ONCE DAILY, Disp: 90 tablet, Rfl: 4 .  lovastatin (MEVACOR) 40 MG tablet, Take 1 tablet (40 mg total) by mouth at bedtime., Disp: 30 tablet, Rfl: 12 .  meclizine (ANTIVERT) 25 MG tablet, Take 1 tablet (25 mg total) by mouth 3 (three) times daily as needed for dizziness., Disp: 30 tablet, Rfl: 0 .  promethazine (PHENERGAN) 25  MG tablet, Take 1 tablet (25 mg total) by mouth every 8 (eight) hours as needed for nausea or vomiting., Disp: 20 tablet, Rfl: 0 .  chlorthalidone (HYGROTON) 25 MG tablet, Take 1 tablet (25 mg total) by mouth daily. (Patient not taking: Reported on 01/06/2019), Disp: 90 tablet, Rfl: 4  Review of Systems  Constitutional: Negative for appetite change, chills, fatigue and fever.  Respiratory: Negative for chest tightness and shortness of breath.   Cardiovascular: Negative for chest pain and palpitations.  Gastrointestinal: Negative for abdominal pain, nausea and vomiting.  Musculoskeletal: Positive for arthralgias.  Neurological: Negative for dizziness and weakness.  Hematological: Bruises/bleeds easily.    Social History   Tobacco Use  . Smoking status: Never Smoker  . Smokeless tobacco: Never Used  Substance Use Topics  . Alcohol use: No      Objective:   BP (!) 142/78 (BP Location: Left Arm, Patient Position: Sitting, Cuff Size: Large)   Pulse 69   Temp 98.8 F (37.1 C) (Oral)   Resp 18   Wt 216 lb (98 kg)   SpO2 96% Comment: room air  BMI 34.86 kg/m     Physical Exam   General Appearance:    Alert, cooperative, no distress  Eyes:    PERRL, conjunctiva/corneas clear, EOM's intact       Lungs:     Clear to  auscultation bilaterally, respirations unlabored  Heart:    Regular rate and rhythm  MS:  Slight tenderness left para lumbar muscles. No leg tenderness of leg or thight. .          Assessment & Plan    1. Sciatica of left side  - traMADol (ULTRAM) 50 MG tablet; Take 1 tablet (50 mg total) by mouth every 8 (eight) hours as needed for up to 5 days.  Dispense: 15 tablet; Refill: 0 - naproxen (NAPROSYN) 500 MG tablet; Take 1 tablet (500 mg total) by mouth 2 (two) times daily with a meal.  Dispense: 30 tablet; Refill: 0  2. Acute left-sided low back pain with left-sided sciatica  - DG Lumbar Spine Complete; Future     Lelon Huh, MD  [facility]

## 2019-01-09 ENCOUNTER — Telehealth: Payer: Self-pay | Admitting: Family Medicine

## 2019-01-09 NOTE — Telephone Encounter (Signed)
Pt advised.   Thanks,   -Laura  

## 2019-01-09 NOTE — Telephone Encounter (Signed)
-----   Message from Birdie Sons, MD sent at 01/08/2019  8:58 AM EST ----- Xray confirms moderate arthritis in back which is probably causing her sciatica. Should improve with 10-14 days of naprosyn that was prescribed.if not much better next week then may need round of prednisone.

## 2019-01-09 NOTE — Telephone Encounter (Signed)
Wants to know if we have gotten the xray report back from last week that she had on her back  CB#  [phone]  Thanks Con Memos

## 2019-01-11 NOTE — Patient Instructions (Signed)
.   Please review the attached list of medications and notify my office if there are any errors.   . Please bring all of your medications to every appointment so we can make sure that our medication list is the same as yours.   

## 2019-03-06 ENCOUNTER — Ambulatory Visit: Payer: Self-pay

## 2019-03-06 ENCOUNTER — Encounter: Payer: Self-pay | Admitting: Family Medicine

## 2019-03-29 ENCOUNTER — Other Ambulatory Visit: Payer: Self-pay | Admitting: Family Medicine

## 2019-03-29 DIAGNOSIS — I10 Essential (primary) hypertension: Secondary | ICD-10-CM

## 2019-03-30 ENCOUNTER — Other Ambulatory Visit: Payer: Self-pay | Admitting: *Deleted

## 2019-03-30 DIAGNOSIS — E782 Mixed hyperlipidemia: Secondary | ICD-10-CM

## 2019-03-31 ENCOUNTER — Other Ambulatory Visit: Payer: Self-pay | Admitting: Family Medicine

## 2019-03-31 DIAGNOSIS — E782 Mixed hyperlipidemia: Secondary | ICD-10-CM

## 2019-04-18 ENCOUNTER — Encounter: Payer: Self-pay | Admitting: Family Medicine

## 2019-04-18 ENCOUNTER — Ambulatory Visit (INDEPENDENT_AMBULATORY_CARE_PROVIDER_SITE_OTHER): Payer: Medicare Other | Admitting: Family Medicine

## 2019-04-18 ENCOUNTER — Other Ambulatory Visit: Payer: Self-pay

## 2019-04-18 VITALS — BP 134/74 | HR 70 | Temp 98.7°F | Ht 66.0 in | Wt 209.2 lb

## 2019-04-18 DIAGNOSIS — I700 Atherosclerosis of aorta: Secondary | ICD-10-CM

## 2019-04-18 DIAGNOSIS — G8929 Other chronic pain: Secondary | ICD-10-CM

## 2019-04-18 DIAGNOSIS — Z0000 Encounter for general adult medical examination without abnormal findings: Secondary | ICD-10-CM | POA: Diagnosis not present

## 2019-04-18 DIAGNOSIS — E782 Mixed hyperlipidemia: Secondary | ICD-10-CM | POA: Diagnosis not present

## 2019-04-18 DIAGNOSIS — I10 Essential (primary) hypertension: Secondary | ICD-10-CM | POA: Diagnosis not present

## 2019-04-18 DIAGNOSIS — R7303 Prediabetes: Secondary | ICD-10-CM

## 2019-04-18 DIAGNOSIS — D692 Other nonthrombocytopenic purpura: Secondary | ICD-10-CM | POA: Diagnosis not present

## 2019-04-18 DIAGNOSIS — I679 Cerebrovascular disease, unspecified: Secondary | ICD-10-CM | POA: Diagnosis not present

## 2019-04-18 DIAGNOSIS — R42 Dizziness and giddiness: Secondary | ICD-10-CM

## 2019-04-18 DIAGNOSIS — M545 Low back pain, unspecified: Secondary | ICD-10-CM

## 2019-04-18 MED ORDER — TRAMADOL HCL 50 MG PO TABS: 50.0000 mg | ORAL_TABLET | Freq: Four times a day (QID) | ORAL | 3 refills | Status: DC | PRN

## 2019-04-18 MED ORDER — PROMETHAZINE HCL 25 MG PO TABS: 25.0000 mg | ORAL_TABLET | Freq: Three times a day (TID) | ORAL | 0 refills | Status: DC | PRN

## 2019-04-18 MED ORDER — MECLIZINE HCL 25 MG PO TABS: 25.0000 mg | ORAL_TABLET | Freq: Three times a day (TID) | ORAL | 0 refills | Status: DC | PRN

## 2019-04-18 NOTE — Progress Notes (Signed)
Patient: Lindsay Russo, Female    DOB: [birth year], 73 y.o.   MRN: [id number] Visit Date: 04/18/2019  Today's Provider: Lelon Huh, MD   Chief Complaint  Patient presents with  . Annual Exam   Subjective:     Annual physical Lindsay Russo is a 73 y.o. female. She feels well. She reports exercising includes walking. She reports she is sleeping fairly well. She continues to follow up with Dr. Bary Castilla for breast exams, mammograms and colonoscopy.   ----------------------------------------------------------- She does report today she has felt increasingly fatigue for the last few months. She does not sleep well, usually only a few hours at a time and 4-5 hours a night overall. Denies loud snoring. She does worry a lot about her husbands health.   She complains of intermittent low back pain which does not radiate. She has taken tramadol in the past which she states she tolerated well and requests refill for today.   Follow up hypertension She reports she stopped taking chlorthalidone about a month ago due to rash, but her home blood pressures have been staying consistently in the 130s.   Review of Systems  Constitutional: Positive for fatigue.  HENT: Negative.   Eyes: Negative.   Respiratory: Positive for shortness of breath.   Cardiovascular: Negative.   Gastrointestinal: Negative.   Endocrine: Negative.   Genitourinary: Negative.   Musculoskeletal: Positive for myalgias.  Skin: Negative.   Allergic/Immunologic: Negative.   Neurological: Negative.   Hematological: Negative.   Psychiatric/Behavioral: Negative.     Social History   Socioeconomic History  . Marital status: Married    Spouse name: Not on file  . Number of children: 2  . Years of education: Not on file  . Highest education level: Associate degree: academic program  Occupational History  . Occupation: Retired  Scientific laboratory technician  . Financial resource strain: Not hard at all  . Food insecurity:   Worry: Never true    Inability: Never true  . Transportation needs:    Medical: No    Non-medical: No  Tobacco Use  . Smoking status: Never Smoker  . Smokeless tobacco: Never Used  Substance and Sexual Activity  . Alcohol use: No  . Drug use: No  . Sexual activity: Not on file  Lifestyle  . Physical activity:    Days per week: Not on file    Minutes per session: Not on file  . Stress: Not at all  Relationships  . Social connections:    Talks on phone: Not on file    Gets together: Not on file    Attends religious service: Not on file    Active member of club or organization: Not on file    Attends meetings of clubs or organizations: Not on file    Relationship status: Not on file  . Intimate partner violence:    Fear of current or ex partner: Not on file    Emotionally abused: Not on file    Physically abused: Not on file    Forced sexual activity: Not on file  Other Topics Concern  . Not on file  Social History Narrative  . Not on file    Past Medical History:  Diagnosis Date  . Arthritis   . History of kidney stones   . Hypertension   . LCIS September 05, 2013   Left breast, ER 90%, PR 60%  . Malignant neoplasm of ascending colon [facility]) November 2007   T2, N0.5.2  cm low-grade adenocarcinoma. 0/28 nodes positive.  . Personal history of colonic polyps   . Spinal headache 1995   at [facility] spinal made me feel dead, had no control.  . Stroke ([place]) 09/17/2010   Right thalamic without residual effects  . Tubulovillous adenoma polyp of colon 10/06/2017   2 polyps, 5 and 10 mm completely resected from the area of the ileocolic anastomosis.  Tubulovillous polyp with high-grade dysplasia.     Patient Active Problem List   Diagnosis Date Noted  . Aortic atherosclerosis ([place]) 01/06/2019  . Tubulovillous adenoma polyp of colon 09/13/2018  . Rash due to allergy 08/08/2018  . Restless leg syndrome 08/08/2018  . Breast calcification, left 08/16/2017  . S/P total knee  arthroplasty 10/26/2016  . Allergic rhinitis 11/21/2015  . Personal history of urinary calculi 11/21/2015  . History of migraine 11/21/2015  . CN (constipation) 11/21/2015  . Arthritis 11/21/2015  . Vertigo 05/13/2015  . Senile purpura ([place]) 05/13/2015  . Cerebrovascular disease 03/26/2015  . Degenerative disc disease, lumbar 03/26/2015  . Obesity 03/26/2015  . Hyperlipidemia, mixed 03/26/2015  . Hypertension 03/26/2015  . Pre-diabetes 03/26/2015  . History of left breast cancer 08/21/2014  . Lobular carcinoma in situ 09/26/2013  . Breast neoplasm, Tis (LCIS) 09/12/2013  . Breast microcalcification, mammographic 07/26/2013  . History of CVA (cerebrovascular accident) 09/17/2010  . Malignant neoplasm of ascending colon ([place]) 09/30/2006  . History of colon cancer 09/30/2006    Past Surgical History:  Procedure Laterality Date  . APPENDECTOMY  1995  . BREAST BIOPSY Right August 07, 2010   Sclerotic fibroadenoma removed from the right breast at the 10:00 position  . BREAST BIOPSY Left 08/14/2013   left breast superior LCIS. per path report: Pleomorphic LCIS has similar behavior to DCIS.  [name] BREAST BIOPSY Left 08/14/2013   left breast superior fibroadenoma  . BREAST BIOPSY Left 08/23/2017   stereo bx with Dr. Bary Castilla of lower inner quad. FIBROADENOMATOID STROMAL FIBROSIS WITH ASSOCIATED CALCIFICATIONS.   [name] BREAST BIOPSY    . BREAST LUMPECTOMY Left 09/05/2013   clear margins  . BREAST SURGERY Left 09/05/13   LCIS, ER 90%, PR 60%, negative margins.  . COLONOSCOPY  August 03, 2012   8 mm tubulovillous adenoma removed from the transverse colon. Followup exam in 2018 plan.  . COLONOSCOPY WITH PROPOFOL N/A 10/06/2017   Procedure: COLONOSCOPY WITH PROPOFOL;  Surgeon: Robert Bellow, MD;  Location: [facility] ENDOSCOPY;  Service: Endoscopy;  Laterality: N/A;  . DOPPLER ECHOCARDIOGRAPHY  09/17/2010   EF 55%, Mild to moderate MR and mild to moderate TR  . HEEL SPUR EXCISION Left 2005  .  JOINT REPLACEMENT Right 2012  . KNEE ARTHROPLASTY Left 10/26/2016   Procedure: COMPUTER ASSISTED TOTAL KNEE ARTHROPLASTY;  Surgeon: Dereck Leep, MD;  Location: [facility] ORS;  Service: Orthopedics;  Laterality: Left;  . LUMBAR DISC SURGERY  2009   L4-5 microdiscectomy Dr. Mauri Pole  . OVARIAN CYST REMOVAL  1980  . PARTIAL COLECTOMY Right 2007   Dr. Bary Castilla  . REPLACEMENT TOTAL KNEE Right 12/2010    Her family history includes Brain cancer in her father; Epilepsy in an other family member; Gastric cancer in her father; Heart disease in an other family member; Hypertension in her sister; Ulcers in an other family member.   Current Outpatient Medications:  .  acetaminophen (TYLENOL) 500 MG tablet, Take 1,000 mg by mouth every 6 (six) hours as needed for moderate pain or headache., Disp: , Rfl:  .  amLODipine (  NORVASC) 10 MG tablet, TAKE 1 TABLET BY MOUTH ONCE DAILY., Disp: 90 tablet, Rfl: 4 .  lovastatin (MEVACOR) 40 MG tablet, TAKE 1 TABLET BY MOUTH AT BEDTIME., Disp: 30 tablet, Rfl: 12 .  meclizine (ANTIVERT) 25 MG tablet, Take 1 tablet (25 mg total) by mouth 3 (three) times daily as needed for dizziness., Disp: 30 tablet, Rfl: 0 .  naproxen (NAPROSYN) 500 MG tablet, Take 1 tablet (500 mg total) by mouth 2 (two) times daily with a meal., Disp: 30 tablet, Rfl: 0 .  promethazine (PHENERGAN) 25 MG tablet, Take 1 tablet (25 mg total) by mouth every 8 (eight) hours as needed for nausea or vomiting., Disp: 20 tablet, Rfl: 0 .  chlorthalidone (HYGROTON) 25 MG tablet, Take 1 tablet (25 mg total) by mouth daily. (Patient not taking: Reported on 01/06/2019), Disp: 90 tablet, Rfl: 4  Patient Care Team: Birdie Sons, MD as PCP - General (Family Medicine) Bary Castilla, Forest Gleason, MD (General Surgery) Anell Barr, OD as Consulting Physician (Optometry) Marry Guan, Laurice Record, MD as Consulting Physician (Orthopedic Surgery)    Objective:    Vitals: BP 134/74 (BP Location: Right Arm, Patient Position: Sitting,  Cuff Size: Large)   Pulse 70   Temp 98.7 F (37.1 C) (Oral)   Ht 5\' 6"  (1.676 m)   Wt 209 lb 3.2 oz (94.9 kg)   SpO2 98%   BMI 33.77 kg/m   Physical Exam  General Appearance:    Alert, cooperative, no distress, appears stated age  Head:    Normocephalic, without obvious abnormality, atraumatic  Eyes:    PERRL, conjunctiva/corneas clear, EOM's intact, fundi    benign, both eyes  Ears:    Normal TM's and external ear canals, both ears  Nose:   Nares normal, septum midline, mucosa normal, no drainage    or sinus tenderness  Throat:   Lips, mucosa, and tongue normal; teeth and gums normal  Neck:   Supple, symmetrical, trachea midline, no adenopathy;    thyroid:  no enlargement/tenderness/nodules; no carotid   bruit or JVD  Back:     Symmetric, no curvature, ROM normal, no CVA tenderness  Lungs:     Clear to auscultation bilaterally, respirations unlabored  Chest Wall:    No tenderness or deformity   Heart:    Regular rate and rhythm, S1 and S2 normal, no murmur, rub   or gallop  Breast Exam:    deferred  Abdomen:     Soft, non-tender, bowel sounds active all four quadrants,    no masses, no organomegaly  Pelvic:    deferred  Extremities:   Extremities normal, atraumatic, no cyanosis or edema  Pulses:   2+ and symmetric all extremities  Skin:   Skin color, texture, turgor normal, no rashes or lesions  Lymph nodes:   Cervical, supraclavicular, and axillary nodes normal  Neurologic:   CNII-XII intact, normal strength, sensation and reflexes    throughout       Assessment & Plan:    1. Annual physical exam Mostly doing well. She does sleep poorly. Recommend sleep study which she declined at this time.   2. Senile purpura ([place]) Better since stopping ECASA  3. Essential hypertension Well controlled.  Continue current medications.    4. Cerebrovascular disease Off ECASA due to bruising. Consider clopidogrel   5. Pre-diabetes  - Hemoglobin A1c  6. Hyperlipidemia, mixed   - CBC with Differential/Platelet - Comprehensive metabolic panel - Lipid panel  7. Aortic atherosclerosis ([place]) Continue  statin.   8. Vertigo Intermittent, she requests refill- promethazine (PHENERGAN) 25 MG tablet; Take 1 tablet (25 mg total) by mouth every 8 (eight) hours as needed for nausea or vomiting.  Dispense: 20 tablet; Refill: 0 - meclizine (ANTIVERT) 25 MG tablet; Take 1 tablet (25 mg total) by mouth 3 (three) times daily as needed for dizziness.  Dispense: 30 tablet; Refill: 0  9. Chronic midline low back pain without sciatica Chronic, has done well with occasiona tramadol in the past. refill - traMADol (ULTRAM) 50 MG tablet; Take 1 tablet (50 mg total) by mouth every 6 (six) hours as needed.  Dispense: 20 tablet; Refill: 3     Lelon Huh, MD  [facility]

## 2019-04-18 NOTE — Progress Notes (Signed)
Patient: BETHANY HIRT, Female    DOB: June 21, 1946, 73 y.o.   MRN: [id number] Visit Date: 04/18/2019  Today's Provider: Lelon Huh, MD   Chief Complaint  Patient presents with  . Medicare Wellness   Subjective:     Annual wellness visit YOUA DELONEY is a 73 y.o. female. She feels fairly well. She reports exercising occasionally by walking. . She reports she is sleeping poorly.  -----------------------------------------------------------     Social History   Socioeconomic History  . Marital status: Married    Spouse name: Not on file  . Number of children: 2  . Years of education: Not on file  . Highest education level: Associate degree: academic program  Occupational History  . Occupation: Retired  Scientific laboratory technician  . Financial resource strain: Not hard at all  . Food insecurity:    Worry: Never true    Inability: Never true  . Transportation needs:    Medical: No    Non-medical: No  Tobacco Use  . Smoking status: Never Smoker  . Smokeless tobacco: Never Used  Substance and Sexual Activity  . Alcohol use: No  . Drug use: No  . Sexual activity: Not on file  Lifestyle  . Physical activity:    Days per week: Not on file    Minutes per session: Not on file  . Stress: Not at all  Relationships  . Social connections:    Talks on phone: Not on file    Gets together: Not on file    Attends religious service: Not on file    Active member of club or organization: Not on file    Attends meetings of clubs or organizations: Not on file    Relationship status: Not on file  . Intimate partner violence:    Fear of current or ex partner: Not on file    Emotionally abused: Not on file    Physically abused: Not on file    Forced sexual activity: Not on file  Other Topics Concern  . Not on file  Social History Narrative  . Not on file    Past Medical History:  Diagnosis Date  . Arthritis   . History of kidney stones   . Hypertension   . LCIS September 05, 2013   Left breast, ER 90%, PR 60%  . Malignant neoplasm of ascending colon [facility]) November 2007   T2, N0.5.2 cm low-grade adenocarcinoma. 0/28 nodes positive.  . Personal history of colonic polyps   . Spinal headache 1995   at [facility] spinal made me feel dead, had no control.  . Stroke ([place]) 09/17/2010   Right thalamic without residual effects  . Tubulovillous adenoma polyp of colon 10/06/2017   2 polyps, 5 and 10 mm completely resected from the area of the ileocolic anastomosis.  Tubulovillous polyp with high-grade dysplasia.     Patient Active Problem List   Diagnosis Date Noted  . Aortic atherosclerosis ([place]) 01/06/2019  . Tubulovillous adenoma polyp of colon 09/13/2018  . Rash due to allergy 08/08/2018  . Restless leg syndrome 08/08/2018  . Breast calcification, left 08/16/2017  . S/P total knee arthroplasty 10/26/2016  . Allergic rhinitis 11/21/2015  . Personal history of urinary calculi 11/21/2015  . History of migraine 11/21/2015  . CN (constipation) 11/21/2015  . Arthritis 11/21/2015  . Vertigo 05/13/2015  . Senile purpura ([place]) 05/13/2015  . Cerebrovascular disease 03/26/2015  . Degenerative disc disease, lumbar 03/26/2015  . Obesity 03/26/2015  .  Hyperlipidemia, mixed 03/26/2015  . Hypertension 03/26/2015  . Pre-diabetes 03/26/2015  . History of left breast cancer 08/21/2014  . Lobular carcinoma in situ 09/26/2013  . Breast neoplasm, Tis (LCIS) 09/12/2013  . Breast microcalcification, mammographic 07/26/2013  . History of CVA (cerebrovascular accident) 09/17/2010  . Malignant neoplasm of ascending colon ([place]) 09/30/2006  . History of colon cancer 09/30/2006    Past Surgical History:  Procedure Laterality Date  . APPENDECTOMY  1995  . BREAST BIOPSY Right August 07, 2010   Sclerotic fibroadenoma removed from the right breast at the 10:00 position  . BREAST BIOPSY Left 08/14/2013   left breast superior LCIS. per path report: Pleomorphic LCIS has similar  behavior to DCIS.  [name] BREAST BIOPSY Left 08/14/2013   left breast superior fibroadenoma  . BREAST BIOPSY Left 08/23/2017   stereo bx with Dr. Bary Castilla of lower inner quad. FIBROADENOMATOID STROMAL FIBROSIS WITH ASSOCIATED CALCIFICATIONS.   [name] BREAST BIOPSY    . BREAST LUMPECTOMY Left 09/05/2013   clear margins  . BREAST SURGERY Left 09/05/13   LCIS, ER 90%, PR 60%, negative margins.  . COLONOSCOPY  August 03, 2012   8 mm tubulovillous adenoma removed from the transverse colon. Followup exam in 2018 plan.  . COLONOSCOPY WITH PROPOFOL N/A 10/06/2017   Procedure: COLONOSCOPY WITH PROPOFOL;  Surgeon: Robert Bellow, MD;  Location: [facility] ENDOSCOPY;  Service: Endoscopy;  Laterality: N/A;  . DOPPLER ECHOCARDIOGRAPHY  09/17/2010   EF 55%, Mild to moderate MR and mild to moderate TR  . HEEL SPUR EXCISION Left 2005  . JOINT REPLACEMENT Right 2012  . KNEE ARTHROPLASTY Left 10/26/2016   Procedure: COMPUTER ASSISTED TOTAL KNEE ARTHROPLASTY;  Surgeon: Dereck Leep, MD;  Location: [facility] ORS;  Service: Orthopedics;  Laterality: Left;  . LUMBAR DISC SURGERY  2009   L4-5 microdiscectomy Dr. Mauri Pole  . OVARIAN CYST REMOVAL  1980  . PARTIAL COLECTOMY Right 2007   Dr. Bary Castilla  . REPLACEMENT TOTAL KNEE Right 12/2010    Her family history includes Brain cancer in her father; Epilepsy in an other family member; Gastric cancer in her father; Heart disease in an other family member; Hypertension in her sister; Ulcers in an other family member.   Current Outpatient Medications:  .  acetaminophen (TYLENOL) 500 MG tablet, Take 1,000 mg by mouth every 6 (six) hours as needed for moderate pain or headache., Disp: , Rfl:  .  amLODipine (NORVASC) 10 MG tablet, TAKE 1 TABLET BY MOUTH ONCE DAILY., Disp: 90 tablet, Rfl: 4 .  lovastatin (MEVACOR) 40 MG tablet, TAKE 1 TABLET BY MOUTH AT BEDTIME., Disp: 30 tablet, Rfl: 12 .  meclizine (ANTIVERT) 25 MG tablet, Take 1 tablet (25 mg total) by mouth 3 (three) times daily as  needed for dizziness., Disp: 30 tablet, Rfl: 0 .  naproxen (NAPROSYN) 500 MG tablet, Take 1 tablet (500 mg total) by mouth 2 (two) times daily with a meal., Disp: 30 tablet, Rfl: 0 .  promethazine (PHENERGAN) 25 MG tablet, Take 1 tablet (25 mg total) by mouth every 8 (eight) hours as needed for nausea or vomiting., Disp: 20 tablet, Rfl: 0 .  traMADol (ULTRAM) 50 MG tablet, Take 1 tablet (50 mg total) by mouth every 6 (six) hours as needed., Disp: 20 tablet, Rfl: 3  Patient Care Team: Birdie Sons, MD as PCP - General (Family Medicine) Bary Castilla, Forest Gleason, MD (General Surgery) Anell Barr, OD as Consulting Physician (Optometry) Marry Guan, Laurice Record, MD as Consulting Physician (Orthopedic Surgery)  Objective:     Activities of Daily Living In your present state of health, do you have any difficulty performing the following activities: 04/18/2019  Hearing? N  Vision? N  Difficulty concentrating or making decisions? N  Walking or climbing stairs? N  Dressing or bathing? N  Doing errands, shopping? N  Some recent data might be hidden    Fall Risk Assessment Fall Risk  04/18/2019 01/28/2018 01/25/2017 01/25/2017 11/25/2016  Falls in the past year? 0 No No No No  Number falls in past yr: 0 - - - -  Injury with Fall? 0 - - - -     Depression Screen PHQ 2/9 Scores 04/18/2019 01/28/2018 01/28/2018 01/25/2017  PHQ - 2 Score 0 0 0 0  PHQ- 9 Score 2 2 - -    No flowsheet data found.    Assessment & Plan:     Annual Wellness Visit  Reviewed patient's Family Medical History Reviewed and updated list of patient's medical providers Assessment of cognitive impairment was done Assessed patient's functional ability Established a written schedule for health screening [place] Completed and Reviewed  Exercise Activities and Dietary recommendations Goals    . DIET - DECREASE SODA OR JUICE INTAKE     Recommend decreasing amount of soda intake to 1 daily instead of 2-3 a  day.       Immunization History  Administered Date(s) Administered  . Influenza, High Dose Seasonal PF 12/21/2016, 10/04/2017  . Pneumococcal Conjugate-13 11/12/2014  . Pneumococcal Polysaccharide-23 01/11/2012  . Tdap 04/06/2007    Health Maintenance  Topic Date Due  . TETANUS/TDAP  04/05/2017  . INFLUENZA VACCINE  07/01/2019  . MAMMOGRAM  08/23/2019  . DEXA SCAN  11/27/2020  . COLONOSCOPY  10/06/2022  . Hepatitis C Screening  Completed  . PNA vac Low Risk Adult  Completed     Discussed health benefits of physical activity, and encouraged her to engage in regular exercise appropriate for her age and condition.    ------------------------------------------------------------------------------------------------------------    Lelon Huh, MD  [place]

## 2019-04-18 NOTE — Patient Instructions (Addendum)
.   Please review the attached list of medications and notify my office if there are any errors.   . Please bring all of your medications to every appointment so we can make sure that our medication list is the same as yours.    I recommend you have a sleep study done at your home. Call our office at [phone] if you decide to have this done   You can try taking OTC dissolvable vitamin B12, 1000 mg once a day to help with energy

## 2019-04-19 LAB — LIPID PANEL
Chol/HDL Ratio: 3.6 ratio (ref 0.0–4.4)
Cholesterol, Total: 162 mg/dL (ref 100–199)
HDL: 45 mg/dL (ref >39)
LDL Calculated: 83 mg/dL (ref 0–99)
Triglycerides: 171 mg/dL — ABNORMAL HIGH (ref 0–149)
VLDL Cholesterol Cal: 34 mg/dL (ref 5–40)

## 2019-04-19 LAB — CBC WITH DIFFERENTIAL/PLATELET
Basophils Absolute: 0.1 10*3/uL (ref 0.0–0.2)
Basos: 1 %
EOS (ABSOLUTE): 0.2 10*3/uL (ref 0.0–0.4)
Eos: 2 %
Hematocrit: 45.3 % (ref 34.0–46.6)
Hemoglobin: 15.1 g/dL (ref 11.1–15.9)
Immature Grans (Abs): 0 10*3/uL (ref 0.0–0.1)
Immature Granulocytes: 0 %
Lymphocytes Absolute: 2.6 10*3/uL (ref 0.7–3.1)
Lymphs: 26 %
MCH: 29.8 pg (ref 26.6–33.0)
MCHC: 33.3 g/dL (ref 31.5–35.7)
MCV: 90 fL (ref 79–97)
Monocytes Absolute: 0.6 10*3/uL (ref 0.1–0.9)
Monocytes: 6 %
Neutrophils Absolute: 6.7 10*3/uL (ref 1.4–7.0)
Neutrophils: 65 %
Platelets: 233 10*3/uL (ref 150–450)
RBC: 5.06 x10E6/uL (ref 3.77–5.28)
RDW: 12.7 % (ref 11.7–15.4)
WBC: 10.1 10*3/uL (ref 3.4–10.8)

## 2019-04-19 LAB — COMPREHENSIVE METABOLIC PANEL
ALT: 12 IU/L (ref 0–32)
AST: 20 IU/L (ref 0–40)
Albumin/Globulin Ratio: 1.8 (ref 1.2–2.2)
Albumin: 4.6 g/dL (ref 3.7–4.7)
Alkaline Phosphatase: 110 IU/L (ref 39–117)
BUN/Creatinine Ratio: 14 (ref 12–28)
BUN: 14 mg/dL (ref 8–27)
Bilirubin Total: 0.4 mg/dL (ref 0.0–1.2)
CO2: 22 mmol/L (ref 20–29)
Calcium: 9.9 mg/dL (ref 8.7–10.3)
Chloride: 102 mmol/L (ref 96–106)
Creatinine, Ser: 0.97 mg/dL (ref 0.57–1.00)
GFR calc Af Amer: 67 mL/min/{1.73_m2} (ref >59)
GFR calc non Af Amer: 58 mL/min/{1.73_m2} — ABNORMAL LOW (ref >59)
Globulin, Total: 2.6 g/dL (ref 1.5–4.5)
Glucose: 118 mg/dL — ABNORMAL HIGH (ref 65–99)
Potassium: 3.7 mmol/L (ref 3.5–5.2)
Sodium: 142 mmol/L (ref 134–144)
Total Protein: 7.2 g/dL (ref 6.0–8.5)

## 2019-04-19 LAB — HEMOGLOBIN A1C
Est. average glucose Bld gHb Est-mCnc: 120 mg/dL
Hgb A1c MFr Bld: 5.8 % — ABNORMAL HIGH (ref 4.8–5.6)

## 2019-06-20 ENCOUNTER — Encounter: Payer: Self-pay | Admitting: General Surgery

## 2019-07-04 ENCOUNTER — Other Ambulatory Visit: Payer: Self-pay

## 2019-07-04 DIAGNOSIS — D0512 Intraductal carcinoma in situ of left breast: Secondary | ICD-10-CM

## 2019-09-11 ENCOUNTER — Other Ambulatory Visit: Payer: Medicare Other

## 2019-09-12 ENCOUNTER — Ambulatory Visit
Admission: RE | Admit: 2019-09-12 | Discharge: 2019-09-12 | Disposition: A | Discharge status: Home / Self Care | Payer: Medicare Other | Source: Ambulatory Visit | Attending: Surgery | Admitting: Surgery

## 2019-09-12 ENCOUNTER — Other Ambulatory Visit: Payer: Self-pay | Admitting: Family Medicine

## 2019-09-12 DIAGNOSIS — R921 Mammographic calcification found on diagnostic imaging of breast: Secondary | ICD-10-CM | POA: Diagnosis not present

## 2019-09-12 DIAGNOSIS — D0512 Intraductal carcinoma in situ of left breast: Secondary | ICD-10-CM

## 2019-09-12 DIAGNOSIS — G8929 Other chronic pain: Secondary | ICD-10-CM

## 2019-09-12 DIAGNOSIS — R922 Inconclusive mammogram: Secondary | ICD-10-CM | POA: Diagnosis not present

## 2019-09-18 ENCOUNTER — Ambulatory Visit: Payer: Medicare Other | Admitting: Surgery

## 2019-10-24 ENCOUNTER — Other Ambulatory Visit: Payer: Self-pay

## 2019-10-24 ENCOUNTER — Ambulatory Visit (INDEPENDENT_AMBULATORY_CARE_PROVIDER_SITE_OTHER): Payer: Medicare Other | Admitting: Family Medicine

## 2019-10-24 ENCOUNTER — Encounter: Payer: Self-pay | Admitting: Family Medicine

## 2019-10-24 VITALS — BP 164/72 | HR 68 | Temp 97.3°F | Resp 16 | Wt 202.0 lb

## 2019-10-24 DIAGNOSIS — G8929 Other chronic pain: Secondary | ICD-10-CM

## 2019-10-24 DIAGNOSIS — M545 Low back pain: Secondary | ICD-10-CM

## 2019-10-24 DIAGNOSIS — M778 Other enthesopathies, not elsewhere classified: Secondary | ICD-10-CM | POA: Diagnosis not present

## 2019-10-24 DIAGNOSIS — R7303 Prediabetes: Secondary | ICD-10-CM

## 2019-10-24 DIAGNOSIS — Z23 Encounter for immunization: Secondary | ICD-10-CM

## 2019-10-24 DIAGNOSIS — I10 Essential (primary) hypertension: Secondary | ICD-10-CM

## 2019-10-24 LAB — POCT GLYCOSYLATED HEMOGLOBIN (HGB A1C)
Est. average glucose Bld gHb Est-mCnc: 117
Hemoglobin A1C: 5.7 % — AB (ref 4.0–5.6)

## 2019-10-24 MED ORDER — SPIRONOLACTONE 25 MG PO TABS: 25.0000 mg | ORAL_TABLET | ORAL | 0 refills | Status: DC

## 2019-10-24 MED ORDER — TRAMADOL HCL 50 MG PO TABS: 50.0000 mg | ORAL_TABLET | Freq: Four times a day (QID) | ORAL | 4 refills | Status: DC | PRN

## 2019-10-24 NOTE — Progress Notes (Signed)
Patient: Lindsay Russo Female    DOB: Nov 22, 1946   73 y.o.   MRN: [id number] Visit Date: 10/24/2019  Today's Provider: Lelon Huh, MD   Chief Complaint  Patient presents with  . Hyperglycemia  . Hyperlipidemia  . Hypertension   Subjective:     HPI  Prediabetes, Follow-up:   Lab Results  Component Value Date   HGBA1C 5.8 (H) 04/18/2019   HGBA1C 6.3 (H) 01/28/2018   HGBA1C 6.4 10/04/2017   GLUCOSE 118 (H) 04/18/2019   GLUCOSE 105 (H) 07/25/2018   GLUCOSE 120 (H) 01/28/2018    Last seen for for this6 months ago.  Management since that visit includes no change. Current symptoms include none and have been stable.  Weight trend: fluctuating a bit Prior visit with dietician: no Current diet: in general, a "healthy" diet   Current exercise: walking  Pertinent Labs:    Component Value Date/Time   CHOL 162 04/18/2019 0925   TRIG 171 (H) 04/18/2019 0925   CHOLHDL 3.6 04/18/2019 0925   CREATININE 0.97 04/18/2019 0925   CREATININE 1.12 05/04/2013 2326    Wt Readings from Last 3 Encounters:  10/24/19 202 lb (91.6 kg)  04/18/19 209 lb 3.2 oz (94.9 kg)  01/06/19 216 lb (98 kg)    Hypertension, follow-up:  BP Readings from Last 3 Encounters:  10/24/19 (!) 164/72  04/18/19 134/74  01/06/19 (!) 142/78    She was last seen for hypertension 6 months ago.  BP at that visit was 134/74. Management changes since that visit include no change. She reports good compliance with treatment. She is not having side effects.  She is exercising. She is adherent to low salt diet.   Outside blood pressures are not being checked. She is experiencing none.  Patient denies chest pain, chest pressure/discomfort, claudication, dyspnea, exertional chest pressure/discomfort, fatigue, irregular heart beat, lower extremity edema, near-syncope, orthopnea, palpitations, paroxysmal nocturnal dyspnea, syncope and tachypnea.   Cardiovascular risk factors include dyslipidemia and  hypertension.  Use of agents associated with hypertension: none.     Weight trend: fluctuating a bit Wt Readings from Last 3 Encounters:  10/24/19 202 lb (91.6 kg)  04/18/19 209 lb 3.2 oz (94.9 kg)  01/06/19 216 lb (98 kg)    Current diet: in general, a "healthy" diet    ------------------------------------------------------------------------  Lipid/Cholesterol, Follow-up:   Last seen for this6 months ago.  Management changes since that visit include no change. . Last Lipid Panel:    Component Value Date/Time   CHOL 162 04/18/2019 0925   TRIG 171 (H) 04/18/2019 0925   HDL 45 04/18/2019 0925   CHOLHDL 3.6 04/18/2019 0925   LDLCALC 83 04/18/2019 0925    Risk factors for vascular disease include hypertension and pre-diabetes  She reports good compliance with treatment. She is not having side effects.  Current symptoms include none and have been stable. Weight trend: fluctuating a bit Prior visit with dietician: no Current diet: in general, a "healthy" diet   Current exercise: none  Wt Readings from Last 3 Encounters:  10/24/19 202 lb (91.6 kg)  04/18/19 209 lb 3.2 oz (94.9 kg)  01/06/19 216 lb (98 kg)    -------------------------------------------------------------------  She also reports pain of hypothenar muscles of right thumb for the last week, doesn't recall any injury. Has been applying ice which helps.   Allergies  Allergen Reactions  . Tegaderm Ag Mesh [Silver] Other (See Comments)    blisters  . Amoxicillin Hives and Rash  Has patient had a PCN reaction causing immediate rash, facial/tongue/throat swelling, SOB or lightheadedness with hypotension: Yes Has patient had a PCN reaction causing severe rash involving mucus membranes or skin necrosis: Yes Has patient had a PCN reaction that required hospitalization No Has patient had a PCN reaction occurring within the last 10 years: Yes If all of the above answers are "NO", then may proceed with  Cephalosporin use.   . Chlorthalidone Rash  . Sulfa Antibiotics Rash     Current Outpatient Medications:  .  acetaminophen (TYLENOL) 500 MG tablet, Take 1,000 mg by mouth every 6 (six) hours as needed for moderate pain or headache., Disp: , Rfl:  .  amLODipine (NORVASC) 10 MG tablet, TAKE 1 TABLET BY MOUTH ONCE DAILY., Disp: 90 tablet, Rfl: 4 .  lovastatin (MEVACOR) 40 MG tablet, TAKE 1 TABLET BY MOUTH AT BEDTIME., Disp: 30 tablet, Rfl: 12 .  meclizine (ANTIVERT) 25 MG tablet, Take 1 tablet (25 mg total) by mouth 3 (three) times daily as needed for dizziness., Disp: 30 tablet, Rfl: 0 .  promethazine (PHENERGAN) 25 MG tablet, Take 1 tablet (25 mg total) by mouth every 8 (eight) hours as needed for nausea or vomiting., Disp: 20 tablet, Rfl: 0 .  traMADol (ULTRAM) 50 MG tablet, TAKE 1 TABLET BY MOUTH EVERY 6 HOURS AS NEEDED, Disp: 20 tablet, Rfl: 4  Review of Systems  Constitutional: Negative for appetite change, chills, fatigue and fever.  Respiratory: Negative for chest tightness and shortness of breath.   Cardiovascular: Negative for chest pain and palpitations.  Gastrointestinal: Negative for abdominal pain, nausea and vomiting.  Musculoskeletal: Positive for arthralgias (thumb pain on right hand) and back pain.  Neurological: Negative for dizziness and weakness.    Social History   Tobacco Use  . Smoking status: Never Smoker  . Smokeless tobacco: Never Used  Substance Use Topics  . Alcohol use: No      Objective:   BP (!) 164/72 (BP Location: Right Arm, Cuff Size: Large)   Pulse 68   Temp (!) 97.3 F (36.3 C) (Temporal)   Resp 16   Wt 202 lb (91.6 kg)   SpO2 98% Comment: room air  BMI 32.60 kg/m  Vitals:   10/24/19 0949 10/24/19 0954  BP: (!) 156/76 (!) 164/72  Pulse: 68   Resp: 16   Temp: (!) 97.3 F (36.3 C)   TempSrc: Temporal   SpO2: 98%   Weight: 202 lb (91.6 kg)   Body mass index is 32.6 kg/m.   Physical Exam  General appearance: Overweight female,  cooperative and in no acute distress Head: Normocephalic, without obvious abnormality, atraumatic Respiratory: Respirations even and unlabored, normal respiratory rate Extremities: All extremities are intact.  Skin: Skin color, texture, turgor normal. No rashes seen  Psych: Appropriate mood and affect. Neurologic: Mental status: Alert, oriented to person, place, and time, thought content appropriate.  Results for orders placed or performed in visit on 10/24/19  POCT HgB A1C  Result Value Ref Range   Hemoglobin A1C 5.7 (A) 4.0 - 5.6 %   Est. average glucose Bld gHb Est-mCnc 117        Assessment & Plan    1. Pre-diabetes Stable.   2. Essential hypertension add- spironolactone (ALDACTONE) 25 MG tablet; Take 1 tablet (25 mg total) by mouth every morning.  Dispense: 30 tablet; Refill: 0 Return to check bp and 'lytes in about 3 weeks.   3. Chronic midline low back pain without sciatica refill- traMADol (ULTRAM)  50 MG tablet; Take 1 tablet (50 mg total) by mouth every 6 (six) hours as needed.  Dispense: 30 tablet; Refill: 4  4. Thumb tendonitis Should be self limited, continue applying ice. Let me know if not better in 2-3 weeks.   5. Need for influenza vaccination  - Flu Vaccine QUAD High Dose(Fluad)  The entirety of the information documented in the History of Present Illness, Review of Systems and Physical Exam were personally obtained by me. Portions of this information were initially documented by Idelle Jo, CMA and reviewed by me for thoroughness and accuracy.     Lelon Huh, MD  [facility]

## 2019-11-07 ENCOUNTER — Other Ambulatory Visit: Payer: Self-pay

## 2019-11-07 NOTE — Patient Outreach (Signed)
[facility]) Care Management  11/07/2019  Lindsay Russo 07-10-1946 [id number]   Medication Adherence call to Lindsay Russo [facility] Identifiers Verify spoke with patient she is past due on Lovastatin 40 mg,patient explain she takes 1 tablet daily and has 15 more days and then she will order. Lindsay Russo is showing past due under [place].   [facility] Management Direct Dial [phone]  Fax [phone] [email or web address]

## 2019-11-14 ENCOUNTER — Encounter: Payer: Self-pay | Admitting: Family Medicine

## 2019-11-14 ENCOUNTER — Ambulatory Visit (INDEPENDENT_AMBULATORY_CARE_PROVIDER_SITE_OTHER): Payer: Medicare Other | Admitting: Family Medicine

## 2019-11-14 ENCOUNTER — Other Ambulatory Visit: Payer: Self-pay

## 2019-11-14 VITALS — BP 138/62 | HR 69 | Temp 97.3°F | Resp 16 | Wt 200.0 lb

## 2019-11-14 DIAGNOSIS — I10 Essential (primary) hypertension: Secondary | ICD-10-CM

## 2019-11-14 DIAGNOSIS — M778 Other enthesopathies, not elsewhere classified: Secondary | ICD-10-CM | POA: Diagnosis not present

## 2019-11-14 NOTE — Patient Outreach (Signed)
[facility]) Care Management  11/14/2019  Lindsay Russo 1946-07-30 [id number]   Medication Adherence call to Lindsay Russo Telephone call to Patient regarding Medication Adherence unable to reach patient. Lindsay Russo is showing past due on Lovastatin 40 mg under [name].   [name] Management Direct Dial [phone]  Fax [phone] [name]

## 2019-11-14 NOTE — Progress Notes (Signed)
Patient: Lindsay Russo Female    DOB: July 05, 1946   73 y.o.   MRN: [id number] Visit Date: 11/14/2019  Today's Provider: Lelon Huh, MD   Chief Complaint  Patient presents with  . Hypertension   Subjective:     HPI  Hypertension, follow-up:  BP Readings from Last 3 Encounters:  11/14/19 138/62  10/24/19 (!) 164/72  04/18/19 134/74    She was last seen for hypertension 3 weeks ago.  BP at that visit was 164/72. Management since that visit includes adding Spironolactone 25mg  daily. Patient was advised to follow up in 3 weeks to recheck blood pressure and labs. She reports good compliance with treatment. She is not having side effects.  She is exercising. She is adherent to low salt diet.   Outside blood pressures are 140/70.  Patient denies chest pain, dyspnea, irregular heart beat, orthopnea and palpitations.       Weight trend: stable Wt Readings from Last 3 Encounters:  11/14/19 200 lb (90.7 kg)  10/24/19 202 lb (91.6 kg)  04/18/19 209 lb 3.2 oz (94.9 kg)    Current diet: in general, a "healthy" diet    ------------------------------------------------------------------------  Still having pains in fingers. Using Voltaren. Gel which is working well.  Allergies  Allergen Reactions  . Tegaderm Ag Mesh [Silver] Other (See Comments)    blisters  . Amoxicillin Hives and Rash    Has patient had a PCN reaction causing immediate rash, facial/tongue/throat swelling, SOB or lightheadedness with hypotension: Yes Has patient had a PCN reaction causing severe rash involving mucus membranes or skin necrosis: Yes Has patient had a PCN reaction that required hospitalization No Has patient had a PCN reaction occurring within the last 10 years: Yes If all of the above answers are "NO", then may proceed with Cephalosporin use.   . Chlorthalidone Rash  . Sulfa Antibiotics Rash     Current Outpatient Medications:  .  acetaminophen (TYLENOL) 500 MG tablet, Take  1,000 mg by mouth every 6 (six) hours as needed for moderate pain or headache., Disp: , Rfl:  .  amLODipine (NORVASC) 10 MG tablet, TAKE 1 TABLET BY MOUTH ONCE DAILY., Disp: 90 tablet, Rfl: 4 .  diclofenac Sodium (VOLTAREN) 1 % GEL, Apply topically 4 (four) times daily., Disp: , Rfl:  .  lovastatin (MEVACOR) 40 MG tablet, TAKE 1 TABLET BY MOUTH AT BEDTIME., Disp: 30 tablet, Rfl: 12 .  meclizine (ANTIVERT) 25 MG tablet, Take 1 tablet (25 mg total) by mouth 3 (three) times daily as needed for dizziness., Disp: 30 tablet, Rfl: 0 .  promethazine (PHENERGAN) 25 MG tablet, Take 1 tablet (25 mg total) by mouth every 8 (eight) hours as needed for nausea or vomiting., Disp: 20 tablet, Rfl: 0 .  spironolactone (ALDACTONE) 25 MG tablet, Take 1 tablet (25 mg total) by mouth every morning., Disp: 30 tablet, Rfl: 0 .  traMADol (ULTRAM) 50 MG tablet, Take 1 tablet (50 mg total) by mouth every 6 (six) hours as needed., Disp: 30 tablet, Rfl: 4  Review of Systems  Constitutional: Negative for appetite change, chills, fatigue and fever.  Respiratory: Negative for chest tightness and shortness of breath.   Cardiovascular: Negative for chest pain and palpitations.  Gastrointestinal: Negative for abdominal pain, nausea and vomiting.  Musculoskeletal: Positive for arthralgias (in fingers) and joint swelling (in fingers).  Neurological: Negative for dizziness and weakness.    Social History   Tobacco Use  . Smoking status: Never Smoker  .  Smokeless tobacco: Never Used  Substance Use Topics  . Alcohol use: No      Objective:   BP 138/62 (BP Location: Right Arm, Cuff Size: Large)   Pulse 69   Temp (!) 97.3 F (36.3 C) (Temporal)   Resp 16   Wt 200 lb (90.7 kg)   SpO2 97% Comment: room air  BMI 32.28 kg/m  Vitals:   11/14/19 1021 11/14/19 1025  BP: (!) 144/68 138/62  Pulse: 69   Resp: 16   Temp: (!) 97.3 F (36.3 C)   TempSrc: Temporal   SpO2: 97%   Weight: 200 lb (90.7 kg)   Body mass index is  32.28 kg/m.   Physical Exam  General appearance: Well developed, well nourished female, cooperative and in no acute distress Head: Normocephalic, without obvious abnormality, atraumatic Respiratory: Respirations even and unlabored, normal respiratory rate Extremities: All extremities are intact.  Skin: Skin color, texture, turgor normal. No rashes seen  Psych: Appropriate mood and affect. Neurologic: Mental status: Alert, oriented to person, place, and time, thought content appropriate.  No results found for any visits on 11/14/19.     Assessment & Plan    1. Thumb tendonitis Continue OTC Voltaren [place]  2. Essential hypertension Improved with addition of spironolactone. Continue current medications.   - Renal Function Panel  The entirety of the information documented in the History of Present Illness, Review of Systems and Physical Exam were personally obtained by me. Portions of this information were initially documented by Meyer Cory, CMA and reviewed by me for thoroughness and accuracy.      Lelon Huh, MD  [facility]

## 2019-11-15 LAB — RENAL FUNCTION PANEL
Albumin: 4.6 g/dL (ref 3.7–4.7)
BUN/Creatinine Ratio: 14 (ref 12–28)
BUN: 16 mg/dL (ref 8–27)
CO2: 24 mmol/L (ref 20–29)
Calcium: 10.1 mg/dL (ref 8.7–10.3)
Chloride: 102 mmol/L (ref 96–106)
Creatinine, Ser: 1.13 mg/dL — ABNORMAL HIGH (ref 0.57–1.00)
GFR calc Af Amer: 56 mL/min/{1.73_m2} — ABNORMAL LOW (ref >59)
GFR calc non Af Amer: 48 mL/min/{1.73_m2} — ABNORMAL LOW (ref >59)
Glucose: 135 mg/dL — ABNORMAL HIGH (ref 65–99)
Phosphorus: 4.4 mg/dL — ABNORMAL HIGH (ref 3.0–4.3)
Potassium: 4.3 mmol/L (ref 3.5–5.2)
Sodium: 141 mmol/L (ref 134–144)

## 2019-11-27 ENCOUNTER — Other Ambulatory Visit: Payer: Self-pay | Admitting: Family Medicine

## 2019-11-27 DIAGNOSIS — I10 Essential (primary) hypertension: Secondary | ICD-10-CM

## 2019-12-25 ENCOUNTER — Other Ambulatory Visit: Payer: Self-pay | Admitting: Family Medicine

## 2019-12-25 DIAGNOSIS — I10 Essential (primary) hypertension: Secondary | ICD-10-CM

## 2019-12-25 NOTE — Telephone Encounter (Signed)
L.O.V. was on 11/14/2019 and a upcoming appointment schedule for 04/30/2020.

## 2019-12-25 NOTE — Telephone Encounter (Signed)
Requested medication (s) are due for refill today: yes  Requested medication (s) are on the active medication list: yes  Last refill:  11/27/2019  Future visit scheduled: yes  Notes to clinic:  cardiovascular: diuretics - Aldosterone Antagonist Failed  Requested Prescriptions  Pending Prescriptions Disp Refills   spironolactone (ALDACTONE) 25 MG tablet [Pharmacy Med Name: SPIRONOLACTONE 25 MG TAB] 30 tablet 0    Sig: TAKE 1 TABLET BY MOUTH ONCE EVERY MORNING      Cardiovascular: Diuretics - Aldosterone Antagonist Failed - 12/25/2019 11:23 AM      Failed - Cr in normal range and within 360 days    Creatinine  Date Value Ref Range Status  05/04/2013 1.12 0.60 - 1.30 mg/dL Final   Creatinine, Ser  Date Value Ref Range Status  11/14/2019 1.13 (H) 0.57 - 1.00 mg/dL Final          Passed - K in normal range and within 360 days    Potassium  Date Value Ref Range Status  11/14/2019 4.3 3.5 - 5.2 mmol/L Final  05/04/2013 3.3 (L) 3.5 - 5.1 mmol/L Final          Passed - Na in normal range and within 360 days    Sodium  Date Value Ref Range Status  11/14/2019 141 134 - 144 mmol/L Final  05/04/2013 142 136 - 145 mmol/L Final          Passed - Last BP in normal range    BP Readings from Last 1 Encounters:  11/14/19 138/62          Passed - Valid encounter within last 6 months    Recent Outpatient Visits           1 month ago Thumb tendonitis   [facility] Birdie Sons, MD   2 months ago [place], Donald E, MD   8 months ago Annual physical exam   [facility] Birdie Sons, MD   11 months ago Sciatica of left side   [facility] Birdie Sons, MD   1 year ago Rash due to allergy   [facility] Birdie Sons, MD

## 2020-01-29 ENCOUNTER — Other Ambulatory Visit: Payer: Self-pay | Admitting: Family Medicine

## 2020-01-29 DIAGNOSIS — I10 Essential (primary) hypertension: Secondary | ICD-10-CM

## 2020-01-29 NOTE — Telephone Encounter (Signed)
Requested Prescriptions  Pending Prescriptions Disp Refills  . spironolactone (ALDACTONE) 25 MG tablet [Pharmacy Med Name: SPIRONOLACTONE 25 MG TAB] 30 tablet 0    Sig: TAKE 1 TABLET BY MOUTH ONCE EVERY MORNING     Cardiovascular: Diuretics - Aldosterone Antagonist Failed - 01/29/2020  8:23 AM      Failed - Cr in normal range and within 360 days    Creatinine  Date Value Ref Range Status  05/04/2013 1.12 0.60 - 1.30 mg/dL Final   Creatinine, Ser  Date Value Ref Range Status  11/14/2019 1.13 (H) 0.57 - 1.00 mg/dL Final         Passed - K in normal range and within 360 days    Potassium  Date Value Ref Range Status  11/14/2019 4.3 3.5 - 5.2 mmol/L Final  05/04/2013 3.3 (L) 3.5 - 5.1 mmol/L Final         Passed - Na in normal range and within 360 days    Sodium  Date Value Ref Range Status  11/14/2019 141 134 - 144 mmol/L Final  05/04/2013 142 136 - 145 mmol/L Final         Passed - Last BP in normal range    BP Readings from Last 1 Encounters:  11/14/19 138/62         Passed - Valid encounter within last 6 months    Recent Outpatient Visits          2 months ago Thumb tendonitis   [facility] Birdie Sons, MD   3 months ago Pre-diabetes   [facility] Birdie Sons, MD   9 months ago Annual physical exam   [facility] Birdie Sons, MD   1 year ago Sciatica of left side   [facility] Birdie Sons, MD   1 year ago Rash due to allergy   [place], Kirstie Peri, MD

## 2020-03-18 DIAGNOSIS — L821 Other seborrheic keratosis: Secondary | ICD-10-CM | POA: Diagnosis not present

## 2020-03-18 DIAGNOSIS — D692 Other nonthrombocytopenic purpura: Secondary | ICD-10-CM | POA: Diagnosis not present

## 2020-03-18 DIAGNOSIS — B372 Candidiasis of skin and nail: Secondary | ICD-10-CM | POA: Diagnosis not present

## 2020-03-18 DIAGNOSIS — L578 Other skin changes due to chronic exposure to nonionizing radiation: Secondary | ICD-10-CM | POA: Diagnosis not present

## 2020-04-24 NOTE — Progress Notes (Signed)
Subjective:   Lindsay Russo is a 74 y.o. female who presents for Medicare Annual (Subsequent) preventive examination.  Review of Systems:  N/A  Cardiac Risk Factors include: advanced age (>67men, >92 women);hypertension;obesity (BMI >30kg/m2);dyslipidemia     Objective:     Vitals: BP (!) 130/50 (BP Location: Right Arm)   Temp (!) 97.5 F (36.4 C) (Tympanic)   Ht 5\' 6"  (1.676 m)   Wt 203 lb 7.2 oz (92.3 kg)   BMI 32.84 kg/m   Body mass index is 32.84 kg/m.  Advanced Directives 04/30/2020 01/28/2018 10/26/2016 10/15/2016  Does Patient Have a Medical Advance Directive? No No No No  Would patient like information on creating a medical advance directive? No - Patient declined No - Patient declined No - Patient declined No - patient declined information    Tobacco Social History   Tobacco Use  Smoking Status Never Smoker  Smokeless Tobacco Never Used     Counseling given: Not Answered   Clinical Intake:  Pre-visit preparation completed: Yes  Pain : No/denies pain Pain Score: 0-No pain     Nutritional Status: BMI > 30  Obese Nutritional Risks: None Diabetes: No(Prediabetic)  How often do you need to have someone help you when you read instructions, pamphlets, or other written materials from your doctor or pharmacy?: 1 - Never  Interpreter Needed?: No  Information entered by :: [facility], LPN  Past Medical History:  Diagnosis Date  . History of kidney stones   . LCIS September 05, 2013   Left breast, ER 90%, PR 60%  . Malignant neoplasm of ascending colon [facility]) November 2007   T2, N0.5.2 cm low-grade adenocarcinoma. 0/28 nodes positive.  . Personal history of colonic polyps   . Spinal headache 1995   at [facility] spinal made me feel dead, had no control.  . Stroke ([facility]) 09/17/2010   Right thalamic without residual effects  . Tubulovillous adenoma polyp of colon 10/06/2017   2 polyps, 5 and 10 mm completely resected from the area of the ileocolic anastomosis.   Tubulovillous polyp with high-grade dysplasia.   Past Surgical History:  Procedure Laterality Date  . APPENDECTOMY  1995  . BREAST BIOPSY Right August 07, 2010   Sclerotic fibroadenoma removed from the right breast at the 10:00 position  . BREAST BIOPSY Left 08/14/2013   left breast superior LCIS. per path report: Pleomorphic LCIS has similar behavior to DCIS.  [name] BREAST BIOPSY Left 08/14/2013   left breast superior fibroadenoma  . BREAST BIOPSY Left 08/23/2017   stereo bx with Dr. Bary Castilla of lower inner quad. FIBROADENOMATOID STROMAL FIBROSIS WITH ASSOCIATED CALCIFICATIONS.   [name] BREAST BIOPSY    . BREAST LUMPECTOMY Left 09/05/2013   clear margins  . BREAST SURGERY Left 09/05/13   LCIS, ER 90%, PR 60%, negative margins.  . COLONOSCOPY  August 03, 2012   8 mm tubulovillous adenoma removed from the transverse colon. Followup exam in 2018 plan.  . COLONOSCOPY WITH PROPOFOL N/A 10/06/2017   Procedure: COLONOSCOPY WITH PROPOFOL;  Surgeon: Robert Bellow, MD;  Location: [facility] ENDOSCOPY;  Service: Endoscopy;  Laterality: N/A;  . DOPPLER ECHOCARDIOGRAPHY  09/17/2010   EF 55%, Mild to moderate MR and mild to moderate TR  . HEEL SPUR EXCISION Left 2005  . JOINT REPLACEMENT Right 2012  . KNEE ARTHROPLASTY Left 10/26/2016   Procedure: COMPUTER ASSISTED TOTAL KNEE ARTHROPLASTY;  Surgeon: Dereck Leep, MD;  Location: [facility] ORS;  Service: Orthopedics;  Laterality: Left;  . LUMBAR DISC  SURGERY  2009   L4-5 microdiscectomy Dr. Mauri Pole  . OVARIAN CYST REMOVAL  1980  . PARTIAL COLECTOMY Right 2007   Dr. Bary Castilla  . REPLACEMENT TOTAL KNEE Right 12/2010   Family History  Problem Relation Age of Onset  . Gastric cancer Father   . Brain cancer Father   . Hypertension Sister   . Heart disease Other   . Ulcers Other   . Epilepsy Other    Social History   Socioeconomic History  . Marital status: Widowed    Spouse name: Not on file  . Number of children: 2  . Years of education: Not on file    . Highest education level: Associate degree: academic program  Occupational History  . Occupation: Retired  Tobacco Use  . Smoking status: Never Smoker  . Smokeless tobacco: Never Used  Substance and Sexual Activity  . Alcohol use: No  . Drug use: No  . Sexual activity: Not on file  Other Topics Concern  . Not on file  Social History Narrative  . Not on file   Social Determinants of Health   Financial Resource Strain: Low Risk   . Difficulty of Paying Living Expenses: Not hard at all  Food Insecurity: No Food Insecurity  . Worried About Charity fundraiser in the Last Year: Never true  . Ran Out of Food in the Last Year: Never true  Transportation Needs: No Transportation Needs  . Lack of Transportation (Medical): No  . Lack of Transportation (Non-Medical): No  Physical Activity: Sufficiently Active  . Days of Exercise per Week: 5 days  . Minutes of Exercise per Session: 30 min  Stress: No Stress Concern Present  . Feeling of Stress : Only a little  Social Connections: Moderately Isolated  . Frequency of Communication with Friends and Family: More than three times a week  . Frequency of Social Gatherings with Friends and Family: More than three times a week  . Attends Religious Services: Never  . Active Member of Clubs or Organizations: No  . Attends Archivist Meetings: Never  . Marital Status: Widowed    Outpatient Encounter Medications as of 04/30/2020  Medication Sig  . acetaminophen (TYLENOL) 500 MG tablet Take 1,000 mg by mouth every 6 (six) hours as needed for moderate pain or headache.  [name] amLODipine (NORVASC) 10 MG tablet TAKE 1 TABLET BY MOUTH ONCE DAILY.  [name] diclofenac Sodium (VOLTAREN) 1 % GEL Apply topically 4 (four) times daily.  [name] lovastatin (MEVACOR) 40 MG tablet TAKE 1 TABLET BY MOUTH AT BEDTIME.  . meclizine (ANTIVERT) 25 MG tablet Take 1 tablet (25 mg total) by mouth 3 (three) times daily as needed for dizziness.  . promethazine (PHENERGAN) 25  MG tablet Take 1 tablet (25 mg total) by mouth every 8 (eight) hours as needed for nausea or vomiting.  [name] spironolactone (ALDACTONE) 25 MG tablet TAKE 1 TABLET BY MOUTH ONCE EVERY MORNING  . traMADol (ULTRAM) 50 MG tablet Take 1 tablet (50 mg total) by mouth every 6 (six) hours as needed.   No facility-administered encounter medications on file as of 04/30/2020.    Activities of Daily Living In your present state of health, do you have any difficulty performing the following activities: 04/30/2020  Hearing? N  Vision? N  Difficulty concentrating or making decisions? N  Walking or climbing stairs? N  Dressing or bathing? N  Doing errands, shopping? N  Preparing Food and eating ? N  Using the Toilet? N  In the past six months, have you accidently leaked urine? Y  Comment Occasionally with pressure.  Do you have problems with loss of bowel control? N  Managing your Medications? N  Managing your Finances? N  Housekeeping or managing your Housekeeping? N  Some recent data might be hidden    Patient Care Team: Birdie Sons, MD as PCP - General (Family Medicine) Byrnett, Forest Gleason, MD (General Surgery) Anell Barr, OD as Consulting Physician (Optometry) Marry Guan, Laurice Record, MD as Consulting Physician (Orthopedic Surgery) Jannet Mantis, MD (Dermatology)    Assessment:   This is a routine wellness examination for [place]ola.  Exercise Activities and Dietary recommendations Current Exercise Habits: Home exercise routine, Type of exercise: walking, Time (Minutes): 30(to 45 minutes), Frequency (Times/Week): 5, Weekly Exercise (Minutes/Week): 150, Intensity: Mild, Exercise limited by: None identified  Goals    . DIET - REDUCE SUGAR INTAKE     Recommend to cut back on sweets and dessert in diet to help aid with weight loss.        Fall Risk: Fall Risk  04/30/2020 04/18/2019 01/28/2018 01/25/2017 01/25/2017  Falls in the past year? 0 0 No No No  Number falls in past yr: 0 0 - - -    Injury with Fall? 0 0 - - -    FALL RISK PREVENTION PERTAINING TO THE HOME:  Any stairs in or around the home? Yes  If so, are there any without handrails? No   Home free of loose throw rugs in walkways, pet beds, electrical cords, etc? Yes  Adequate lighting in your home to reduce risk of falls? Yes   ASSISTIVE DEVICES UTILIZED TO PREVENT FALLS:  Life alert? No  Use of a cane, walker or w/c? No  Grab bars in the bathroom? Yes  Shower chair or bench in shower? Yes  Elevated toilet seat or a handicapped toilet? Yes    TIMED UP AND GO:  Was the test performed? No .    Depression Screen PHQ 2/9 Scores 04/30/2020 04/18/2019 01/28/2018 01/28/2018  PHQ - 2 Score 0 0 0 0  PHQ- 9 Score - 2 2 -     Cognitive Function     6CIT Screen 04/30/2020  What Year? 0 points  What month? 0 points  What time? 0 points  Count back from 20 0 points  Months in reverse 0 points  Repeat phrase 0 points  Total Score 0    Immunization History  Administered Date(s) Administered  . Fluad Quad(high Dose 65+) 10/24/2019  . Influenza, High Dose Seasonal PF 12/21/2016, 10/04/2017, 08/22/2018  . PFIZER SARS-COV-2 Vaccination 12/21/2019, 01/15/2020  . Pneumococcal Conjugate-13 11/12/2014  . Pneumococcal Polysaccharide-23 01/11/2012  . Tdap 04/06/2007    Qualifies for Shingles Vaccine? Yes . Due for Shingrix. Pt has been advised to call insurance company to determine out of pocket expense. Advised may also receive vaccine at local pharmacy or Health Dept. Verbalized acceptance and understanding.  Tdap: Although this vaccine is not a covered service during a Wellness Exam, does the patient still wish to receive this vaccine today?  No . Advised may receive this vaccine at local pharmacy or Health Dept. Aware to provide a copy of the vaccination record if obtained from local pharmacy or Health Dept. Verbalized acceptance and understanding.  Flu Vaccine: Up to date  Pneumococcal Vaccine: Completed  series  Screening Tests Health Maintenance  Topic Date Due  . TETANUS/TDAP  04/30/2021 (Originally 04/05/2017)  . INFLUENZA VACCINE  06/30/2020  .  MAMMOGRAM  09/11/2020  . DEXA SCAN  11/27/2020  . COLONOSCOPY  10/06/2022  . COVID-19 Vaccine  Completed  . Hepatitis C Screening  Completed  . PNA vac Low Risk Adult  Completed    Cancer Screenings:  Colorectal Screening: Completed 10/06/17. Repeat every 5 years.   Mammogram: Completed 09/12/19. Repeat every 1-2 years as advised.   Bone Density: Completed 11/28/15. Results reflect NORMAL. Repeat every 5 years as advised.   Lung Cancer Screening: (Low Dose CT Chest recommended if Age 8-80 years, 30 pack-year currently smoking OR have quit w/in 15years.) does not qualify.   Additional Screening:  Hepatitis C Screening: Up to date  Vision Screening: Recommended annual ophthalmology exams for early detection of glaucoma and other disorders of the eye.  Dental Screening: Recommended annual dental exams for proper oral hygiene  Community Resource Referral:  CRR required this visit? No     Plan:  I have personally reviewed and addressed the Medicare Annual Wellness questionnaire and have noted the following in the patient's chart:  A. Medical and social history B. Use of alcohol, tobacco or illicit drugs  C. Current medications and supplements D. Functional ability and status E.  Nutritional status F.  Physical activity G. Advance directives H. List of other physicians I.  Hospitalizations, surgeries, and ER visits in previous 12 months J.  [place] such as hearing and vision if needed, cognitive and depression L. Referrals and appointments   In addition, I have reviewed and discussed with patient certain preventive protocols, quality metrics, and best practice recommendations. A written personalized care plan for preventive services as well as general preventive health recommendations were provided to  patient. Nurse Health Advisor  Signed,    Deshanna Kama [place], [place]  [id number] Nurse Health Advisor   Nurse Notes: None.

## 2020-04-26 NOTE — Progress Notes (Signed)
I,Lindsay Russo,acting as a scribe for Lindsay Huh, MD.,have documented all relevant documentation on the behalf of Lindsay Huh, MD,as directed by  Lindsay Huh, MD while in the presence of Lindsay Huh, MD.  Complete physical exam   Patient: Lindsay Russo   DOB: September 10, 1946   75 y.o. Female  MRN: [id number] Visit Date: 04/30/2020  Today's healthcare provider: Lelon Huh, MD   No chief complaint on file.  Subjective    Lindsay Russo is a 74 y.o. female who presents today for a complete physical exam.  She reports consuming a general diet. Home exercise routine includes walking. She generally feels fairly well. She reports sleeping fairly well. She does have additional problems to discuss today.  Last Colonoscopy: 10/06/2017 Last Mammogram: 09/12/2019 BI-RADS CAT2   Has AVW with [facility] today at 1:20pm.   HPI  Hypertension, follow-up  BP Readings from Last 3 Encounters:  11/14/19 138/62  10/24/19 (!) 164/72  04/18/19 134/74   Wt Readings from Last 3 Encounters:  11/14/19 200 lb (90.7 kg)  10/24/19 202 lb (91.6 kg)  04/18/19 209 lb 3.2 oz (94.9 kg)     She was last seen for hypertension 5 months ago.  BP at that visit was 138/62. Management since that visit includes continuing same medication.  She reports good compliance with treatment. She is not having side effects.  She is following a Regular diet. She is exercising. She does not smoke.  Use of agents associated with hypertension: none.   Outside blood pressures are 140/70. Symptoms: No chest pain No chest pressure  No palpitations No syncope  No dyspnea No orthopnea  No paroxysmal nocturnal dyspnea No lower extremity edema   Pertinent labs: Lab Results  Component Value Date   CHOL 162 04/18/2019   HDL 45 04/18/2019   LDLCALC 83 04/18/2019   TRIG 171 (H) 04/18/2019   CHOLHDL 3.6 04/18/2019   Lab Results  Component Value Date   NA 141 11/14/2019   K 4.3 11/14/2019   CREATININE 1.13  (H) 11/14/2019   GFRNONAA 48 (L) 11/14/2019   GFRAA 56 (L) 11/14/2019   GLUCOSE 135 (H) 11/14/2019     The 10-year ASCVD risk score Lindsay Bussing DC Jr., et al., 2013) is: 21.3%   ---------------------------------------------------------------------------------------------------  Prediabetes, Follow-up  Lab Results  Component Value Date   HGBA1C 5.7 (A) 10/24/2019   HGBA1C 5.8 (H) 04/18/2019   HGBA1C 6.3 (H) 01/28/2018   GLUCOSE 135 (H) 11/14/2019   GLUCOSE 118 (H) 04/18/2019   GLUCOSE 105 (H) 07/25/2018    Last seen for for this6 months ago.  Management since that visit includes no changes. Current symptoms include none and have been stable.  Prior visit with dietician: no Current diet: well balanced Current exercise: walking  Pertinent Labs:    Component Value Date/Time   CHOL 162 04/18/2019 0925   TRIG 171 (H) 04/18/2019 0925   CHOLHDL 3.6 04/18/2019 0925   CREATININE 1.13 (H) 11/14/2019 1056   CREATININE 1.12 05/04/2013 2326    Wt Readings from Last 3 Encounters:  11/14/19 200 lb (90.7 kg)  10/24/19 202 lb (91.6 kg)  04/18/19 209 lb 3.2 oz (94.9 kg)    -----------------------------------------------------------------------------------------  Follow up for Chronic low back pain:  The patient was last seen for this 6 months ago. Changes made at last visit include none; continue Tramadol as needed.  She reports good compliance with treatment. She feels that condition is Improved. She is not having side effects.   -----------------------------------------------------------------------------------------  Lipid/Cholesterol, Follow-up  Last lipid panel Other pertinent labs  Lab Results  Component Value Date   CHOL 162 04/18/2019   HDL 45 04/18/2019   LDLCALC 83 04/18/2019   TRIG 171 (H) 04/18/2019   CHOLHDL 3.6 04/18/2019   Lab Results  Component Value Date   ALT 12 04/18/2019   AST 20 04/18/2019   PLT 233 04/18/2019   TSH 2.970 07/25/2018     She was  last seen for this 1 years ago.  Management since that visit includes continue same medication.  She reports good compliance with treatment. She is not having side effects.   Symptoms: No chest pain No chest pressure/discomfort  No dyspnea No lower extremity edema  No numbness or tingling of extremity No orthopnea  No palpitations No paroxysmal nocturnal dyspnea  No speech difficulty No syncope   Current diet: well balanced Current exercise: walking  The 10-year ASCVD risk score Lindsay Russo., et al., 2013) is: 21.3%  ---------------------------------------------------------------------------------------------------   Past Medical History:  Diagnosis Date  . History of kidney stones   . LCIS September 05, 2013   Left breast, ER 90%, PR 60%  . Malignant neoplasm of ascending colon [facility]) November 2007   T2, N0.5.2 cm low-grade adenocarcinoma. 0/28 nodes positive.  . Personal history of colonic polyps   . Spinal headache 1995   at [facility] spinal made me feel dead, had no control.  . Stroke ([place]) 09/17/2010   Right thalamic without residual effects  . Tubulovillous adenoma polyp of colon 10/06/2017   2 polyps, 5 and 10 mm completely resected from the area of the ileocolic anastomosis.  Tubulovillous polyp with high-grade dysplasia.   Past Surgical History:  Procedure Laterality Date  . APPENDECTOMY  1995  . BREAST BIOPSY Right August 07, 2010   Sclerotic fibroadenoma removed from the right breast at the 10:00 position  . BREAST BIOPSY Left 08/14/2013   left breast superior LCIS. per path report: Pleomorphic LCIS has similar behavior to DCIS.  [name] BREAST BIOPSY Left 08/14/2013   left breast superior fibroadenoma  . BREAST BIOPSY Left 08/23/2017   stereo bx with Dr. Bary Castilla of lower inner quad. FIBROADENOMATOID STROMAL FIBROSIS WITH ASSOCIATED CALCIFICATIONS.   [name] BREAST BIOPSY    . BREAST LUMPECTOMY Left 09/05/2013   clear margins  . BREAST SURGERY Left 09/05/13   LCIS, ER 90%,  PR 60%, negative margins.  . COLONOSCOPY  August 03, 2012   8 mm tubulovillous adenoma removed from the transverse colon. Followup exam in 2018 plan.  . COLONOSCOPY WITH PROPOFOL N/A 10/06/2017   Procedure: COLONOSCOPY WITH PROPOFOL;  Surgeon: Robert Bellow, MD;  Location: [facility] ENDOSCOPY;  Service: Endoscopy;  Laterality: N/A;  . DOPPLER ECHOCARDIOGRAPHY  09/17/2010   EF 55%, Mild to moderate MR and mild to moderate TR  . HEEL SPUR EXCISION Left 2005  . JOINT REPLACEMENT Right 2012  . KNEE ARTHROPLASTY Left 10/26/2016   Procedure: COMPUTER ASSISTED TOTAL KNEE ARTHROPLASTY;  Surgeon: Dereck Leep, MD;  Location: [facility] ORS;  Service: Orthopedics;  Laterality: Left;  . LUMBAR DISC SURGERY  2009   L4-5 microdiscectomy Dr. Mauri Pole  . OVARIAN CYST REMOVAL  1980  . PARTIAL COLECTOMY Right 2007   Dr. Bary Castilla  . REPLACEMENT TOTAL KNEE Right 12/2010   Social History   Socioeconomic History  . Marital status: Married    Spouse name: Not on file  . Number of children: 2  . Years of education: Not on file  . Highest  education level: Associate degree: academic program  Occupational History  . Occupation: Retired  Tobacco Use  . Smoking status: Never Smoker  . Smokeless tobacco: Never Used  Substance and Sexual Activity  . Alcohol use: No  . Drug use: No  . Sexual activity: Not on file  Other Topics Concern  . Not on file  Social History Narrative  . Not on file   Social Determinants of Health   Financial Resource Strain:   . Difficulty of Paying Living Expenses:   Food Insecurity:   . Worried About Charity fundraiser in the Last Year:   . Arboriculturist in the Last Year:   Transportation Needs:   . Film/video editor (Medical):   [name] Lack of Transportation (Non-Medical):   Physical Activity:   . Days of Exercise per Week:   . Minutes of Exercise per Session:   Stress:   . Feeling of Stress :   Social Connections:   . Frequency of Communication with Friends and  Family:   . Frequency of Social Gatherings with Friends and Family:   . Attends Religious Services:   . Active Member of Clubs or Organizations:   . Attends Archivist Meetings:   [name] Marital Status:   Intimate Partner Violence:   . Fear of Current or Ex-Partner:   . Emotionally Abused:   [name] Physically Abused:   . Sexually Abused:    Family Status  Relation Name Status  . Father  Deceased  . Mother  Deceased       died from sepsis  . Sister  Alive  . Other  (Not Specified)   Family History  Problem Relation Age of Onset  . Gastric cancer Father   . Brain cancer Father   . Hypertension Sister   . Heart disease Other   . Ulcers Other   . Epilepsy Other    Allergies  Allergen Reactions  . Tegaderm Ag Mesh [Silver] Other (See Comments)    blisters  . Amoxicillin Hives and Rash    Has patient had a PCN reaction causing immediate rash, facial/tongue/throat swelling, SOB or lightheadedness with hypotension: Yes Has patient had a PCN reaction causing severe rash involving mucus membranes or skin necrosis: Yes Has patient had a PCN reaction that required hospitalization No Has patient had a PCN reaction occurring within the last 10 years: Yes If all of the above answers are "NO", then may proceed with Cephalosporin use.   . Chlorthalidone Rash  . Sulfa Antibiotics Rash    Patient Care Team: Birdie Sons, MD as PCP - General (Family Medicine) Bary Castilla, Forest Gleason, MD (General Surgery) Anell Barr, OD as Consulting Physician (Optometry) Marry Guan, Laurice Record, MD as Consulting Physician (Orthopedic Surgery)   Medications: Outpatient Medications Prior to Visit  Medication Sig  . acetaminophen (TYLENOL) 500 MG tablet Take 1,000 mg by mouth every 6 (six) hours as needed for moderate pain or headache.  [name] amLODipine (NORVASC) 10 MG tablet TAKE 1 TABLET BY MOUTH ONCE DAILY.  [name] diclofenac Sodium (VOLTAREN) 1 % GEL Apply topically 4 (four) times daily.  [name] lovastatin  (MEVACOR) 40 MG tablet TAKE 1 TABLET BY MOUTH AT BEDTIME.  . meclizine (ANTIVERT) 25 MG tablet Take 1 tablet (25 mg total) by mouth 3 (three) times daily as needed for dizziness.  . promethazine (PHENERGAN) 25 MG tablet Take 1 tablet (25 mg total) by mouth every 8 (eight) hours as needed for nausea or vomiting.  [name]  spironolactone (ALDACTONE) 25 MG tablet TAKE 1 TABLET BY MOUTH ONCE EVERY MORNING  . traMADol (ULTRAM) 50 MG tablet Take 1 tablet (50 mg total) by mouth every 6 (six) hours as needed.   No facility-administered medications prior to visit.    Review of Systems  Constitutional: Negative for chills, fatigue and fever.  HENT: Negative for congestion, ear pain, rhinorrhea, sneezing and sore throat.   Eyes: Negative.  Negative for pain and redness.  Respiratory: Negative for cough, shortness of breath and wheezing.   Cardiovascular: Negative for chest pain and leg swelling.  Gastrointestinal: Negative for abdominal pain, blood in stool, constipation, diarrhea and nausea.  Endocrine: Positive for cold intolerance (in feet) and heat intolerance (in feet). Negative for polydipsia and polyphagia.  Genitourinary: Negative.  Negative for dysuria, flank pain, hematuria, pelvic pain, vaginal bleeding and vaginal discharge.  Musculoskeletal: Positive for arthralgias and back pain. Negative for gait problem and joint swelling.  Skin: Negative for rash.  Neurological: Negative.  Negative for dizziness, tremors, seizures, weakness, light-headedness, numbness and headaches.  Hematological: Negative for adenopathy.  Psychiatric/Behavioral: Negative.  Negative for behavioral problems, confusion and dysphoric mood. The patient is not nervous/anxious and is not hyperactive.        Objective    There were no vitals taken for this visit.    BP 130/50 (BP Location: Right Arm)  Temp 97.5 F (36.4 C) (Tympanic)  Ht 5\' 6"  (1.676 m)  Wt 203 lb 7.2 oz (92.3 kg)  BMI 32.84 kg/m  BSA 2.07  m  Physical Exam   General Appearance:    Overweight female. Alert, cooperative, in no acute distress, appears stated age   Head:    Normocephalic, without obvious abnormality, atraumatic  Eyes:    PERRL, conjunctiva/corneas clear, EOM's intact, fundi    benign, both eyes  Ears:    Normal TM's and external ear canals, both ears  Neck:   Supple, symmetrical, trachea midline, no adenopathy;    thyroid:  no enlargement/tenderness/nodules; no carotid   bruit or JVD  Back:     Symmetric, no curvature, ROM normal, no CVA tenderness  Lungs:     Clear to auscultation bilaterally, respirations unlabored  Chest Wall:    No tenderness or deformity   Heart:    Normal heart rate. Normal rhythm. No murmurs, rubs, or gallops.   Breast Exam:    deferred  Abdomen:     Soft, non-tender, bowel sounds active all four quadrants,    no masses, no organomegaly  Pelvic:    deferred  Extremities:   All extremities are intact. No cyanosis or edema  Pulses:   2+ and symmetric all extremities  Skin:   Skin color, texture, turgor normal, no rashes or lesions  Lymph nodes:   Cervical, supraclavicular, and axillary nodes normal  Neurologic:   CNII-XII intact, normal strength, sensation and reflexes    throughout     Depression Screen  PHQ 2/9 Scores 04/18/2019 01/28/2018 01/28/2018  PHQ - 2 Score 0 0 0  PHQ- 9 Score 2 2 -    No results found for any visits on 04/30/20.  Assessment & Plan    Routine Health Maintenance and Physical Exam  Exercise Activities and Dietary recommendations Goals    . DIET - DECREASE SODA OR JUICE INTAKE     Recommend decreasing amount of soda intake to 1 daily instead of 2-3 a day.       Immunization History  Administered Date(s) Administered  . Fluad  Quad(high Dose 65+) 10/24/2019  . Influenza, High Dose Seasonal PF 12/21/2016, 10/04/2017, 08/22/2018  . Pneumococcal Conjugate-13 11/12/2014  . Pneumococcal Polysaccharide-23 01/11/2012  . Tdap 04/06/2007    Health  Maintenance  Topic Date Due  . COVID-19 Vaccine (1) Never done  . TETANUS/TDAP  04/05/2017  . INFLUENZA VACCINE  06/30/2020  . MAMMOGRAM  09/11/2020  . DEXA SCAN  11/27/2020  . COLONOSCOPY  10/06/2022  . Hepatitis C Screening  Completed  . PNA vac Low Risk Adult  Completed    Discussed health benefits of physical activity, and encouraged her to engage in regular exercise appropriate for her age and condition.  1. Annual physical exam   2. Aortic atherosclerosis (HCC) On statin.   3. Pre-diabetes  - Hemoglobin A1c  4. History of colon cancer and breast Ca Up to date on follow up colonoscopies and follow up with Dr. Bary Castilla   5. . Essential hypertension Well controlled.  Continue current medications.   - EKG 12-Lead - spironolactone (ALDACTONE) 25 MG tablet; TAKE 1 TABLET BY MOUTH ONCE EVERY MORNING  Dispense: 90 tablet; Refill: 0  6. Hyperlipidemia, mixed She is tolerating lovastatin well with no adverse effects.   - CBC - Comprehensive metabolic panel - Lipid panel  7. Vertigo Does well occasional- promethazine (PHENERGAN) 25 MG tablet; Take 1 tablet (25 mg total) by mouth every 8 (eight) hours as needed for nausea or vomiting.  Dispense: 20 tablet; Refill: 0 and - meclizine (ANTIVERT) 25 MG tablet; Take 1 tablet (25 mg total) by mouth 3 (three) times daily as needed for dizziness.  Dispense: 30 tablet; Refill: 0  8. Estrogen deficiency  - DG Bone Density; Future   No follow-ups on file.     The entirety of the information documented in the History of Present Illness, Review of Systems and Physical Exam were personally obtained by me. Portions of this information were initially documented by the CMA and reviewed by me for thoroughness and accuracy.      Lindsay Huh, MD  [facility] [phone] (phone) [phone] (fax)  [place]

## 2020-04-30 ENCOUNTER — Ambulatory Visit (INDEPENDENT_AMBULATORY_CARE_PROVIDER_SITE_OTHER): Payer: Medicare Other | Admitting: Family Medicine

## 2020-04-30 ENCOUNTER — Ambulatory Visit (INDEPENDENT_AMBULATORY_CARE_PROVIDER_SITE_OTHER): Payer: Medicare Other

## 2020-04-30 ENCOUNTER — Other Ambulatory Visit: Payer: Self-pay

## 2020-04-30 VITALS — BP 130/50 | Temp 97.5°F | Ht 66.0 in | Wt 203.4 lb

## 2020-04-30 DIAGNOSIS — E2839 Other primary ovarian failure: Secondary | ICD-10-CM

## 2020-04-30 DIAGNOSIS — I10 Essential (primary) hypertension: Secondary | ICD-10-CM

## 2020-04-30 DIAGNOSIS — I700 Atherosclerosis of aorta: Secondary | ICD-10-CM | POA: Diagnosis not present

## 2020-04-30 DIAGNOSIS — R7303 Prediabetes: Secondary | ICD-10-CM

## 2020-04-30 DIAGNOSIS — Z0000 Encounter for general adult medical examination without abnormal findings: Secondary | ICD-10-CM

## 2020-04-30 DIAGNOSIS — Z85038 Personal history of other malignant neoplasm of large intestine: Secondary | ICD-10-CM

## 2020-04-30 DIAGNOSIS — D692 Other nonthrombocytopenic purpura: Secondary | ICD-10-CM

## 2020-04-30 DIAGNOSIS — E782 Mixed hyperlipidemia: Secondary | ICD-10-CM | POA: Diagnosis not present

## 2020-04-30 DIAGNOSIS — R42 Dizziness and giddiness: Secondary | ICD-10-CM

## 2020-04-30 MED ORDER — SPIRONOLACTONE 25 MG PO TABS: ORAL_TABLET | ORAL | 0 refills | Status: DC

## 2020-04-30 MED ORDER — PROMETHAZINE HCL 25 MG PO TABS: 25.0000 mg | ORAL_TABLET | Freq: Three times a day (TID) | ORAL | 0 refills | Status: DC | PRN

## 2020-04-30 MED ORDER — MECLIZINE HCL 25 MG PO TABS: 25.0000 mg | ORAL_TABLET | Freq: Three times a day (TID) | ORAL | 0 refills | Status: AC | PRN

## 2020-04-30 NOTE — Patient Instructions (Signed)
.   Please review the attached list of medications and notify my office if there are any errors.   . Please bring all of your medications to every appointment so we can make sure that our medication list is the same as yours.    The CDC recommends two doses of Shingrix (the shingles vaccine) separated by 2 to 6 months for adults age 74 years and older. I recommend checking with your insurance plan regarding coverage for this vaccine.   

## 2020-04-30 NOTE — Patient Instructions (Signed)
Lindsay Russo , Thank you for taking time to come for your Medicare Wellness Visit. I appreciate your ongoing commitment to your health goals. Please review the following plan we discussed and let me know if I can assist you in the future.   Screening recommendations/referrals: Colonoscopy: Up to date, due 09/2022 Mammogram: Up to date, due 08/2021 Bone Density: Up to date, due 10/2020 Recommended yearly ophthalmology/optometry visit for glaucoma screening and checkup Recommended yearly dental visit for hygiene and checkup  Vaccinations: Influenza vaccine: Up to date Pneumococcal vaccine: Completed series Tdap vaccine: Pt declines today.  Shingles vaccine: Pt declines today.     Advanced directives: Advance directive discussed with you today. Even though you declined this today please call our office should you change your mind and we can give you the proper paperwork for you to fill out.  Conditions/risks identified: Recommend to cut back on sweets and dessert in diet to help aid with weight loss.   Next appointment: 2:00 PM today with Dr Caryn Section   Preventive Care 55 Years and Older, Female Preventive care refers to lifestyle choices and visits with your health care provider that can promote health and wellness. What does preventive care include?  A yearly physical exam. This is also called an annual well check.  Dental exams once or twice a year.  Routine eye exams. Ask your health care provider how often you should have your eyes checked.  Personal lifestyle choices, including:  Daily care of your teeth and gums.  Regular physical activity.  Eating a healthy diet.  Avoiding tobacco and drug use.  Limiting alcohol use.  Practicing safe sex.  Taking low-dose aspirin every day.  Taking vitamin and mineral supplements as recommended by your health care provider. What happens during an annual well check? The services and screenings done by your health care provider  during your annual well check will depend on your age, overall health, lifestyle risk factors, and family history of disease. Counseling  Your health care provider may ask you questions about your:  Alcohol use.  Tobacco use.  Drug use.  Emotional well-being.  Home and relationship well-being.  Sexual activity.  Eating habits.  History of falls.  Memory and ability to understand (cognition).  Work and work Statistician.  Reproductive health. Screening  You may have the following tests or measurements:  Height, weight, and BMI.  Blood pressure.  Lipid and cholesterol levels. These may be checked every 5 years, or more frequently if you are over 40 years old.  Skin check.  Lung cancer screening. You may have this screening every year starting at age 1 if you have a 30-pack-year history of smoking and currently smoke or have quit within the past 15 years.  Fecal occult blood test (FOBT) of the stool. You may have this test every year starting at age 6.  Flexible sigmoidoscopy or colonoscopy. You may have a sigmoidoscopy every 5 years or a colonoscopy every 10 years starting at age 47.  Hepatitis C blood test.  Hepatitis B blood test.  Sexually transmitted disease (STD) testing.  Diabetes screening. This is done by checking your blood sugar (glucose) after you have not eaten for a while (fasting). You may have this done every 1-3 years.  Bone density scan. This is done to screen for osteoporosis. You may have this done starting at age 62.  Mammogram. This may be done every 1-2 years. Talk to your health care provider about how often you should have regular mammograms.  Talk with your health care provider about your test results, treatment options, and if necessary, the need for more tests. Vaccines  Your health care provider may recommend certain vaccines, such as:  Influenza vaccine. This is recommended every year.  Tetanus, diphtheria, and acellular pertussis  (Tdap, Td) vaccine. You may need a Td booster every 10 years.  Zoster vaccine. You may need this after age 21.  Pneumococcal 13-valent conjugate (PCV13) vaccine. One dose is recommended after age 67.  Pneumococcal polysaccharide (PPSV23) vaccine. One dose is recommended after age 2. Talk to your health care provider about which screenings and vaccines you need and how often you need them. This information is not intended to replace advice given to you by your health care provider. Make sure you discuss any questions you have with your health care provider. Document Released: 12/13/2015 Document Revised: 08/05/2016 Document Reviewed: 09/17/2015 Elsevier Interactive Patient Education  2017 [place] Prevention in the Home Falls can cause injuries. They can happen to people of all ages. There are many things you can do to make your home safe and to help prevent falls. What can I do on the outside of my home?  Regularly fix the edges of walkways and driveways and fix any cracks.  Remove anything that might make you trip as you walk through a door, such as a raised step or threshold.  Trim any bushes or trees on the path to your home.  Use bright outdoor lighting.  Clear any walking paths of anything that might make someone trip, such as rocks or tools.  Regularly check to see if handrails are loose or broken. Make sure that both sides of any steps have handrails.  Any raised decks and porches should have guardrails on the edges.  Have any leaves, snow, or ice cleared regularly.  Use sand or salt on walking paths during winter.  Clean up any spills in your garage right away. This includes oil or grease spills. What can I do in the bathroom?  Use night lights.  Install grab bars by the toilet and in the tub and shower. Do not use towel bars as grab bars.  Use non-skid mats or decals in the tub or shower.  If you need to sit down in the shower, use a plastic, non-slip  stool.  Keep the floor dry. Clean up any water that spills on the floor as soon as it happens.  Remove soap buildup in the tub or shower regularly.  Attach bath mats securely with double-sided non-slip rug tape.  Do not have throw rugs and other things on the floor that can make you trip. What can I do in the bedroom?  Use night lights.  Make sure that you have a light by your bed that is easy to reach.  Do not use any sheets or blankets that are too big for your bed. They should not hang down onto the floor.  Have a firm chair that has side arms. You can use this for support while you get dressed.  Do not have throw rugs and other things on the floor that can make you trip. What can I do in the kitchen?  Clean up any spills right away.  Avoid walking on wet floors.  Keep items that you use a lot in easy-to-reach places.  If you need to reach something above you, use a strong step stool that has a grab bar.  Keep electrical cords out of the way.  Do not use floor polish or wax that makes floors slippery. If you must use wax, use non-skid floor wax.  Do not have throw rugs and other things on the floor that can make you trip. What can I do with my stairs?  Do not leave any items on the stairs.  Make sure that there are handrails on both sides of the stairs and use them. Fix handrails that are broken or loose. Make sure that handrails are as long as the stairways.  Check any carpeting to make sure that it is firmly attached to the stairs. Fix any carpet that is loose or worn.  Avoid having throw rugs at the top or bottom of the stairs. If you do have throw rugs, attach them to the floor with carpet tape.  Make sure that you have a light switch at the top of the stairs and the bottom of the stairs. If you do not have them, ask someone to add them for you. What else can I do to help prevent falls?  Wear shoes that:  Do not have high heels.  Have rubber bottoms.  Are  comfortable and fit you well.  Are closed at the toe. Do not wear sandals.  If you use a stepladder:  Make sure that it is fully opened. Do not climb a closed stepladder.  Make sure that both sides of the stepladder are locked into place.  Ask someone to hold it for you, if possible.  Clearly mark and make sure that you can see:  Any grab bars or handrails.  First and last steps.  Where the edge of each step is.  Use tools that help you move around (mobility aids) if they are needed. These include:  Canes.  Walkers.  Scooters.  Crutches.  Turn on the lights when you go into a dark area. Replace any light bulbs as soon as they burn out.  Set up your furniture so you have a clear path. Avoid moving your furniture around.  If any of your floors are uneven, fix them.  If there are any pets around you, be aware of where they are.  Review your medicines with your doctor. Some medicines can make you feel dizzy. This can increase your chance of falling. Ask your doctor what other things that you can do to help prevent falls. This information is not intended to replace advice given to you by your health care provider. Make sure you discuss any questions you have with your health care provider. Document Released: 09/12/2009 Document Revised: 04/23/2016 Document Reviewed: 12/21/2014 Elsevier Interactive Patient Education  2017 [employer].

## 2020-05-01 LAB — LIPID PANEL
Chol/HDL Ratio: 4.8 ratio — ABNORMAL HIGH (ref 0.0–4.4)
Cholesterol, Total: 186 mg/dL (ref 100–199)
HDL: 39 mg/dL — ABNORMAL LOW (ref >39)
LDL Chol Calc (NIH): 104 mg/dL — ABNORMAL HIGH (ref 0–99)
Triglycerides: 248 mg/dL — ABNORMAL HIGH (ref 0–149)
VLDL Cholesterol Cal: 43 mg/dL — ABNORMAL HIGH (ref 5–40)

## 2020-05-01 LAB — COMPREHENSIVE METABOLIC PANEL
ALT: 10 IU/L (ref 0–32)
AST: 18 IU/L (ref 0–40)
Albumin/Globulin Ratio: 1.7 (ref 1.2–2.2)
Albumin: 4.7 g/dL (ref 3.7–4.7)
Alkaline Phosphatase: 87 IU/L (ref 48–121)
BUN/Creatinine Ratio: 16 (ref 12–28)
BUN: 17 mg/dL (ref 8–27)
Bilirubin Total: 0.5 mg/dL (ref 0.0–1.2)
CO2: 22 mmol/L (ref 20–29)
Calcium: 9.7 mg/dL (ref 8.7–10.3)
Chloride: 104 mmol/L (ref 96–106)
Creatinine, Ser: 1.04 mg/dL — ABNORMAL HIGH (ref 0.57–1.00)
GFR calc Af Amer: 61 mL/min/{1.73_m2} (ref >59)
GFR calc non Af Amer: 53 mL/min/{1.73_m2} — ABNORMAL LOW (ref >59)
Globulin, Total: 2.7 g/dL (ref 1.5–4.5)
Glucose: 93 mg/dL (ref 65–99)
Potassium: 4.4 mmol/L (ref 3.5–5.2)
Sodium: 143 mmol/L (ref 134–144)
Total Protein: 7.4 g/dL (ref 6.0–8.5)

## 2020-05-01 LAB — CBC
Hematocrit: 46.4 % (ref 34.0–46.6)
Hemoglobin: 15.5 g/dL (ref 11.1–15.9)
MCH: 30.3 pg (ref 26.6–33.0)
MCHC: 33.4 g/dL (ref 31.5–35.7)
MCV: 91 fL (ref 79–97)
Platelets: 233 10*3/uL (ref 150–450)
RBC: 5.11 x10E6/uL (ref 3.77–5.28)
RDW: 12.5 % (ref 11.7–15.4)
WBC: 13.2 10*3/uL — ABNORMAL HIGH (ref 3.4–10.8)

## 2020-05-01 LAB — HEMOGLOBIN A1C
Est. average glucose Bld gHb Est-mCnc: 120 mg/dL
Hgb A1c MFr Bld: 5.8 % — ABNORMAL HIGH (ref 4.8–5.6)

## 2020-05-20 ENCOUNTER — Ambulatory Visit (INDEPENDENT_AMBULATORY_CARE_PROVIDER_SITE_OTHER): Payer: Medicare Other | Admitting: Family Medicine

## 2020-05-20 ENCOUNTER — Other Ambulatory Visit: Payer: Self-pay

## 2020-05-20 ENCOUNTER — Encounter: Payer: Self-pay | Admitting: Family Medicine

## 2020-05-20 VITALS — BP 146/77 | HR 83 | Temp 97.1°F | Wt 201.8 lb

## 2020-05-20 DIAGNOSIS — I10 Essential (primary) hypertension: Secondary | ICD-10-CM

## 2020-05-20 DIAGNOSIS — K120 Recurrent oral aphthae: Secondary | ICD-10-CM

## 2020-05-20 MED ORDER — AMLODIPINE BESYLATE 10 MG PO TABS: 10.0000 mg | ORAL_TABLET | Freq: Every day | ORAL | 4 refills | Status: DC

## 2020-05-20 MED ORDER — LOVASTATIN 40 MG PO TABS: 40.0000 mg | ORAL_TABLET | Freq: Every day | ORAL | 4 refills | Status: DC

## 2020-05-20 NOTE — Progress Notes (Signed)
     Established patient visit   Patient: Lindsay Russo   DOB: [birth year]   74 y.o. Female  MRN: [id number] Visit Date: 05/20/2020  Today's healthcare provider: Lelon Huh, MD   Chief Complaint  Patient presents with  . Sore Throat   Mertie Moores as a scribe for Lelon Huh, MD.,have documented all relevant documentation on the behalf of Lelon Huh, MD,as directed by  Lelon Huh, MD while in the presence of Lelon Huh, MD.  Subjective    Sore Throat  This is a new problem. Episode onset: 1 week ago. The problem has been unchanged. The pain is worse on the left side. There has been no fever. Associated symptoms include congestion. Associated symptoms comments: Post nasal drainage. She has tried nothing for the symptoms.  Taking Benadryl for allergies, and throat spray. No trouble swallowing.    Medications: Outpatient Medications Prior to Visit  Medication Sig  . acetaminophen (TYLENOL) 500 MG tablet Take 1,000 mg by mouth every 6 (six) hours as needed for moderate pain or headache.  [name] amLODipine (NORVASC) 10 MG tablet TAKE 1 TABLET BY MOUTH ONCE DAILY.  [name] diclofenac Sodium (VOLTAREN) 1 % GEL Apply topically 4 (four) times daily.  [name] lovastatin (MEVACOR) 40 MG tablet TAKE 1 TABLET BY MOUTH AT BEDTIME.  . meclizine (ANTIVERT) 25 MG tablet Take 1 tablet (25 mg total) by mouth 3 (three) times daily as needed for dizziness.  . promethazine (PHENERGAN) 25 MG tablet Take 1 tablet (25 mg total) by mouth every 8 (eight) hours as needed for nausea or vomiting.  [name] spironolactone (ALDACTONE) 25 MG tablet TAKE 1 TABLET BY MOUTH ONCE EVERY MORNING  . traMADol (ULTRAM) 50 MG tablet Take 1 tablet (50 mg total) by mouth every 6 (six) hours as needed.   No facility-administered medications prior to visit.    Review of Systems  Constitutional: Negative.   HENT: Positive for congestion, postnasal drip and sore throat.   Respiratory: Negative.   Cardiovascular: Negative.    Musculoskeletal: Negative.      Objective    BP (!) 146/77 (BP Location: Right Arm, Patient Position: Sitting, Cuff Size: Large)   Pulse 83   Temp (!) 97.1 F (36.2 C) (Temporal)   Wt 201 lb 12.8 oz (91.5 kg)   BMI 32.57 kg/m   Physical Exam   General: Appearance:    Obese female in no acute distress  Eyes:    PERRL, conjunctiva/corneas clear, EOM's intact       Oral:      52mm aphthous ulcer caudal aspect of hard palate just left of midline.     No results found for any visits on 05/20/20.  Assessment & Plan     1. Ulcer aphthous oral Likely benign viral etiology. No risk factors for OP cancer. Counseled on gargling with warm salt water and OTC NSAIDs recheck in 7-10 days to ensure normal healing.   No follow-ups on file.      The entirety of the information documented in the History of Present Illness, Review of Systems and Physical Exam were personally obtained by me. Portions of this information were initially documented by the CMA and reviewed by me for thoroughness and accuracy.      Lelon Huh, MD  [facility] [place] [phone] (phone) [phone] (fax)  [place]

## 2020-05-20 NOTE — Patient Instructions (Addendum)
. Gargle mouth with warm salt water 3-4 times a day and before going t bed   Take 2 Advil or ibuprofen every evening before going to bed.    Oral Ulcers Oral ulcers are small sores inside the mouth or near the mouth. They may occur on or inside the lips, inside the cheeks, on the tongue, or anywhere else inside or near the mouth. They may be called canker sores or cold sores, which are two types of oral ulcers. Many oral ulcers are harmless and go away on their own. In some cases, oral ulcers may require medical care to determine the cause and proper treatment. What are the causes? Common causes of this condition include:  Infections caused by viruses, bacteria, or fungi.  Emotional stress.  Foods or chemicals that irritate the mouth.  Injury or physical irritation of the mouth.  Medicines.  Allergies.  Tobacco use. Less common causes include:  Skin disease.  A type of herpes virus infection (herpes simplexor herpes zoster).  Oral cancer. In some cases, the cause may not be known. What increases the risk? You are more likely to develop this condition if:  You wear dental braces, dentures, or retainers.  You have poor oral hygiene.  You have sensitive skin.  You have a condition that affects the entire body (systemic condition), such as an immune disorder. What are the signs or symptoms? The main symptom of this condition is having one or more oval-shaped or round ulcers that have red borders. Symptoms may vary depending on the cause. This includes:  Location of the ulcers. Ulcers may be found inside the mouth, on the gums, or on the insides of the lips or cheeks. They may also be found on the lips or on skin that is near the mouth, such as the cheeks or chin.  Pain. Ulcers can be painful and uncomfortable, or they can be painless.  Appearance of the ulcers. They may look like red blisters and be filled with fluid, or they may be white or yellow patches.  Frequency  of outbreaks. Ulcers may go away permanently after one outbreak, or they may come back (recur) often or rarely. How is this diagnosed? This condition is diagnosed with a physical exam. Your health care provider may ask you questions about your lifestyle and your medical history. You may have tests, including:  Blood tests.  Removal of a small number of cells from an ulcer to be examined under a microscope (biopsy). How is this treated? Treatment depends on the severity and cause of the condition. Oral ulcers often go away on their own in 1-2 weeks. Treatment may include medicines, such as:  Medicines to treat a viral infection (antivirals), a bacterial infection (antibiotics), or a fungal infection (antifungals).  Medicines to help control pain. This may include: ? Over-the-counter pain medicines. ? Gel, cream, or spray to numb the area (topical anesthetic) if you have severe pain. ? Other medicines to coat or numb your mouth. Follow these instructions at home: Medicines  Take or use over-the-counter and prescription medicines only as told by your health care provider.  If you were prescribed an antibiotic medicine, take it as told by your health care provider. Do not stop taking the antibiotic even if you start to feel better.  Do not use products that contain benzocaine (including numbing gels) to treat teething or mouth pain in children who are younger than 2 years. These products may cause a rare but serious blood condition. Eating  and drinking  Eat a balanced diet. Do not eat: ? Spicy foods. ? Citrus, such as oranges. ? Other foods and drinks that you think may cause or irritate your ulcers.  Drink enough fluid to keep your urine pale yellow.  Avoid drinking alcohol. Lifestyle   Practice good oral hygiene: ? Gently brush your teeth with a soft toothbrush two times a day. ? Floss your teeth every day. ? Get regular dental cleanings and checkups.  Do not use any products  that contain nicotine or tobacco, such as cigarettes and e-cigarettes. If you need help quitting, ask your health care provider. Managing pain  If directed, put ice on your face in the affected area to help reduce pain. ? Put ice in a plastic bag. ? Place a towel between your skin and the bag. ? Leave the ice on for 20 minutes, 2-3 times a day.  Avoid physical or chemical irritants that may have caused the ulcers or made them worse, such as mouthwashes that contain alcohol (ethanol). If you wear dental braces, dentures, or retainers, work with your health care provider to make sure these devices are fitted correctly.  If you were prescribed a prescription mouthwash to help reduce pain in your mouth, use it as told by your health care provider. General instructions  Rinse with a salt-water mixture 3-4 times a day or as told by your health care provider. To make a salt-water mixture, completely dissolve -1 tsp (3-6 g) of salt in 1 cup (237 mL) of warm water.  Keep all follow-up visits as told by your health care provider. This is important. Contact a health care provider if:  You have: ? Pain that gets worse or does not get better with medicine. ? Four or more ulcers at one time. ? A fever. ? New ulcers that look or feel different from other ulcers you have. ? Inflammation in one eye or both eyes. ? Ulcers that do not go away after 10 days.  You develop new symptoms in your mouth, such as: ? Bleeding or crusting around your lips or gums. ? Tooth pain. ? Difficulty swallowing.  You develop symptoms on your skin or genitals, such as: ? A rash or blisters. ? Burning or itching sensations.  Your ulcers begin or get worse after you start a new medicine. Get help right away if you have:  Difficulty breathing.  Swelling in your face or neck.  Excessive bleeding from your mouth.  Severe pain. Summary  Oral ulcers may occur anywhere inside or near the mouth.  They can be caused  by many things, such as infections, stress, injury or irritation, or tobacco use.  Oral ulcers can be painful or painless.  Treatment may include medicines to relieve pain or to treat an infection (if appropriate).  Most oral ulcers go away in 1-2 weeks. This information is not intended to replace advice given to you by your health care provider. Make sure you discuss any questions you have with your health care provider. Document Revised: 03/31/2018 Document Reviewed: 03/31/2018 Elsevier Patient Education  Lindsay Russo.

## 2020-05-23 ENCOUNTER — Ambulatory Visit
Admission: RE | Admit: 2020-05-23 | Discharge: 2020-05-23 | Disposition: A | Discharge status: Home / Self Care | Payer: Medicare Other | Source: Ambulatory Visit | Attending: Family Medicine | Admitting: Family Medicine

## 2020-05-23 DIAGNOSIS — Z780 Asymptomatic menopausal state: Secondary | ICD-10-CM | POA: Diagnosis not present

## 2020-05-23 DIAGNOSIS — Z13820 Encounter for screening for osteoporosis: Secondary | ICD-10-CM | POA: Diagnosis not present

## 2020-05-23 DIAGNOSIS — E2839 Other primary ovarian failure: Secondary | ICD-10-CM | POA: Insufficient documentation

## 2020-05-24 ENCOUNTER — Telehealth: Payer: Medicare Other | Admitting: Family Medicine

## 2020-05-29 NOTE — Progress Notes (Signed)
     Established patient visit   Patient: Lindsay Russo   DOB: 1946-08-18   74 y.o. Female  MRN: [id number] Visit Date: 05/31/2020  Today's healthcare provider: Lelon Huh, MD   Chief Complaint  Patient presents with  . Mouth Lesions   Mertie Moores as a scribe for Lelon Huh, MD.,have documented all relevant documentation on the behalf of Lelon Huh, MD,as directed by  Lelon Huh, MD while in the presence of Lelon Huh, MD.  Subjective    HPI  Follow up for Ulcer aphthous oral:  The patient was last seen for this 10 days ago. Changes made at last visit include patient counseled on gargling with warm salt water and OTC NSAIDs . She reports good compliance with treatment. She feels that condition is much Improved. Patient denies pain. She is not having side effects.   -----------------------------------------------------------------------------------------    Medications: Outpatient Medications Prior to Visit  Medication Sig  . acetaminophen (TYLENOL) 500 MG tablet Take 1,000 mg by mouth every 6 (six) hours as needed for moderate pain or headache.  [name] amLODipine (NORVASC) 10 MG tablet Take 1 tablet (10 mg total) by mouth daily.  . diclofenac Sodium (VOLTAREN) 1 % GEL Apply topically 4 (four) times daily.  [name] lovastatin (MEVACOR) 40 MG tablet Take 1 tablet (40 mg total) by mouth at bedtime.  . meclizine (ANTIVERT) 25 MG tablet Take 1 tablet (25 mg total) by mouth 3 (three) times daily as needed for dizziness.  . promethazine (PHENERGAN) 25 MG tablet Take 1 tablet (25 mg total) by mouth every 8 (eight) hours as needed for nausea or vomiting.  [name] spironolactone (ALDACTONE) 25 MG tablet TAKE 1 TABLET BY MOUTH ONCE EVERY MORNING  . traMADol (ULTRAM) 50 MG tablet Take 1 tablet (50 mg total) by mouth every 6 (six) hours as needed.   No facility-administered medications prior to visit.    Review of Systems   Objective    BP 108/67 (BP Location: Right  Arm, Patient Position: Sitting, Cuff Size: Large)   Pulse 82   Temp (!) 97.3 F (36.3 C) (Temporal)   Ht 5\' 6"  (1.676 m)   Wt 203 lb (92.1 kg)   BMI 32.77 kg/m   Physical Exam   General appearance: Obese female, cooperative and in no acute distress Head: Normocephalic, without obvious abnormality, atraumatic OP: about 6-65mm slightly erythematous patch    No results found for any visits on 05/31/20.  Assessment & Plan     1. Ulcer aphthous oral Nearly healed. She is to continue regular salt water gargles and call if flares back up.    No follow-ups on file.      The entirety of the information documented in the History of Present Illness, Review of Systems and Physical Exam were personally obtained by me. Portions of this information were initially documented by the CMA and reviewed by me for thoroughness and accuracy.      Lelon Huh, MD  [facility] [phone] (phone) [phone] (fax)  [place]

## 2020-05-31 ENCOUNTER — Ambulatory Visit (INDEPENDENT_AMBULATORY_CARE_PROVIDER_SITE_OTHER): Payer: Medicare Other | Admitting: Family Medicine

## 2020-05-31 ENCOUNTER — Other Ambulatory Visit: Payer: Self-pay

## 2020-05-31 ENCOUNTER — Encounter: Payer: Self-pay | Admitting: Family Medicine

## 2020-05-31 VITALS — BP 108/67 | HR 82 | Temp 97.3°F | Ht 66.0 in | Wt 203.0 lb

## 2020-05-31 DIAGNOSIS — K120 Recurrent oral aphthae: Secondary | ICD-10-CM | POA: Diagnosis not present

## 2020-06-24 ENCOUNTER — Ambulatory Visit (INDEPENDENT_AMBULATORY_CARE_PROVIDER_SITE_OTHER): Payer: Medicare Other | Admitting: Physician Assistant

## 2020-06-24 ENCOUNTER — Other Ambulatory Visit: Payer: Self-pay

## 2020-06-24 ENCOUNTER — Encounter: Payer: Self-pay | Admitting: Physician Assistant

## 2020-06-24 VITALS — BP 131/76 | HR 70 | Temp 97.6°F | Resp 16 | Wt 201.6 lb

## 2020-06-24 DIAGNOSIS — L304 Erythema intertrigo: Secondary | ICD-10-CM | POA: Diagnosis not present

## 2020-06-24 MED ORDER — NYSTATIN 100000 UNIT/GM EX POWD: 1.0000 "application " | Freq: Three times a day (TID) | CUTANEOUS | 4 refills | Status: DC

## 2020-06-24 MED ORDER — FLUCONAZOLE 150 MG PO TABS: 150.0000 mg | ORAL_TABLET | Freq: Every day | ORAL | 0 refills | Status: DC

## 2020-06-24 NOTE — Progress Notes (Signed)
Established patient visit   Patient: Lindsay Russo   DOB: [birth year]   74 y.o. Female  MRN: [id number] Visit Date: 06/24/2020  Today's healthcare provider: Mar Daring, PA-C   Chief Complaint  Patient presents with  . Rash   Subjective    Rash This is a new problem. The current episode started 1 to 4 weeks ago. The affected locations include the chest. The rash is characterized by itchiness and redness. It is unknown (she was working out in the yard) if there was an exposure to a precipitant. Pertinent negatives include no fatigue, joint pain, shortness of breath or sore throat. Treatments tried: gold bond powder and spray, corn starch, Neosporin, took Benadryl.    Patient Active Problem List   Diagnosis Date Noted  . Aortic atherosclerosis ([place]) 01/06/2019  . Rash due to allergy 08/08/2018  . Restless leg syndrome 08/08/2018  . Breast calcification, left 08/16/2017  . S/P total knee arthroplasty 10/26/2016  . Allergic rhinitis 11/21/2015  . Personal history of urinary calculi 11/21/2015  . History of migraine 11/21/2015  . CN (constipation) 11/21/2015  . Arthritis 11/21/2015  . Vertigo 05/13/2015  . Senile purpura ([place]) 05/13/2015  . Cerebrovascular disease 03/26/2015  . Degenerative disc disease, lumbar 03/26/2015  . Obesity 03/26/2015  . Hyperlipidemia, mixed 03/26/2015  . Hypertension 03/26/2015  . Pre-diabetes 03/26/2015  . History of left breast cancer 08/21/2014  . Lobular carcinoma in situ 09/26/2013  . Breast neoplasm, Tis (LCIS) 09/12/2013  . Breast microcalcification, mammographic 07/26/2013  . History of CVA (cerebrovascular accident) 09/17/2010  . History of colon cancer 09/30/2006   Past Medical History:  Diagnosis Date  . Breast cancer ([place]) 2014   left breast  . History of kidney stones   . LCIS September 05, 2013   Left breast, ER 90%, PR 60%  . Malignant neoplasm of ascending colon [facility]) November 2007   T2, N0.5.2 cm low-grade  adenocarcinoma. 0/28 nodes positive.  . Personal history of colonic polyps   . Spinal headache 1995   at [facility] spinal made me feel dead, had no control.  . Stroke ([place]) 09/17/2010   Right thalamic without residual effects  . Tubulovillous adenoma polyp of colon 10/06/2017   2 polyps, 5 and 10 mm completely resected from the area of the ileocolic anastomosis.  Tubulovillous polyp with high-grade dysplasia.       Medications: Outpatient Medications Prior to Visit  Medication Sig  . acetaminophen (TYLENOL) 500 MG tablet Take 1,000 mg by mouth every 6 (six) hours as needed for moderate pain or headache.  [name] amLODipine (NORVASC) 10 MG tablet Take 1 tablet (10 mg total) by mouth daily.  . diclofenac Sodium (VOLTAREN) 1 % GEL Apply topically 4 (four) times daily.  [name] lovastatin (MEVACOR) 40 MG tablet Take 1 tablet (40 mg total) by mouth at bedtime.  . meclizine (ANTIVERT) 25 MG tablet Take 1 tablet (25 mg total) by mouth 3 (three) times daily as needed for dizziness.  . promethazine (PHENERGAN) 25 MG tablet Take 1 tablet (25 mg total) by mouth every 8 (eight) hours as needed for nausea or vomiting.  [name] spironolactone (ALDACTONE) 25 MG tablet TAKE 1 TABLET BY MOUTH ONCE EVERY MORNING  . traMADol (ULTRAM) 50 MG tablet Take 1 tablet (50 mg total) by mouth every 6 (six) hours as needed.   No facility-administered medications prior to visit.    Review of Systems  Constitutional: Negative for fatigue.  HENT: Negative for sore throat.  Respiratory: Negative for shortness of breath.   Musculoskeletal: Negative for joint pain.  Skin: Positive for rash.    Last CBC Lab Results  Component Value Date   WBC 13.2 (H) 04/30/2020   HGB 15.5 04/30/2020   HCT 46.4 04/30/2020   MCV 91 04/30/2020   MCH 30.3 04/30/2020   RDW 12.5 04/30/2020   PLT 233 37/85/8850   Last metabolic panel Lab Results  Component Value Date   GLUCOSE 93 04/30/2020   NA 143 04/30/2020   K 4.4 04/30/2020   CL 104  04/30/2020   CO2 22 04/30/2020   BUN 17 04/30/2020   CREATININE 1.04 (H) 04/30/2020   GFRNONAA 53 (L) 04/30/2020   GFRAA 61 04/30/2020   CALCIUM 9.7 04/30/2020   PHOS 4.4 (H) 11/14/2019   PROT 7.4 04/30/2020   ALBUMIN 4.7 04/30/2020   LABGLOB 2.7 04/30/2020   AGRATIO 1.7 04/30/2020   BILITOT 0.5 04/30/2020   ALKPHOS 87 04/30/2020   AST 18 04/30/2020   ALT 10 04/30/2020   ANIONGAP 10 10/28/2016      Objective    BP (!) 131/76 (BP Location: Left Arm, Patient Position: Sitting, Cuff Size: Large)   Pulse 70   Temp 97.6 F (36.4 C) (Temporal)   Resp 16   Wt 201 lb 9.6 oz (91.4 kg)   BMI 32.54 kg/m  BP Readings from Last 3 Encounters:  06/24/20 (!) 131/76  05/31/20 108/67  05/20/20 (!) 146/77   Wt Readings from Last 3 Encounters:  06/24/20 201 lb 9.6 oz (91.4 kg)  05/31/20 203 lb (92.1 kg)  05/20/20 201 lb 12.8 oz (91.5 kg)      Physical Exam Vitals reviewed.  Constitutional:      General: She is not in acute distress.    Appearance: Normal appearance. She is well-developed. She is obese. She is not ill-appearing.  HENT:     Head: Normocephalic and atraumatic.  Pulmonary:     Effort: Pulmonary effort is normal. No respiratory distress.  Musculoskeletal:     Cervical back: Normal range of motion and neck supple.  Skin:    Findings: Erythema and rash present.       Neurological:     Mental Status: She is alert.  Psychiatric:        Behavior: Behavior normal.        Thought Content: Thought content normal.        Judgment: Judgment normal.       No results found for any visits on 06/24/20.  Assessment & Plan     1. Intertrigo Will treat with diflucan as below. Hold Lovastatin. Once clearing with diflucan may start using topcial Nystatin powder to prevent recurrence. Also discussed interdry pads that can be used to pull moisture away and prevent infection as well. Keep area as clean and dry as possible. Call fi worsening. - fluconazole (DIFLUCAN) 150 MG  tablet; Take 1 tablet (150 mg total) by mouth daily. Hold lovastatin while on fluconazole  Dispense: 3 tablet; Refill: 0 - nystatin (MYCOSTATIN/NYSTOP) powder; Apply 1 application topically 3 (three) times daily.  Dispense: 60 g; Refill: 4   No follow-ups on file.      Reynolds Bowl, PA-C, have reviewed all documentation for this visit. The documentation on 06/25/20 for the exam, diagnosis, procedures, and orders are all accurate and complete.   Rubye Beach  [facility] [phone] (phone) [phone] (fax)  [place]

## 2020-06-24 NOTE — Patient Instructions (Addendum)
Interdry pads can be bought to help and put in ski folds  Hold lovastatin while taking Diflucan   Intertrigo Intertrigo is skin irritation or inflammation (dermatitis) that occurs when folds of skin rub together. The irritation can cause a rash and make skin raw and itchy. This condition most commonly occurs in the skin folds of these areas:  Toes.  Armpits.  Groin.  Under the belly.  Under the breasts.  Buttocks. Intertrigo is not passed from person to person (is not contagious). What are the causes? This condition is caused by heat, moisture, rubbing (friction), and not enough air circulation. The condition can be made worse by:  Sweat.  Bacteria.  A fungus, such as yeast. What increases the risk? This condition is more likely to occur if you have moisture in your skin folds. You are more likely to develop this condition if you:  Have diabetes.  Are overweight.  Are not able to move around or are not active.  Live in a warm and moist climate.  Wear splints, braces, or other medical devices.  Are not able to control your bowels or bladder (have incontinence). What are the signs or symptoms? Symptoms of this condition include:  A pink or red skin rash in the skin fold or near the skin fold.  Raw or scaly skin.  Itchiness.  A burning feeling.  Bleeding.  Leaking fluid.  A bad smell. How is this diagnosed? This condition is diagnosed with a medical history and physical exam. You may also have a skin swab to test for bacteria or a fungus. How is this treated? This condition may be treated by:  Cleaning and drying your skin.  Taking an antibiotic medicine or using an antibiotic skin cream for a bacterial infection.  Using an antifungal cream on your skin or taking pills for an infection that was caused by a fungus, such as yeast.  Using a steroid ointment to relieve itchiness and irritation.  Separating the skin fold with a clean cotton cloth to  absorb moisture and allow air to flow into the area. Follow these instructions at home:  Keep the affected area clean and dry.  Do not scratch your skin.  Stay in a cool environment as much as possible. Use an air conditioner or fan, if available.  Apply over-the-counter and prescription medicines only as told by your health care provider.  If you were prescribed an antibiotic medicine, use it as told by your health care provider. Do not stop using the antibiotic even if your condition improves.  Keep all follow-up visits as told by your health care provider. This is important. How is this prevented?   Maintain a healthy weight.  Take care of your feet, especially if you have diabetes. Foot care includes: ? Wearing shoes that fit well. ? Keeping your feet dry. ? Wearing clean, breathable socks.  Protect the skin around your groin and buttocks, especially if you have incontinence. Skin protection includes: ? Following a regular cleaning routine. ? Using skin protectant creams, powders, or ointments. ? Changing protection pads frequently.  Do not wear tight clothes. Wear clothes that are loose, absorbent, and made of cotton.  Wear a bra that gives good support, if needed.  Shower and dry yourself well after activity or exercise. Use a hair dryer on a cool setting to dry between skin folds, especially after you bathe.  If you have diabetes, keep your blood sugar under control. Contact a health care provider if:  Your  symptoms do not improve with treatment.  Your symptoms get worse or they spread.  You notice increased redness and warmth.  You have a fever. Summary  Intertrigo is skin irritation or inflammation (dermatitis) that occurs when folds of skin rub together.  This condition is caused by heat, moisture, rubbing (friction), and not enough air circulation.  This condition may be treated by cleaning and drying your skin and with medicines.  Apply  over-the-counter and prescription medicines only as told by your health care provider.  Keep all follow-up visits as told by your health care provider. This is important. This information is not intended to replace advice given to you by your health care provider. Make sure you discuss any questions you have with your health care provider. Document Revised: 04/18/2018 Document Reviewed: 04/18/2018 Elsevier Patient Education  [place].

## 2020-06-26 DIAGNOSIS — H35032 Hypertensive retinopathy, left eye: Secondary | ICD-10-CM | POA: Diagnosis not present

## 2020-06-26 DIAGNOSIS — H26493 Other secondary cataract, bilateral: Secondary | ICD-10-CM | POA: Diagnosis not present

## 2020-06-26 DIAGNOSIS — Z961 Presence of intraocular lens: Secondary | ICD-10-CM | POA: Diagnosis not present

## 2020-07-17 ENCOUNTER — Telehealth: Payer: Self-pay | Admitting: Family Medicine

## 2020-07-17 DIAGNOSIS — Z111 Encounter for screening for respiratory tuberculosis: Secondary | ICD-10-CM

## 2020-07-17 NOTE — Telephone Encounter (Signed)
Dr. Caryn Section patient but since he is not here, Im sending you this and the form.  Patient dropped Form off for completion for part time job for [employer].  Per the patient, she only needs a quantiferon gold test done.  Not the immunizations.  Let patient know when the order for lab is read.  [phone]

## 2020-07-18 NOTE — Telephone Encounter (Signed)
Received form

## 2020-07-23 NOTE — Telephone Encounter (Signed)
Form reviewed by Dr. Caryn Section.   Per Dr. Caryn Section, "OK to order TB Quantiferon test. She is also due for Td vaccine."   I placed an order for the TB test. I called and advised patient of Dr. Maralyn Sago message. Patient plans to come by the lab tomorrow to get TB blood test. She states her job doesn't require that she get the tetanus vaccine updated at this time so she will hold off on getting that vaccine.

## 2020-07-23 NOTE — Telephone Encounter (Signed)
Form given to patient's pcp.

## 2020-07-27 LAB — QUANTIFERON-TB GOLD PLUS
QuantiFERON Mitogen Value: >10 IU/mL
QuantiFERON Nil Value: 0.02 IU/mL
QuantiFERON TB1 Ag Value: 0 IU/mL
QuantiFERON TB2 Ag Value: 0.01 IU/mL
QuantiFERON-TB Gold Plus: NEGATIVE

## 2020-07-31 NOTE — Telephone Encounter (Signed)
TB lab test was done on 07/24/2020. Form has been placed on Dr. Maralyn Sago desk for review and signature.

## 2020-07-31 NOTE — Telephone Encounter (Signed)
Has been completed and signed

## 2020-08-06 ENCOUNTER — Other Ambulatory Visit: Payer: Self-pay | Admitting: General Surgery

## 2020-08-06 DIAGNOSIS — Z1211 Encounter for screening for malignant neoplasm of colon: Secondary | ICD-10-CM

## 2020-08-14 ENCOUNTER — Ambulatory Visit (INDEPENDENT_AMBULATORY_CARE_PROVIDER_SITE_OTHER): Payer: Medicare Other

## 2020-08-14 ENCOUNTER — Other Ambulatory Visit: Payer: Self-pay

## 2020-08-14 DIAGNOSIS — Z23 Encounter for immunization: Secondary | ICD-10-CM

## 2020-08-15 ENCOUNTER — Other Ambulatory Visit: Payer: Self-pay | Admitting: Family Medicine

## 2020-08-15 DIAGNOSIS — I10 Essential (primary) hypertension: Secondary | ICD-10-CM

## 2020-09-12 ENCOUNTER — Other Ambulatory Visit: Payer: Self-pay

## 2020-09-12 ENCOUNTER — Ambulatory Visit
Admission: RE | Admit: 2020-09-12 | Discharge: 2020-09-12 | Disposition: A | Discharge status: Home / Self Care | Payer: Medicare Other | Source: Ambulatory Visit | Attending: General Surgery | Admitting: General Surgery

## 2020-09-12 DIAGNOSIS — Z1211 Encounter for screening for malignant neoplasm of colon: Secondary | ICD-10-CM

## 2020-09-12 DIAGNOSIS — Z1231 Encounter for screening mammogram for malignant neoplasm of breast: Secondary | ICD-10-CM | POA: Diagnosis not present

## 2020-09-13 ENCOUNTER — Ambulatory Visit (INDEPENDENT_AMBULATORY_CARE_PROVIDER_SITE_OTHER): Payer: Medicare Other

## 2020-09-13 DIAGNOSIS — Z23 Encounter for immunization: Secondary | ICD-10-CM

## 2020-09-19 ENCOUNTER — Other Ambulatory Visit: Payer: Self-pay | Admitting: General Surgery

## 2020-09-19 DIAGNOSIS — D126 Benign neoplasm of colon, unspecified: Secondary | ICD-10-CM | POA: Diagnosis not present

## 2020-09-19 DIAGNOSIS — Z85038 Personal history of other malignant neoplasm of large intestine: Secondary | ICD-10-CM | POA: Diagnosis not present

## 2020-09-19 NOTE — Progress Notes (Signed)
Subjective:     Patient ID: Lindsay Russo is a 73 y.o. female.  HPI  The following portions of the patient's history were reviewed and updated as appropriate.  This an established patient is here today for: office visit. Here for her follow up mammogram 09-12-20.  She is also going to discuss having her colonoscopy which is due in November. Bowels move daily, no bleeding. She states she did have a fall today, at her part time job carrying a pan of liquid, no injuries noted, went to acute care.   Review of Systems  Constitutional: Negative for chills and fever.  Respiratory: Negative for cough.         Chief Complaint  Patient presents with   Follow-up    mammogram and discuss colonoscopy     BP 158/78    Pulse 84    Temp 36.1 C (97 F)    Ht 167.6 cm (5\' 6" )    Wt 88.5 kg (195 lb)    SpO2 96%    BMI 31.47 kg/m       Past Medical History:  Diagnosis Date   Anemia    Breast cancer (CMS-HCC) 09/05/2013   LCIS   Chicken pox    Colon cancer (CMS-HCC) 09/2006   History of kidney stones    Hypertension    Spinal headache 1995   Stroke (CMS-HCC) 09/17/2010   Tubulovillous adenoma polyp of colon, unspecified 10/06/2017          Past Surgical History:  Procedure Laterality Date   APPENDECTOMY  1995   BACK SURGERY  2009   L4-L5 Dr Mauri Pole   BREAST CANCER SX  2015   Dr Bary Castilla   COLON CANCER SX  2007   Dr Bary Castilla   COLONOSCOPY  08-03-12, 10/06/2017   doppler echocardiography  09/17/2010   heel spur excision  2005   INCISIONAL BIOPSY BREAST Left 08/14/2013   INCISIONAL BIOPSY BREAST Right 08/07/2010   Left total knee arthroplasty using computer-assisted navigation  10/26/2016   Dr Marry Guan   lumbar disc surgery  2009   REMOVAL OVARIAN CYST  1980   right partial colectomy  2007   Right total knee arthroplasty   2012   Dr Mauri Pole              OB History    Gravida  2   Para  2   Term      Preterm       AB      Living        SAB      IAB      Ectopic      Molar      Multiple      Live Births          Obstetric Comments  Age at first period 57 Age of first pregnancy 67         Social History          Socioeconomic History   Marital status: Married    Spouse name: Fritz Pickerel   Number of children: 2   Years of education: 14   Highest education level: Not on file  Occupational History   Occupation: Retired  Tobacco Use   Smoking status: Never Smoker   Smokeless tobacco: Never Used  Substance and Sexual Activity   Alcohol use: No   Drug use: No   Sexual activity: Defer    Partners: Male  Other Topics Concern   Not on file  Social History Narrative   Not on file   Social Determinants of Health   Financial Resource Strain: Not on file  Food Insecurity: Not on file  Transportation Needs: Not on file            Allergies  Allergen Reactions   Chlorthalidone Rash   Amoxicillin Rash   Sulfa (Sulfonamide Antibiotics) Rash   Tegaderm Ag Mesh [Silver] Other (See Comments)    blisters    Current Medications        Current Outpatient Medications  Medication Sig Dispense Refill   acetaminophen (TYLENOL) 500 MG tablet Take 1,000 mg by mouth every 8 (eight) hours as needed        amLODIPine (NORVASC) 10 MG tablet Take 10 mg by mouth once daily.     lovastatin (MEVACOR) 40 MG tablet Take 40 mg by mouth daily with dinner.     meclizine (ANTIVERT) 25 mg tablet Take 25 mg by mouth 3 (three) times daily as needed for Dizziness        promethazine (PHENERGAN) 25 MG tablet Take 25 mg by mouth every 8 (eight) hours as needed for Nausea        tamoxifen (NOLVADEX) 20 MG tablet Take 20 mg by mouth once daily        No current facility-administered medications for this visit.           Family History  Problem Relation Age of Onset   Heart disease Mother    Colon cancer Father    Cancer Father         gastric   Brain cancer Father    High blood pressure (Hypertension) Sister    Heart disease Other    Ulcers Other    Epilepsy Other      Labs and Radiology:   October 06, 2017 colonoscopy:  DIAGNOSIS:  A. COLON POLYP 2, ILEOCOLONIC ANASTOMOSIS; HOT SNARE:  - TUBULOVILLOUS ADENOMA WITH FOCAL HIGH GRADE DYSPLASIA.  - NEGATIVE FOR MALIGNANCY.   September 05, 2013 left breast excision:  LCIS/DCIS, pleomorphic.  3 mm negative margin.  September 12, 2020 bilateral screening mammograms were independently reviewed.  Postsurgical changes.     Objective:   Physical Exam Exam conducted with a chaperone present.  Constitutional:      Appearance: Normal appearance.  Cardiovascular:     Rate and Rhythm: Normal rate and regular rhythm.     Pulses: Normal pulses.     Heart sounds: Normal heart sounds.  Pulmonary:     Effort: Pulmonary effort is normal.     Breath sounds: Normal breath sounds.  Chest:  Breasts:     Right: Normal. No axillary adenopathy.     Left: Normal. No axillary adenopathy.        Comments: Left lumpectomy well-healed.  Left breast 1-2 cup sizes smaller than the right. Abdominal:    Musculoskeletal:     Cervical back: Neck supple.  Lymphadenopathy:     Upper Body:     Right upper body: No axillary adenopathy.     Left upper body: No axillary adenopathy.  Skin:    General: Skin is warm and dry.  Neurological:     Mental Status: She is alert and oriented to person, place, and time.  Psychiatric:        Mood and Affect: Mood normal.        Behavior: Behavior normal.        Assessment:     No evidence of recurrent breast cancer.  Candidate for follow-up colonoscopy based on identification of a tubulovillous adenoma with focal high-grade dysplasia (completely resected)    Plan:     Bilateral screening mammograms in 1 year.  Colonoscopy tentatively scheduled for November 20, 2020.    Entered by Karie Fetch, RN,  acting as a scribe for Dr. Hervey Ard, MD.  The documentation recorded by the scribe accurately reflects the service I personally performed and the decisions made by me.   Robert Bellow, MD FACS

## 2020-11-18 ENCOUNTER — Other Ambulatory Visit: Payer: Self-pay

## 2020-11-18 ENCOUNTER — Other Ambulatory Visit
Admission: RE | Admit: 2020-11-18 | Discharge: 2020-11-18 | Disposition: A | Discharge status: Home / Self Care | Payer: Medicare Other | Source: Ambulatory Visit | Attending: General Surgery | Admitting: General Surgery

## 2020-11-18 DIAGNOSIS — Z20822 Contact with and (suspected) exposure to covid-19: Secondary | ICD-10-CM | POA: Diagnosis not present

## 2020-11-18 DIAGNOSIS — Z01812 Encounter for preprocedural laboratory examination: Secondary | ICD-10-CM | POA: Insufficient documentation

## 2020-11-18 LAB — SARS CORONAVIRUS 2 (TAT 6-24 HRS): SARS Coronavirus 2: NEGATIVE

## 2020-11-19 ENCOUNTER — Encounter: Payer: Self-pay | Admitting: General Surgery

## 2020-11-20 ENCOUNTER — Ambulatory Visit
Admission: RE | Admit: 2020-11-20 | Discharge: 2020-11-20 | Disposition: A | Discharge status: Home / Self Care | Payer: Medicare Other | Attending: General Surgery | Admitting: General Surgery

## 2020-11-20 ENCOUNTER — Ambulatory Visit: Payer: Medicare Other | Admitting: Certified Registered Nurse Anesthetist

## 2020-11-20 ENCOUNTER — Encounter
Admission: RE | Disposition: A | Discharge status: Home / Self Care | Payer: Self-pay | Source: Home / Self Care | Attending: General Surgery

## 2020-11-20 ENCOUNTER — Encounter: Payer: Self-pay | Admitting: General Surgery

## 2020-11-20 DIAGNOSIS — I10 Essential (primary) hypertension: Secondary | ICD-10-CM | POA: Diagnosis not present

## 2020-11-20 DIAGNOSIS — Z880 Allergy status to penicillin: Secondary | ICD-10-CM | POA: Insufficient documentation

## 2020-11-20 DIAGNOSIS — Z1211 Encounter for screening for malignant neoplasm of colon: Secondary | ICD-10-CM | POA: Diagnosis not present

## 2020-11-20 DIAGNOSIS — D127 Benign neoplasm of rectosigmoid junction: Secondary | ICD-10-CM | POA: Insufficient documentation

## 2020-11-20 DIAGNOSIS — K5790 Diverticulosis of intestine, part unspecified, without perforation or abscess without bleeding: Secondary | ICD-10-CM | POA: Diagnosis not present

## 2020-11-20 DIAGNOSIS — Z888 Allergy status to other drugs, medicaments and biological substances status: Secondary | ICD-10-CM | POA: Diagnosis not present

## 2020-11-20 DIAGNOSIS — K5730 Diverticulosis of large intestine without perforation or abscess without bleeding: Secondary | ICD-10-CM | POA: Diagnosis not present

## 2020-11-20 DIAGNOSIS — Z85038 Personal history of other malignant neoplasm of large intestine: Secondary | ICD-10-CM | POA: Insufficient documentation

## 2020-11-20 DIAGNOSIS — I739 Peripheral vascular disease, unspecified: Secondary | ICD-10-CM | POA: Diagnosis not present

## 2020-11-20 DIAGNOSIS — Z86010 Personal history of colonic polyps: Secondary | ICD-10-CM | POA: Insufficient documentation

## 2020-11-20 DIAGNOSIS — Z882 Allergy status to sulfonamides status: Secondary | ICD-10-CM | POA: Diagnosis not present

## 2020-11-20 DIAGNOSIS — Z9049 Acquired absence of other specified parts of digestive tract: Secondary | ICD-10-CM | POA: Insufficient documentation

## 2020-11-20 DIAGNOSIS — Z980 Intestinal bypass and anastomosis status: Secondary | ICD-10-CM | POA: Diagnosis not present

## 2020-11-20 DIAGNOSIS — Z79899 Other long term (current) drug therapy: Secondary | ICD-10-CM | POA: Diagnosis not present

## 2020-11-20 HISTORY — PX: COLONOSCOPY WITH PROPOFOL: SHX5780

## 2020-11-20 HISTORY — DX: Anemia, unspecified: D64.9

## 2020-11-20 SURGERY — COLONOSCOPY WITH PROPOFOL
Anesthesia: General

## 2020-11-20 MED ORDER — PROPOFOL 500 MG/50ML IV EMUL
INTRAVENOUS | Status: AC
  Filled 2020-11-20: qty 50

## 2020-11-20 MED ORDER — SODIUM CHLORIDE 0.9 % IV SOLN: INTRAVENOUS | Status: DC

## 2020-11-20 MED ORDER — GLYCOPYRROLATE 0.2 MG/ML IJ SOLN
INTRAMUSCULAR | Status: AC
  Filled 2020-11-20: qty 1

## 2020-11-20 MED ORDER — PROPOFOL 500 MG/50ML IV EMUL
INTRAVENOUS | Status: DC | PRN
  Administered 2020-11-20: 150 ug/kg/min via INTRAVENOUS

## 2020-11-20 MED ORDER — LIDOCAINE HCL (PF) 2 % IJ SOLN
INTRAMUSCULAR | Status: AC
  Filled 2020-11-20: qty 5

## 2020-11-20 MED ORDER — LIDOCAINE HCL (CARDIAC) PF 100 MG/5ML IV SOSY
PREFILLED_SYRINGE | INTRAVENOUS | Status: DC | PRN
  Administered 2020-11-20: 50 mg via INTRAVENOUS

## 2020-11-20 MED ORDER — PROPOFOL 10 MG/ML IV BOLUS
INTRAVENOUS | Status: DC | PRN
  Administered 2020-11-20: 60 mg via INTRAVENOUS

## 2020-11-20 NOTE — Op Note (Signed)
[facility] Gastroenterology Patient Name: Lindsay Russo Procedure Date: 11/20/2020 7:29 AM MRN: [id number] Account #: [id number] Date of Birth: 07/13/1946 Admit Type: Outpatient Age: 74 Room: [facility] ENDO ROOM 1 Gender: Female Note Status: Finalized Procedure:             Colonoscopy Indications:           High risk colon cancer surveillance: Personal history                         of colonic polyps, High risk colon cancer                         surveillance: Personal history of colon cancer Providers:             Robert Bellow, MD Referring MD:          Kirstie Peri. Caryn Section, MD (Referring MD) Medicines:             Monitored Anesthesia Care Complications:         No immediate complications. Procedure:             Pre-Anesthesia Assessment:                        - Prior to the procedure, a History and Physical was                         performed, and patient medications, allergies and                         sensitivities were reviewed. The patient's tolerance                         of previous anesthesia was reviewed.                        - The risks and benefits of the procedure and the                         sedation options and risks were discussed with the                         patient. All questions were answered and informed                         consent was obtained.                        After obtaining informed consent, the colonoscope was                         passed under direct vision. Throughout the procedure,                         the patient's blood pressure, pulse, and oxygen                         saturations were monitored continuously. The was                         introduced  through the anus and advanced to the the                         ileocolonic anastomosis. The colonoscopy was performed                         without difficulty. The patient tolerated the                         procedure well. The quality of the  bowel preparation                         was excellent. Findings:      A few small-mouthed diverticula were found in the recto-sigmoid colon       and sigmoid colon.      A 5 mm polyp was found in the recto-sigmoid colon. The polyp was       sessile. Biopsies were taken with a cold forceps for histology.      The retroflexed view of the distal rectum and anal verge was normal and       showed no anal or rectal abnormalities. Impression:            - Diverticulosis in the recto-sigmoid colon and in the                         sigmoid colon.                        - One 5 mm polyp at the recto-sigmoid colon. Biopsied.                        - The distal rectum and anal verge are normal on                         retroflexion view. Recommendation:        - Telephone endoscopist for pathology results in 1                         week. Procedure Code(s):     --- Professional ---                        [id number], Colonoscopy, flexible; with biopsy, single or                         multiple Diagnosis Code(s):     --- Professional ---                        Z86.010, Personal history of colonic polyps                        Z85.038, Personal history of other malignant neoplasm                         of large intestine                        K63.5, Polyp of colon  K57.30, Diverticulosis of large intestine without                         perforation or abscess without bleeding CPT copyright 2019 American Medical Association. All rights reserved. The codes documented in this report are preliminary and upon coder review may  be revised to meet current compliance requirements. Robert Bellow, MD 11/20/2020 8:11:52 AM This report has been signed electronically. Number of Addenda: 0 Note Initiated On: 11/20/2020 7:29 AM Scope Withdrawal Time: 0 hours 15 minutes 52 seconds  Total Procedure Duration: 0 hours 18 minutes 23 seconds       [facility]

## 2020-11-20 NOTE — Anesthesia Preprocedure Evaluation (Signed)
Anesthesia Evaluation  Patient identified by MRN, date of birth, ID band Patient awake    Reviewed: Allergy & Precautions, H&P , NPO status , Patient's Chart, lab work & pertinent test results  History of Anesthesia Complications (+) POST - OP SPINAL HEADACHE and history of anesthetic complications  Airway Mallampati: III  TM Distance: <3 FB Neck ROM: limited    Dental  (+) Chipped, Poor Dentition   Pulmonary neg shortness of breath,    Pulmonary exam normal        Cardiovascular Exercise Tolerance: Good hypertension, (-) angina+ Peripheral Vascular Disease  (-) Past MI Normal cardiovascular exam     Neuro/Psych  Headaches, CVA negative psych ROS   GI/Hepatic negative GI ROS, Neg liver ROS, neg GERD  ,  Endo/Other  negative endocrine ROS  Renal/GU negative Renal ROS  negative genitourinary   Musculoskeletal  (+) Arthritis ,   Abdominal Normal abdominal exam  (+)   Peds  Hematology negative hematology ROS (+)   Anesthesia Other Findings Signs and symptoms suggestive of sleep apnea    Past Medical History: No date: Arthritis No date: History of kidney stones No date: Hypertension September 05, 2013: LCIS     Comment:  Left breast, ER 90%, PR 60% November 2007: Malignant neoplasm of ascending colon (HCC)     Comment:  T2, N0.5.2 cm low-grade adenocarcinoma. 0/28 nodes               positive. No date: Personal history of colonic polyps 1995: Spinal headache     Comment:  at [facility] spinal made me feel dead, had no control. September 25, 2010: Stroke [facility])     Comment:  Right thalamic without residual effects  Past Surgical History: 1995: APPENDECTOMY August 07, 2010: BREAST BIOPSY; Right     Comment:  Sclerotic fibroadenoma removed from the right breast at               the 10:00 position 08/14/2013: BREAST BIOPSY; Left     Comment:  left breast superior LCIS. per path report: Pleomorphic               LCIS has  similar behavior to DCIS. 08/14/2013: BREAST BIOPSY; Left     Comment:  left breast superior fibroadenoma 08/23/2017: BREAST BIOPSY; Left     Comment:  stereo bx with Dr. Bary Castilla of lower inner quad. Cylinder              clip. path pending 09/05/2013: BREAST LUMPECTOMY; Left     Comment:  clear margins 09/05/13: BREAST SURGERY; Left     Comment:  LCIS, ER 90%, PR 60%, negative margins. August 03, 2012: COLONOSCOPY     Comment:  8 mm tubulovillous adenoma removed from the transverse               colon. Followup exam in 2018 plan. 25-Sep-2010: DOPPLER ECHOCARDIOGRAPHY     Comment:  EF 55%, Mild to moderate MR and mild to moderate TR 2005: HEEL SPUR EXCISION; Left 2012: JOINT REPLACEMENT; Right 2009: LUMBAR DISC SURGERY     Comment:  L4-5 microdiscectomy Dr. Mauri Pole 1980: OVARIAN CYST REMOVAL 2007: PARTIAL COLECTOMY; Right     Comment:  Dr. Bary Castilla 12/2010: REPLACEMENT TOTAL KNEE; Right  BMI    Body Mass Index:  35.83 kg/m      Reproductive/Obstetrics negative OB ROS  Anesthesia Physical  Anesthesia Plan  ASA: III  Anesthesia Plan: General   Post-op Pain Management:    Induction: Intravenous  PONV Risk Score and Plan: 3 and Propofol infusion  Airway Management Planned: Natural Airway and Nasal Cannula  Additional Equipment:   Intra-op Plan:   Post-operative Plan:   Informed Consent: I have reviewed the patients History and Physical, chart, labs and discussed the procedure including the risks, benefits and alternatives for the proposed anesthesia with the patient or authorized representative who has indicated his/her understanding and acceptance.     Dental Advisory Given  Plan Discussed with: Anesthesiologist, CRNA and Surgeon  Anesthesia Plan Comments: (Patient consented for risks of anesthesia including but not limited to:  - adverse reactions to medications - risk of intubation if required - damage to  teeth, lips or other oral mucosa - sore throat or hoarseness - Damage to heart, brain, lungs or loss of life  Patient voiced understanding.)        Anesthesia Quick Evaluation

## 2020-11-20 NOTE — Anesthesia Postprocedure Evaluation (Signed)
Anesthesia Post Note  Patient: Lindsay Russo  Procedure(s) Performed: COLONOSCOPY WITH PROPOFOL (N/A )  Patient location during evaluation: Endoscopy Anesthesia Type: General Level of consciousness: awake and alert and oriented Pain management: pain level controlled Vital Signs Assessment: post-procedure vital signs reviewed and stable Cardiovascular status: blood pressure returned to baseline Anesthetic complications: no   No complications documented.   Last Vitals:  Vitals:   11/20/20 0813 11/20/20 0833  BP: (!) 95/55 (!) 110/96  Pulse: 68   Resp: 15   Temp: (!) 36.2 C   SpO2: 97%     Last Pain:  Vitals:   11/20/20 0843  TempSrc:   PainSc: 0-No pain                 Angeldejesus Callaham

## 2020-11-20 NOTE — H&P (Signed)
Lindsay Russo [id number] 1946/05/08     HPI: Prior right hemicolectomy in 2007. Colonoscopy in 2018 showed TVA with focal high grade dysplasia. For f/u exam. Tolerated prep well.   Medications Prior to Admission  Medication Sig Dispense Refill Last Dose  . amLODipine (NORVASC) 10 MG tablet Take 1 tablet (10 mg total) by mouth daily. 90 tablet 4 11/19/2020 at Unknown time  . lovastatin (MEVACOR) 40 MG tablet Take 1 tablet (40 mg total) by mouth at bedtime. 90 tablet 4 11/19/2020 at Unknown time  . spironolactone (ALDACTONE) 25 MG tablet TAKE 1 TABLET BY MOUTH ONCE EVERY MORNING 90 tablet 0 11/19/2020 at Unknown time  . acetaminophen (TYLENOL) 500 MG tablet Take 1,000 mg by mouth every 6 (six) hours as needed for moderate pain or headache.     . diclofenac Sodium (VOLTAREN) 1 % GEL Apply topically 4 (four) times daily.     . fluconazole (DIFLUCAN) 150 MG tablet Take 1 tablet (150 mg total) by mouth daily. Hold lovastatin while on fluconazole 3 tablet 0   . meclizine (ANTIVERT) 25 MG tablet Take 1 tablet (25 mg total) by mouth 3 (three) times daily as needed for dizziness. 30 tablet 0   . nystatin (MYCOSTATIN/NYSTOP) powder Apply 1 application topically 3 (three) times daily. 60 g 4   . promethazine (PHENERGAN) 25 MG tablet Take 1 tablet (25 mg total) by mouth every 8 (eight) hours as needed for nausea or vomiting. 20 tablet 0   . traMADol (ULTRAM) 50 MG tablet Take 1 tablet (50 mg total) by mouth every 6 (six) hours as needed. 30 tablet 4    Allergies  Allergen Reactions  . Tegaderm Ag Mesh [Silver] Other (See Comments)    blisters  . Amoxicillin Hives and Rash    Has patient had a PCN reaction causing immediate rash, facial/tongue/throat swelling, SOB or lightheadedness with hypotension: Yes Has patient had a PCN reaction causing severe rash involving mucus membranes or skin necrosis: Yes Has patient had a PCN reaction that required hospitalization No Has patient had a PCN reaction  occurring within the last 10 years: Yes If all of the above answers are "NO", then may proceed with Cephalosporin use.   . Chlorthalidone Rash  . Sulfa Antibiotics Rash   Past Medical History:  Diagnosis Date  . Anemia   . Breast cancer ([place]) 2014   left breast LCIS  . History of kidney stones   . Hypertension   . LCIS September 05, 2013   Left breast, ER 90%, PR 60%  . Malignant neoplasm of ascending colon [facility]) November 2007   T2, N0.5.2 cm low-grade adenocarcinoma. 0/28 nodes positive.  . Personal history of colonic polyps   . Spinal headache 1995   at [facility] spinal made me feel dead, had no control.  . Stroke ([place]) 09/17/2010   Right thalamic without residual effects  . Tubulovillous adenoma polyp of colon 10/06/2017   2 polyps, 5 and 10 mm completely resected from the area of the ileocolic anastomosis.  Tubulovillous polyp with high-grade dysplasia.   Past Surgical History:  Procedure Laterality Date  . APPENDECTOMY  1995  . BREAST BIOPSY Right August 07, 2010   Sclerotic fibroadenoma removed from the right breast at the 10:00 position  . BREAST BIOPSY Left 08/14/2013   left breast superior LCIS. per path report: Pleomorphic LCIS has similar behavior to DCIS.  [name] BREAST BIOPSY Left 08/14/2013   left breast superior fibroadenoma  . BREAST BIOPSY Left 08/23/2017  stereo bx with Dr. Bary Castilla of lower inner quad. FIBROADENOMATOID STROMAL FIBROSIS WITH ASSOCIATED CALCIFICATIONS.   [name] BREAST BIOPSY    . BREAST LUMPECTOMY Left 09/05/2013   LCIS, clear margins  . BREAST SURGERY Left 09/05/13   LCIS, ER 90%, PR 60%, negative margins.  . COLONOSCOPY  August 03, 2012   8 mm tubulovillous adenoma removed from the transverse colon. Followup exam in 2018 plan.  . COLONOSCOPY WITH PROPOFOL N/A 10/06/2017   Procedure: COLONOSCOPY WITH PROPOFOL;  Surgeon: Robert Bellow, MD;  Location: [facility] ENDOSCOPY;  Service: Endoscopy;  Laterality: N/A;  . DOPPLER ECHOCARDIOGRAPHY  09/17/2010    EF 55%, Mild to moderate MR and mild to moderate TR  . HEEL SPUR EXCISION Left 2005  . JOINT REPLACEMENT Right 2012  . KNEE ARTHROPLASTY Left 10/26/2016   Procedure: COMPUTER ASSISTED TOTAL KNEE ARTHROPLASTY;  Surgeon: Dereck Leep, MD;  Location: [facility] ORS;  Service: Orthopedics;  Laterality: Left;  . LUMBAR DISC SURGERY  2009   L4-5 microdiscectomy Dr. Mauri Pole  . OVARIAN CYST REMOVAL  1980  . PARTIAL COLECTOMY Right 2007   Dr. Bary Castilla  . REPLACEMENT TOTAL KNEE Right 12/2010   Social History   Socioeconomic History  . Marital status: Widowed    Spouse name: Not on file  . Number of children: 2  . Years of education: Not on file  . Highest education level: Associate degree: academic program  Occupational History  . Occupation: Retired  Tobacco Use  . Smoking status: Never Smoker  . Smokeless tobacco: Never Used  Vaping Use  . Vaping Use: Never used  Substance and Sexual Activity  . Alcohol use: No  . Drug use: No  . Sexual activity: Not on file  Other Topics Concern  . Not on file  Social History Narrative  . Not on file   Social Determinants of Health   Financial Resource Strain: Low Risk   . Difficulty of Paying Living Expenses: Not hard at all  Food Insecurity: No Food Insecurity  . Worried About Charity fundraiser in the Last Year: Never true  . Ran Out of Food in the Last Year: Never true  Transportation Needs: No Transportation Needs  . Lack of Transportation (Medical): No  . Lack of Transportation (Non-Medical): No  Physical Activity: Sufficiently Active  . Days of Exercise per Week: 5 days  . Minutes of Exercise per Session: 30 min  Stress: No Stress Concern Present  . Feeling of Stress : Only a little  Social Connections: Socially Isolated  . Frequency of Communication with Friends and Family: More than three times a week  . Frequency of Social Gatherings with Friends and Family: More than three times a week  . Attends Religious Services: Never  .  Active Member of Clubs or Organizations: No  . Attends Archivist Meetings: Never  . Marital Status: Widowed  Intimate Partner Violence: Not At Risk  . Fear of Current or Ex-Partner: No  . Emotionally Abused: No  . Physically Abused: No  . Sexually Abused: No   Social History   Social History Narrative  . Not on file     ROS: Negative.     PE: HEENT: Negative. Lungs: Clear. Cardio: RR.  Assessment/Plan:  Proceed with planned endoscopy.  Forest Gleason [facility] 11/20/2020

## 2020-11-20 NOTE — Transfer of Care (Signed)
Immediate Anesthesia Transfer of Care Note  Patient: Lindsay Russo  Procedure(s) Performed: COLONOSCOPY WITH PROPOFOL (N/A )  Patient Location: PACU  Anesthesia Type:General  Level of Consciousness: drowsy  Airway & Oxygen Therapy: Patient Spontanous Breathing  Post-op Assessment: Report given to RN and Post -op Vital signs reviewed and stable  Post vital signs: Reviewed and stable  Last Vitals:  Vitals Value Taken Time  BP    Temp    Pulse    Resp    SpO2      Last Pain:  Vitals:   11/20/20 0720  TempSrc: Skin  PainSc: 0-No pain         Complications: No complications documented.

## 2020-11-21 ENCOUNTER — Encounter: Payer: Self-pay | Admitting: General Surgery

## 2020-11-21 LAB — SURGICAL PATHOLOGY

## 2020-11-25 ENCOUNTER — Other Ambulatory Visit: Payer: Self-pay | Admitting: Family Medicine

## 2020-11-25 DIAGNOSIS — I10 Essential (primary) hypertension: Secondary | ICD-10-CM

## 2020-12-11 ENCOUNTER — Other Ambulatory Visit: Payer: Self-pay | Admitting: Family Medicine

## 2020-12-11 DIAGNOSIS — M545 Low back pain, unspecified: Secondary | ICD-10-CM

## 2020-12-11 DIAGNOSIS — G8929 Other chronic pain: Secondary | ICD-10-CM

## 2020-12-11 NOTE — Telephone Encounter (Signed)
Requested medication (s) are due for refill today:no  Requested medication (s) are on the active medication list: yes  Last refill:  10/24/2019  Future visit scheduled:yes  Notes to clinic:  this refill cannot be delegated    Requested Prescriptions  Pending Prescriptions Disp Refills   traMADol (ULTRAM) 50 MG tablet [Pharmacy Med Name: TRAMADOL HCL 50 MG TAB] 30 tablet     Sig: TAKE 1 TABLET BY MOUTH EVERY 6 HOURS AS NEEDED      Not Delegated - Analgesics:  Opioid Agonists Failed - 12/11/2020  2:25 PM      Failed - This refill cannot be delegated      Failed - Urine Drug Screen completed in last 360 days      Passed - Valid encounter within last 6 months    Recent Outpatient Visits           5 months ago [place], [place]   6 months ago Ulcer aphthous oral   [facility] Birdie Sons, MD   6 months ago Ulcer aphthous oral   [facility] Birdie Sons, MD   7 months ago Annual physical exam   [facility] Birdie Sons, MD   1 year ago Thumb tendonitis   [facility] Birdie Sons, MD       Future Appointments             In 4 months Fisher, Kirstie Peri, MD [facility], [place]

## 2021-01-14 ENCOUNTER — Telehealth (INDEPENDENT_AMBULATORY_CARE_PROVIDER_SITE_OTHER): Payer: Medicare Other | Admitting: Family Medicine

## 2021-01-14 ENCOUNTER — Encounter: Payer: Self-pay | Admitting: Family Medicine

## 2021-01-14 DIAGNOSIS — I10 Essential (primary) hypertension: Secondary | ICD-10-CM

## 2021-01-14 DIAGNOSIS — R7303 Prediabetes: Secondary | ICD-10-CM

## 2021-01-14 DIAGNOSIS — R5383 Other fatigue: Secondary | ICD-10-CM

## 2021-01-14 DIAGNOSIS — G4709 Other insomnia: Secondary | ICD-10-CM

## 2021-01-14 DIAGNOSIS — E782 Mixed hyperlipidemia: Secondary | ICD-10-CM

## 2021-01-14 MED ORDER — TRAZODONE HCL 50 MG PO TABS
50.0000 mg | ORAL_TABLET | Freq: Every evening | ORAL | 3 refills | Status: DC | PRN
Start: 2021-01-14 — End: 2023-11-19

## 2021-01-14 NOTE — Progress Notes (Addendum)
MyChart Video Visit    Virtual Visit via Video Note   This visit type was conducted due to national recommendations for restrictions regarding the COVID-19 Pandemic (e.g. social distancing) in an effort to limit this patient's exposure and mitigate transmission in our community. This patient is at least at moderate risk for complications without adequate follow up. This format is felt to be most appropriate for this patient at this time. Physical exam was limited by quality of the video and audio technology used for the visit.   Patient location: home Provider location: bfp  I discussed the limitations of evaluation and management by telemedicine and the availability of in person appointments. The patient expressed understanding and agreed to proceed.  Patient: Lindsay Russo   DOB: 1946-09-07   75 y.o. Female  MRN: [id number] Visit Date: 01/14/2021  Today's healthcare provider: Mila Merry, MD   Chief Complaint  Patient presents with  . Fatigue   Subjective    HPI  Fatigue: Patient complains of fatigue for a few months and decreased appetite for the past year. She has noticed that recenty her urine seems darker than normal. Patient denies any pain during urination. She reports having some back pain, but states she has chronic back pain. Patient denies any change from her chronic back pain. She denies any nausea, vomiting, fever, chills or sweats.  No chest pains, no dyspnea. Not sleeping well since her husband passed away. Falls asleep easily at night but wakes up several times. Home blood pressures have  Lost about 10 pounds over the last two months, but no other GI symptoms.     Medications: Outpatient Medications Prior to Visit  Medication Sig  . acetaminophen (TYLENOL) 500 MG tablet Take 1,000 mg by mouth every 6 (six) hours as needed for moderate pain or headache.  [name] amLODipine (NORVASC) 10 MG tablet Take 1 tablet (10 mg total) by mouth daily.  . diclofenac Sodium  (VOLTAREN) 1 % GEL Apply topically 4 (four) times daily.  [name] lovastatin (MEVACOR) 40 MG tablet Take 1 tablet (40 mg total) by mouth at bedtime.  . meclizine (ANTIVERT) 25 MG tablet Take 1 tablet (25 mg total) by mouth 3 (three) times daily as needed for dizziness.  . nystatin (MYCOSTATIN/NYSTOP) powder Apply 1 application topically 3 (three) times daily.  . promethazine (PHENERGAN) 25 MG tablet Take 1 tablet (25 mg total) by mouth every 8 (eight) hours as needed for nausea or vomiting.  [name] spironolactone (ALDACTONE) 25 MG tablet TAKE 1 TABLET BY MOUTH ONCE EVERY MORNING  . traMADol (ULTRAM) 50 MG tablet TAKE 1 TABLET BY MOUTH EVERY 6 HOURS AS NEEDED  . [DISCONTINUED] fluconazole (DIFLUCAN) 150 MG tablet Take 1 tablet (150 mg total) by mouth daily. Hold lovastatin while on fluconazole (Patient not taking: Reported on 01/14/2021)   No facility-administered medications prior to visit.    Review of Systems  Constitutional: Positive for appetite change and fatigue. Negative for chills and fever.  Respiratory: Negative for chest tightness and shortness of breath.   Cardiovascular: Negative for chest pain and palpitations.  Gastrointestinal: Negative for abdominal pain, nausea and vomiting.  Genitourinary:       Darker colored urine  Neurological: Negative for dizziness and weakness.      Objective    There were no vitals taken for this visit.   Physical Exam   Awake, alert, oriented x 3. In no apparent distress   Assessment & Plan     1. Other insomnia  try- traZODone (DESYREL) 50 MG tablet; Take 1-2 tablets (50-100 mg total) by mouth at bedtime as needed for sleep.  Dispense: 30 tablet; Refill: 3  2. Primary hypertension  - Comprehensive metabolic panel  3. Other fatigue  - CBC - TSH - Comprehensive metabolic panel  4. Hyperlipidemia, mixed She is tolerating lovastatin well with no adverse effects.   - Lipid panel - TSH  5. Pre-diabetes  - Hemoglobin A1c   No follow-ups  on file.     I discussed the assessment and treatment plan with the patient. The patient was provided an opportunity to ask questions and all were answered. The patient agreed with the plan and demonstrated an understanding of the instructions.   The patient was advised to call back or seek an in-person evaluation if the symptoms worsen or if the condition fails to improve as anticipated.  I provided 12 minutes of non-face-to-face time during this encounter.  The entirety of the information documented in the History of Present Illness, Review of Systems and Physical Exam were personally obtained by me. Portions of this information were initially documented by the CMA and reviewed by me for thoroughness and accuracy.     Mila Merry, MD [facility] [phone] (phone) [phone] (fax)  [facility]

## 2021-01-15 DIAGNOSIS — R7303 Prediabetes: Secondary | ICD-10-CM | POA: Diagnosis not present

## 2021-01-15 DIAGNOSIS — I10 Essential (primary) hypertension: Secondary | ICD-10-CM | POA: Diagnosis not present

## 2021-01-15 DIAGNOSIS — R5383 Other fatigue: Secondary | ICD-10-CM | POA: Diagnosis not present

## 2021-01-15 DIAGNOSIS — E782 Mixed hyperlipidemia: Secondary | ICD-10-CM | POA: Diagnosis not present

## 2021-01-16 LAB — COMPREHENSIVE METABOLIC PANEL
ALT: 7 IU/L (ref 0–32)
AST: 18 IU/L (ref 0–40)
Albumin/Globulin Ratio: 1.7 (ref 1.2–2.2)
Albumin: 4.7 g/dL (ref 3.7–4.7)
Alkaline Phosphatase: 86 IU/L (ref 44–121)
BUN/Creatinine Ratio: 17 (ref 12–28)
BUN: 26 mg/dL (ref 8–27)
Bilirubin Total: 0.6 mg/dL (ref 0.0–1.2)
CO2: 21 mmol/L (ref 20–29)
Calcium: 9.9 mg/dL (ref 8.7–10.3)
Chloride: 102 mmol/L (ref 96–106)
Creatinine, Ser: 1.53 mg/dL — ABNORMAL HIGH (ref 0.57–1.00)
GFR calc Af Amer: 38 mL/min/{1.73_m2} — ABNORMAL LOW (ref >59)
GFR calc non Af Amer: 33 mL/min/{1.73_m2} — ABNORMAL LOW (ref >59)
Globulin, Total: 2.7 g/dL (ref 1.5–4.5)
Glucose: 94 mg/dL (ref 65–99)
Potassium: 4.9 mmol/L (ref 3.5–5.2)
Sodium: 141 mmol/L (ref 134–144)
Total Protein: 7.4 g/dL (ref 6.0–8.5)

## 2021-01-16 LAB — HEMOGLOBIN A1C
Est. average glucose Bld gHb Est-mCnc: 120 mg/dL
Hgb A1c MFr Bld: 5.8 % — ABNORMAL HIGH (ref 4.8–5.6)

## 2021-01-16 LAB — TSH: TSH: 2.54 u[IU]/mL (ref 0.450–4.500)

## 2021-01-16 LAB — LIPID PANEL
Chol/HDL Ratio: 3.6 ratio (ref 0.0–4.4)
Cholesterol, Total: 179 mg/dL (ref 100–199)
HDL: 50 mg/dL (ref >39)
LDL Chol Calc (NIH): 105 mg/dL — ABNORMAL HIGH (ref 0–99)
Triglycerides: 133 mg/dL (ref 0–149)
VLDL Cholesterol Cal: 24 mg/dL (ref 5–40)

## 2021-01-16 LAB — CBC
Hematocrit: 45.5 % (ref 34.0–46.6)
Hemoglobin: 14.9 g/dL (ref 11.1–15.9)
MCH: 29.2 pg (ref 26.6–33.0)
MCHC: 32.7 g/dL (ref 31.5–35.7)
MCV: 89 fL (ref 79–97)
Platelets: 241 10*3/uL (ref 150–450)
RBC: 5.1 x10E6/uL (ref 3.77–5.28)
RDW: 12.5 % (ref 11.7–15.4)
WBC: 12.1 10*3/uL — ABNORMAL HIGH (ref 3.4–10.8)

## 2021-01-17 ENCOUNTER — Telehealth: Payer: Self-pay | Admitting: *Deleted

## 2021-01-17 NOTE — Telephone Encounter (Signed)
Pt given results and recommendtaions per Dr Caryn Section, " Her kidney functions have declined significantly. Blood cell count is a little bit high. She needs to double the amount of water she is drinking every day. She needs to avoid any OTC anti-inflammatory medications such as ibuprofen or naprosyn. Tylenol and tramadol are OK to take. .She needs to schedule in office follow up in about 3 weeks. Will need to check urinalysis and some follow up blood work at that time"; she verbalized understanding; decision tree completed' the pt would like t be seen the week of 02/12/20; pt offered and accepted appointment with Dr Caryn Section, [facility], 02/12/20 at 1540; will route to office for notification; unable to chart in result note because encounter not created.[name]

## 2021-02-10 ENCOUNTER — Encounter: Payer: Self-pay | Admitting: Family Medicine

## 2021-02-10 ENCOUNTER — Other Ambulatory Visit: Payer: Self-pay

## 2021-02-10 ENCOUNTER — Ambulatory Visit (INDEPENDENT_AMBULATORY_CARE_PROVIDER_SITE_OTHER): Payer: Medicare Other | Admitting: Family Medicine

## 2021-02-10 VITALS — BP 151/66 | HR 66 | Ht 66.0 in | Wt 188.0 lb

## 2021-02-10 DIAGNOSIS — G4709 Other insomnia: Secondary | ICD-10-CM | POA: Diagnosis not present

## 2021-02-10 DIAGNOSIS — N183 Chronic kidney disease, stage 3 unspecified: Secondary | ICD-10-CM

## 2021-02-10 DIAGNOSIS — D72829 Elevated white blood cell count, unspecified: Secondary | ICD-10-CM

## 2021-02-10 DIAGNOSIS — I10 Essential (primary) hypertension: Secondary | ICD-10-CM

## 2021-02-10 DIAGNOSIS — R252 Cramp and spasm: Secondary | ICD-10-CM

## 2021-02-10 HISTORY — DX: Chronic kidney disease, stage 3 unspecified: N18.30

## 2021-02-10 LAB — POCT URINALYSIS DIPSTICK
Appearance: NORMAL
Bilirubin, UA: NEGATIVE
Glucose, UA: NEGATIVE
Ketones, UA: NEGATIVE
Leukocytes, UA: NEGATIVE
Nitrite, UA: NEGATIVE
Odor: NORMAL
Protein, UA: POSITIVE — AB
Spec Grav, UA: >=1.03 — AB (ref 1.010–1.025)
Urobilinogen, UA: 0.2 E.U./dL
pH, UA: 6 (ref 5.0–8.0)

## 2021-02-10 NOTE — Patient Instructions (Addendum)
Try taking OTC B-complex vitamin every day to help with leg cramps

## 2021-02-10 NOTE — Progress Notes (Signed)
Established patient visit   Patient: Lindsay Russo   DOB: 02-01-1946   75 y.o. Female  MRN: [id number] Visit Date: 02/10/2021  Today's healthcare provider: Lelon Huh, MD   No chief complaint on file.  Subjective    HPI  Urinalysis and f/u blood work - note to have decline in eGFR 3 weeks ago.  BMP Latest Ref Rng & Units 01/15/2021 04/30/2020 11/14/2019  Glucose 65 - 99 mg/dL 94 93 135(H)  BUN 8 - 27 mg/dL $Remove'26 17 16  'DcmTXsz$ Creatinine 0.57 - 1.00 mg/dL 1.53(H) 1.04(H) 1.13(H)  BUN/Creat Ratio 12 - $Re'28 17 16 14  'BAy$ Sodium 134 - 144 mmol/L 141 143 141  Potassium 3.5 - 5.2 mmol/L 4.9 4.4 4.3  Chloride 96 - 106 mmol/L 102 104 102  CO2 20 - 29 mmol/L $RemoveB'21 22 24  'PxHZiIQE$ Calcium 8.7 - 10.3 mg/dL 9.9 9.7 10.1   At that time she had been feeling very fatigued and not sleep well, which has improved. She has only taken trazodone a few times since then. She does have problems with leg cramps which keep her up at night.   She was also noted to have elevated WBC of 12.1.   She reports home blood pressure consistently in the 150-160 range. Taking medications consistently.       Medications: Outpatient Medications Prior to Visit  Medication Sig  . acetaminophen (TYLENOL) 500 MG tablet Take 1,000 mg by mouth every 6 (six) hours as needed for moderate pain or headache.  [name] amLODipine (NORVASC) 10 MG tablet Take 1 tablet (10 mg total) by mouth daily.  . diclofenac Sodium (VOLTAREN) 1 % GEL Apply topically 4 (four) times daily.  [name] lovastatin (MEVACOR) 40 MG tablet Take 1 tablet (40 mg total) by mouth at bedtime.  . meclizine (ANTIVERT) 25 MG tablet Take 1 tablet (25 mg total) by mouth 3 (three) times daily as needed for dizziness.  . nystatin (MYCOSTATIN/NYSTOP) powder Apply 1 application topically 3 (three) times daily.  . promethazine (PHENERGAN) 25 MG tablet Take 1 tablet (25 mg total) by mouth every 8 (eight) hours as needed for nausea or vomiting.  [name] spironolactone (ALDACTONE) 25 MG tablet TAKE 1  TABLET BY MOUTH ONCE EVERY MORNING  . traMADol (ULTRAM) 50 MG tablet TAKE 1 TABLET BY MOUTH EVERY 6 HOURS AS NEEDED  . traZODone (DESYREL) 50 MG tablet Take 1-2 tablets (50-100 mg total) by mouth at bedtime as needed for sleep.   No facility-administered medications prior to visit.    Review of Systems  Constitutional: Positive for fatigue. Negative for activity change, appetite change, chills and fever.  Respiratory: Negative for chest tightness and shortness of breath.   Cardiovascular: Positive for palpitations. Negative for chest pain.  Gastrointestinal: Negative for abdominal pain, nausea and vomiting.  Neurological: Negative for dizziness and weakness.       Objective    BP (!) 151/66 (BP Location: Right Arm, Patient Position: Sitting, Cuff Size: Normal)   Pulse 66   Ht $R'5\' 6"'jO$  (1.676 m)   Wt 188 lb (85.3 kg)   SpO2 100%   BMI 30.34 kg/m     Physical Exam   General: Appearance:    Obese female in no acute distress  Eyes:    PERRL, conjunctiva/corneas clear, EOM's intact       Lungs:     Clear to auscultation bilaterally, respirations unlabored  Heart:    Normal heart rate. Normal rhythm. No murmurs, rubs, or gallops.  MS:   All extremities are intact.   Neurologic:   Awake, alert, oriented x 3. No apparent focal neurological           defect.         Assessment & Plan     1. Primary hypertension Compliant with medications, but not to goal. Consider increasing spirolactone after reviewing labs.  - Renal function panel - Magnesium  2. Stage 3 chronic kidney disease, unspecified whether stage 3a or 3b CKD ([place]) Is doing better drink more water and cutting out soft drinks.  - VITAMIN D 25 Hydroxy (Vit-D Deficiency, Fractures) - Renal function panel  3. Leg cramps  - Magnesium  4. Leukocytosis, unspecified type  - CBC with Differential/Platelet   5. Insomnia Improved. Takes occasional trazodone.       The entirety of the information documented in the  History of Present Illness, Review of Systems and Physical Exam were personally obtained by me. Portions of this information were initially documented by the CMA and reviewed by me for thoroughness and accuracy.      Lelon Huh, MD  [facility] [phone] (phone) [phone] (fax)  [place]

## 2021-02-11 ENCOUNTER — Encounter: Payer: Self-pay | Admitting: Family Medicine

## 2021-02-11 ENCOUNTER — Ambulatory Visit: Payer: Self-pay | Admitting: Family Medicine

## 2021-02-11 ENCOUNTER — Other Ambulatory Visit: Payer: Self-pay | Admitting: Family Medicine

## 2021-02-11 DIAGNOSIS — I10 Essential (primary) hypertension: Secondary | ICD-10-CM

## 2021-02-11 DIAGNOSIS — E559 Vitamin D deficiency, unspecified: Secondary | ICD-10-CM | POA: Insufficient documentation

## 2021-02-11 LAB — CBC WITH DIFFERENTIAL/PLATELET
Basophils Absolute: 0.1 10*3/uL (ref 0.0–0.2)
Basos: 1 %
EOS (ABSOLUTE): 0.1 10*3/uL (ref 0.0–0.4)
Eos: 1 %
Hematocrit: 48.7 % — ABNORMAL HIGH (ref 34.0–46.6)
Hemoglobin: 15.9 g/dL (ref 11.1–15.9)
Immature Grans (Abs): 0 10*3/uL (ref 0.0–0.1)
Immature Granulocytes: 0 %
Lymphocytes Absolute: 1.9 10*3/uL (ref 0.7–3.1)
Lymphs: 19 %
MCH: 29.4 pg (ref 26.6–33.0)
MCHC: 32.6 g/dL (ref 31.5–35.7)
MCV: 90 fL (ref 79–97)
Monocytes Absolute: 0.5 10*3/uL (ref 0.1–0.9)
Monocytes: 5 %
Neutrophils Absolute: 7.3 10*3/uL — ABNORMAL HIGH (ref 1.4–7.0)
Neutrophils: 74 %
Platelets: 240 10*3/uL (ref 150–450)
RBC: 5.4 x10E6/uL — ABNORMAL HIGH (ref 3.77–5.28)
RDW: 12.8 % (ref 11.7–15.4)
WBC: 9.9 10*3/uL (ref 3.4–10.8)

## 2021-02-11 LAB — RENAL FUNCTION PANEL
Albumin: 4.8 g/dL — ABNORMAL HIGH (ref 3.7–4.7)
BUN/Creatinine Ratio: 13 (ref 12–28)
BUN: 16 mg/dL (ref 8–27)
CO2: 23 mmol/L (ref 20–29)
Calcium: 10.1 mg/dL (ref 8.7–10.3)
Chloride: 103 mmol/L (ref 96–106)
Creatinine, Ser: 1.27 mg/dL — ABNORMAL HIGH (ref 0.57–1.00)
Glucose: 117 mg/dL — ABNORMAL HIGH (ref 65–99)
Phosphorus: 3.5 mg/dL (ref 3.0–4.3)
Potassium: 4.7 mmol/L (ref 3.5–5.2)
Sodium: 143 mmol/L (ref 134–144)
eGFR: 44 mL/min/{1.73_m2} — ABNORMAL LOW (ref >59)

## 2021-02-11 LAB — VITAMIN D 25 HYDROXY (VIT D DEFICIENCY, FRACTURES): Vit D, 25-Hydroxy: 14.1 ng/mL — ABNORMAL LOW (ref 30.0–100.0)

## 2021-02-11 LAB — MAGNESIUM: Magnesium: 1.9 mg/dL (ref 1.6–2.3)

## 2021-02-11 MED ORDER — SPIRONOLACTONE 50 MG PO TABS: 50.0000 mg | ORAL_TABLET | Freq: Every day | ORAL | 0 refills | Status: DC

## 2021-03-19 DIAGNOSIS — L851 Acquired keratosis [keratoderma] palmaris et plantaris: Secondary | ICD-10-CM | POA: Diagnosis not present

## 2021-03-19 DIAGNOSIS — L578 Other skin changes due to chronic exposure to nonionizing radiation: Secondary | ICD-10-CM | POA: Diagnosis not present

## 2021-03-19 DIAGNOSIS — L570 Actinic keratosis: Secondary | ICD-10-CM | POA: Diagnosis not present

## 2021-03-19 DIAGNOSIS — B372 Candidiasis of skin and nail: Secondary | ICD-10-CM | POA: Diagnosis not present

## 2021-03-24 ENCOUNTER — Ambulatory Visit (INDEPENDENT_AMBULATORY_CARE_PROVIDER_SITE_OTHER): Payer: Medicare Other | Admitting: Family Medicine

## 2021-03-24 ENCOUNTER — Other Ambulatory Visit: Payer: Self-pay

## 2021-03-24 ENCOUNTER — Encounter: Payer: Self-pay | Admitting: Family Medicine

## 2021-03-24 VITALS — BP 121/73 | HR 69 | Temp 98.4°F | Resp 16 | Wt 188.0 lb

## 2021-03-24 DIAGNOSIS — E559 Vitamin D deficiency, unspecified: Secondary | ICD-10-CM | POA: Diagnosis not present

## 2021-03-24 DIAGNOSIS — I10 Essential (primary) hypertension: Secondary | ICD-10-CM | POA: Diagnosis not present

## 2021-03-24 DIAGNOSIS — R252 Cramp and spasm: Secondary | ICD-10-CM | POA: Diagnosis not present

## 2021-03-24 MED ORDER — TIZANIDINE HCL 2 MG PO TABS: 2.0000 mg | ORAL_TABLET | Freq: Every evening | ORAL | 1 refills | Status: DC

## 2021-03-24 NOTE — Progress Notes (Signed)
Established patient visit   Patient: Lindsay Russo   DOB: Feb 11, 1946   75 y.o. Female  MRN: [id number] Visit Date: 03/24/2021  Today's healthcare provider: Lelon Huh, MD   Chief Complaint  Patient presents with  . Hypertension   Subjective    HPI  Hypertension, follow-up  BP Readings from Last 3 Encounters:  02/10/21 (!) 151/66  11/20/20 (!) 110/96  06/24/20 (!) 131/76   Wt Readings from Last 3 Encounters:  02/10/21 188 lb (85.3 kg)  11/20/20 195 lb (88.5 kg)  06/24/20 201 lb 9.6 oz (91.4 kg)     She was last seen for hypertension 1 months ago.  BP at that visit was 151/66. Management since that visit includes increase dose of spironolactone to 50 MG.  She reports good compliance with treatment. She is not having side effects.  She is following a Regular diet. She is exercising. She does not smoke.  Use of agents associated with hypertension: none.   Outside blood pressures are not checked . Symptoms: No chest pain No chest pressure  No palpitations No syncope  No dyspnea No orthopnea  No paroxysmal nocturnal dyspnea No lower extremity edema   Pertinent labs: Lab Results  Component Value Date   CHOL 179 01/15/2021   HDL 50 01/15/2021   LDLCALC 105 (H) 01/15/2021   TRIG 133 01/15/2021   CHOLHDL 3.6 01/15/2021   Lab Results  Component Value Date   NA 143 02/10/2021   K 4.7 02/10/2021   CREATININE 1.27 (H) 02/10/2021   GFRNONAA 33 (L) 01/15/2021   GFRAA 38 (L) 01/15/2021   GLUCOSE 117 (H) 02/10/2021     The 10-year ASCVD risk score Mikey Bussing DC Jr., et al., 2013) is: 27.8%   --------------------------------------------------------------------------------------------------- She does continue to have leg cramps shortly after lying down in bed at night. Has tried OTC b complex daily for last month without relief. Has not yet started vitamin D supplement which was 14.1 which last checked 02/10/2021.     Medications: Outpatient Medications Prior  to Visit  Medication Sig  . acetaminophen (TYLENOL) 500 MG tablet Take 1,000 mg by mouth every 6 (six) hours as needed for moderate pain or headache.  [name] amLODipine (NORVASC) 10 MG tablet Take 1 tablet (10 mg total) by mouth daily.  . diclofenac Sodium (VOLTAREN) 1 % GEL Apply topically 4 (four) times daily.  [name] lovastatin (MEVACOR) 40 MG tablet Take 1 tablet (40 mg total) by mouth at bedtime.  . meclizine (ANTIVERT) 25 MG tablet Take 1 tablet (25 mg total) by mouth 3 (three) times daily as needed for dizziness.  . nystatin (MYCOSTATIN/NYSTOP) powder Apply 1 application topically 3 (three) times daily.  . promethazine (PHENERGAN) 25 MG tablet Take 1 tablet (25 mg total) by mouth every 8 (eight) hours as needed for nausea or vomiting.  [name] spironolactone (ALDACTONE) 50 MG tablet Take 1 tablet (50 mg total) by mouth daily.  . traMADol (ULTRAM) 50 MG tablet TAKE 1 TABLET BY MOUTH EVERY 6 HOURS AS NEEDED  . traZODone (DESYREL) 50 MG tablet Take 1-2 tablets (50-100 mg total) by mouth at bedtime as needed for sleep.   No facility-administered medications prior to visit.    Review of Systems  Constitutional: Negative for appetite change, chills, fatigue and fever.  Respiratory: Negative for chest tightness and shortness of breath.   Cardiovascular: Negative for chest pain and palpitations.  Gastrointestinal: Negative for abdominal pain, nausea and vomiting.  Musculoskeletal: Positive for myalgias (cramps  in back of legs and feet).  Neurological: Negative for dizziness and weakness.       Objective    BP 121/73 (BP Location: Left Arm, Patient Position: Sitting, Cuff Size: Large)   Pulse 69   Temp 98.4 F (36.9 C) (Oral)   Resp 16   Wt 188 lb (85.3 kg)   SpO2 97% Comment: room air  BMI 30.34 kg/m     Physical Exam   General appearance: Overweight female, cooperative and in no acute distress Head: Normocephalic, without obvious abnormality, atraumatic Respiratory: Respirations even and  unlabored, normal respiratory rate Extremities: All extremities are intact.  Skin: Skin color, texture, turgor normal. No rashes seen  Psych: Appropriate mood and affect. Neurologic: Mental status: Alert, oriented to person, place, and time, thought content appropriate.     Assessment & Plan     1. Primary hypertension Much better with increased dose of spironolactone.  - Renal function panel  2. Leg cramp No better with B complex vitamins. Try  tiZANidine (ZANAFLEX) 2 MG tablet; Take 1-2 tablets (2-4 mg total) by mouth at bedtime. For leg cramps  Dispense: 30 tablet; Refill: 1  Can also try tonic water with quinine at bedtime.   3. Vitamin D deficiency Not yet started vitamin D supplement.         The entirety of the information documented in the History of Present Illness, Review of Systems and Physical Exam were personally obtained by me. Portions of this information were initially documented by the CMA and reviewed by me for thoroughness and accuracy.      Lelon Huh, MD  [facility] [phone] (phone) [phone] (fax)  [place]

## 2021-03-24 NOTE — Patient Instructions (Addendum)
.   Start taking vitamin D3 2,000 units once a day   Try a glass of tonic water every evening for legs cramps

## 2021-03-25 LAB — RENAL FUNCTION PANEL
Albumin: 4.6 g/dL (ref 3.7–4.7)
BUN/Creatinine Ratio: 20 (ref 12–28)
BUN: 25 mg/dL (ref 8–27)
CO2: 21 mmol/L (ref 20–29)
Calcium: 9.7 mg/dL (ref 8.7–10.3)
Chloride: 104 mmol/L (ref 96–106)
Creatinine, Ser: 1.27 mg/dL — ABNORMAL HIGH (ref 0.57–1.00)
Glucose: 104 mg/dL — ABNORMAL HIGH (ref 65–99)
Phosphorus: 4 mg/dL (ref 3.0–4.3)
Potassium: 4.2 mmol/L (ref 3.5–5.2)
Sodium: 142 mmol/L (ref 134–144)
eGFR: 44 mL/min/{1.73_m2} — ABNORMAL LOW (ref >59)

## 2021-03-29 ENCOUNTER — Encounter: Payer: Self-pay | Admitting: Family Medicine

## 2021-04-25 ENCOUNTER — Other Ambulatory Visit: Payer: Self-pay | Admitting: Family Medicine

## 2021-04-25 DIAGNOSIS — R252 Cramp and spasm: Secondary | ICD-10-CM

## 2021-04-25 NOTE — Telephone Encounter (Signed)
Requested medication (s) are due for refill today: yes  Requested medication (s) are on the active medication list: yes  Last refill: 03/22/21  Future visit scheduled: yes  Notes to clinic: not delegated    Requested Prescriptions  Pending Prescriptions Disp Refills   tiZANidine (ZANAFLEX) 2 MG tablet [Pharmacy Med Name: TIZANIDINE HCL 2 MG TAB] 30 tablet 1    Sig: TAKE 1 OR 2 TABLETS BY MOUTH AT BEDTIME FOR LEG CRAMPS      Not Delegated - Cardiovascular:  Alpha-2 Agonists - tizanidine Failed - 04/25/2021 12:25 PM      Failed - This refill cannot be delegated      Passed - Valid encounter within last 6 months    Recent Outpatient Visits           1 month ago Primary hypertension   [facility] Birdie Sons, MD   2 months ago Primary hypertension   [facility] Birdie Sons, MD   3 months ago Other insomnia   [facility] Birdie Sons, MD   10 months ago [place], [place]   10 months ago Ulcer aphthous oral   [place], [place]       Future Appointments             In 1 week Fisher, Kirstie Peri, MD [facility], [place]

## 2021-05-05 ENCOUNTER — Ambulatory Visit (INDEPENDENT_AMBULATORY_CARE_PROVIDER_SITE_OTHER): Payer: Medicare Other | Admitting: Family Medicine

## 2021-05-05 ENCOUNTER — Other Ambulatory Visit: Payer: Self-pay

## 2021-05-05 ENCOUNTER — Encounter: Payer: Self-pay | Admitting: Family Medicine

## 2021-05-05 VITALS — BP 121/72 | HR 64 | Temp 98.1°F | Resp 16 | Ht 66.0 in | Wt 186.0 lb

## 2021-05-05 DIAGNOSIS — I10 Essential (primary) hypertension: Secondary | ICD-10-CM | POA: Diagnosis not present

## 2021-05-05 DIAGNOSIS — R252 Cramp and spasm: Secondary | ICD-10-CM | POA: Diagnosis not present

## 2021-05-05 DIAGNOSIS — Z23 Encounter for immunization: Secondary | ICD-10-CM | POA: Diagnosis not present

## 2021-05-05 DIAGNOSIS — Z289 Immunization not carried out for unspecified reason: Secondary | ICD-10-CM | POA: Diagnosis not present

## 2021-05-05 DIAGNOSIS — R0989 Other specified symptoms and signs involving the circulatory and respiratory systems: Secondary | ICD-10-CM | POA: Diagnosis not present

## 2021-05-05 DIAGNOSIS — E559 Vitamin D deficiency, unspecified: Secondary | ICD-10-CM | POA: Diagnosis not present

## 2021-05-05 DIAGNOSIS — I700 Atherosclerosis of aorta: Secondary | ICD-10-CM

## 2021-05-05 DIAGNOSIS — L989 Disorder of the skin and subcutaneous tissue, unspecified: Secondary | ICD-10-CM

## 2021-05-05 DIAGNOSIS — Z0000 Encounter for general adult medical examination without abnormal findings: Secondary | ICD-10-CM

## 2021-05-05 MED ORDER — TETANUS-DIPHTH-ACELL PERTUSSIS 5-2.5-18.5 LF-MCG/0.5 IM SUSY: 0.5000 mL | PREFILLED_SYRINGE | Freq: Once | INTRAMUSCULAR | 0 refills | Status: AC

## 2021-05-05 MED ORDER — SHINGRIX 50 MCG/0.5ML IM SUSR: 0.5000 mL | Freq: Once | INTRAMUSCULAR | 0 refills | Status: AC

## 2021-05-05 NOTE — Progress Notes (Signed)
Annual Wellness Visit     Patient: Lindsay Russo, Female    DOB: 1946/10/26, 75 y.o.   MRN: [id number] Visit Date: 05/05/2021  Today's Provider: Lelon Huh, MD   Subjective    Lindsay Russo is a 75 y.o. female who presents today for her Annual Wellness Visit.     Medications: Outpatient Medications Prior to Visit  Medication Sig  . acetaminophen (TYLENOL) 500 MG tablet Take 1,000 mg by mouth every 6 (six) hours as needed for moderate pain or headache.  [name] amLODipine (NORVASC) 10 MG tablet Take 1 tablet (10 mg total) by mouth daily.  . Cholecalciferol (VITAMIN D) 50 MCG (2000 UT) CAPS Take 1 capsule by mouth daily.  . diclofenac Sodium (VOLTAREN) 1 % GEL Apply topically 4 (four) times daily.  [name] lovastatin (MEVACOR) 40 MG tablet Take 1 tablet (40 mg total) by mouth at bedtime.  . meclizine (ANTIVERT) 25 MG tablet Take 1 tablet (25 mg total) by mouth 3 (three) times daily as needed for dizziness.  . nystatin (MYCOSTATIN/NYSTOP) powder Apply 1 application topically 3 (three) times daily.  . promethazine (PHENERGAN) 25 MG tablet Take 1 tablet (25 mg total) by mouth every 8 (eight) hours as needed for nausea or vomiting.  [name] spironolactone (ALDACTONE) 50 MG tablet Take 1 tablet (50 mg total) by mouth daily.  [name] tiZANidine (ZANAFLEX) 2 MG tablet TAKE 1 OR 2 TABLETS BY MOUTH AT BEDTIME FOR LEG CRAMPS  . traMADol (ULTRAM) 50 MG tablet TAKE 1 TABLET BY MOUTH EVERY 6 HOURS AS NEEDED  . traZODone (DESYREL) 50 MG tablet Take 1-2 tablets (50-100 mg total) by mouth at bedtime as needed for sleep.   No facility-administered medications prior to visit.    Allergies  Allergen Reactions  . Tegaderm Ag Mesh [Silver] Other (See Comments)    blisters  . Amoxicillin Hives and Rash    Has patient had a PCN reaction causing immediate rash, facial/tongue/throat swelling, SOB or lightheadedness with hypotension: Yes Has patient had a PCN reaction causing severe rash involving mucus membranes or  skin necrosis: Yes Has patient had a PCN reaction that required hospitalization No Has patient had a PCN reaction occurring within the last 10 years: Yes If all of the above answers are "NO", then may proceed with Cephalosporin use.   . Chlorthalidone Rash  . Sulfa Antibiotics Rash    Patient Care Team: Birdie Sons, MD as PCP - General (Family Medicine) Bary Castilla, Forest Gleason, MD (General Surgery) Anell Barr, OD as Consulting Physician (Optometry) Marry Guan, Laurice Record, MD as Consulting Physician (Orthopedic Surgery) Jannet Mantis, MD (Dermatology)      Objective     Most recent functional status assessment: In your present state of health, do you have any difficulty performing the following activities: 05/05/2021  Hearing? N  Vision? N  Difficulty concentrating or making decisions? N  Walking or climbing stairs? N  Dressing or bathing? N  Doing errands, shopping? N  Some recent data might be hidden   Most recent fall risk assessment: Fall Risk  05/05/2021  Falls in the past year? 0  Number falls in past yr: 0  Injury with Fall? 0  Follow up Falls evaluation completed    Most recent depression screenings: PHQ 2/9 Scores 05/05/2021 02/10/2021  PHQ - 2 Score 0 0  PHQ- 9 Score 1 3   Most recent cognitive screening: 6CIT Screen 04/30/2020  What Year? 0 points  What month? 0 points  What time? 0 points  Count back from 20 0 points  Months in reverse 0 points  Repeat phrase 0 points  Total Score 0   Most recent Audit-C alcohol use screening Alcohol Use Disorder Test (AUDIT) 05/05/2021  1. How often do you have a drink containing alcohol? 0  2. How many drinks containing alcohol do you have on a typical day when you are drinking? 0  3. How often do you have six or more drinks on one occasion? 0  AUDIT-C Score 0  Alcohol Brief Interventions/Follow-up -   A score of 3 or more in women, and 4 or more in men indicates increased risk for alcohol abuse, EXCEPT if all of  the points are from question 1   No results found for any visits on 05/05/21.  Assessment & Plan     Annual wellness visit done today including the all of the following: Reviewed patient's Family Medical History Reviewed and updated list of patient's medical providers Assessment of cognitive impairment was done Assessed patient's functional ability Established a written schedule for health screening [place] Completed and Reviewed  Exercise Activities and Dietary recommendations Goals    . DIET - REDUCE SUGAR INTAKE     Recommend to cut back on sweets and dessert in diet to help aid with weight loss.        Immunization History  Administered Date(s) Administered  . Fluad Quad(high Dose 65+) 10/24/2019, 08/14/2020  . Influenza, High Dose Seasonal PF 12/21/2016, 10/04/2017, 08/22/2018  . PFIZER(Purple Top)SARS-COV-2 Vaccination 12/21/2019, 01/15/2020, 09/13/2020  . Pneumococcal Conjugate-13 11/12/2014  . Pneumococcal Polysaccharide-23 01/11/2012  . Tdap 04/06/2007    Health Maintenance  Topic Date Due  . Pneumococcal Vaccine 76-29 Years old (1 of 4 - PCV13) Never done  . Zoster Vaccines- Shingrix (1 of 2) Never done  . TETANUS/TDAP  04/05/2017  . COVID-19 Vaccine (4 - Booster for [place] series) 12/14/2020  . INFLUENZA VACCINE  06/30/2021  . MAMMOGRAM  09/12/2021  . COLONOSCOPY (Pts 45-78yrs Insurance coverage will need to be confirmed)  11/21/2023  . DEXA SCAN  05/23/2025  . Hepatitis C Screening  Completed  . PNA vac Low Risk Adult  Completed  . HPV VACCINES  Aged Out     Discussed health benefits of physical activity, and encouraged her to engage in regular exercise appropriate for her age and condition.      No follow-ups on file.     The entirety of the information documented in the History of Present Illness, Review of Systems and Physical Exam were personally obtained by me. Portions of this information were initially documented by the  CMA and reviewed by me for thoroughness and accuracy.      Lelon Huh, MD  [facility] [phone] (phone) [phone] (fax)  [place]

## 2021-05-05 NOTE — Progress Notes (Signed)
Complete physical     Patient: Lindsay Russo, Female    DOB: [birth year], 75 y.o.   MRN: [id number] Visit Date: 05/05/2021  Today's Provider: Lelon Huh, MD    Subjective    Lindsay Russo is a 75 y.o. female who presents today for a complete physical exam. She reports consuming a general diet. Home exercise routine includes walking daily. She generally feels fairly well. She reports sleeping fairly well. She does have additional problems to discuss today. Skin lesion of left hand. Her dermatologist froze several lesions in April, but another round lesion, bout 28mm in dm with slightly raised borders has since appeared on the dorsal aspect of her left hand.   HPI Hypertension, follow-up  BP Readings from Last 3 Encounters:  05/05/21 121/72  03/24/21 121/73  02/10/21 (!) 151/66   Wt Readings from Last 3 Encounters:  05/05/21 186 lb (84.4 kg)  03/24/21 188 lb (85.3 kg)  02/10/21 188 lb (85.3 kg)     She was last seen for hypertension 6 weeks ago.  BP at that visit was 121/73. Management since that visit includes continuing same medication.  She reports good compliance with treatment. She is not having side effects.  She is following a Regular diet. She is exercising. She does not smoke.  Use of agents associated with hypertension: none.   Outside blood pressures are checked occasionally. Symptoms: No chest pain No chest pressure  No palpitations No syncope  No dyspnea No orthopnea  No paroxysmal nocturnal dyspnea No lower extremity edema   Pertinent labs: Lab Results  Component Value Date   CHOL 179 01/15/2021   HDL 50 01/15/2021   LDLCALC 105 (H) 01/15/2021   TRIG 133 01/15/2021   CHOLHDL 3.6 01/15/2021   Lab Results  Component Value Date   NA 142 03/24/2021   K 4.2 03/24/2021   CREATININE 1.27 (H) 03/24/2021   GFRNONAA 33 (L) 01/15/2021   GFRAA 38 (L) 01/15/2021   GLUCOSE 104 (H) 03/24/2021     The 10-year ASCVD risk score Mikey Bussing DC Jr., et al.,  2013) is: 18.8%   ---------------------------------------------------------------------------------------------------  Vitamin D deficiency, follow-up  Lab Results  Component Value Date   VD25OH 14.1 (L) 02/10/2021   CALCIUM 9.7 03/24/2021   CALCIUM 10.1 02/10/2021  ) Wt Readings from Last 3 Encounters:  05/05/21 186 lb (84.4 kg)  03/24/21 188 lb (85.3 kg)  02/10/21 188 lb (85.3 kg)    She was last seen for vitamin D deficiency 6 weeks ago.  Management since that visit includes none. Patient had not yet started vitamin D supplement.  She reports good compliance with treatment. She is not having side effects.   Symptoms: No change in energy level No numbness or tingling  No bone pain No unexplained fracture   ---------------------------------------------------------------------------------------------------  Follow up for leg cramp:  The patient was last seen for this 6 weeks ago. Changes made at last visit include trying tiZANidine (ZANAFLEX) 2 MG tablet; Take 1-2 tablets (2-4 mg total) by mouth at bedtime. For leg cramps. Patient was also advised to try tonic water with quinine at bedtime.  She reports good compliance with treatment. She feels that condition is Improved. She is not having side effects.   -----------------------------------------------------------------------------------------   Lipid/Cholesterol, Follow-up  Last lipid panel Other pertinent labs  Lab Results  Component Value Date   CHOL 179 01/15/2021   HDL 50 01/15/2021   LDLCALC 105 (H) 01/15/2021   TRIG 133 01/15/2021  CHOLHDL 3.6 01/15/2021   Lab Results  Component Value Date   ALT 7 01/15/2021   AST 18 01/15/2021   PLT 240 02/10/2021   TSH 2.540 01/15/2021     She was last seen for this 1 year ago.  Management since that visit includes continuing same medication.  She reports good compliance with treatment. She is not having side effects.   Symptoms: No chest pain No chest  pressure/discomfort  No dyspnea No lower extremity edema  No numbness or tingling of extremity No orthopnea  No palpitations No paroxysmal nocturnal dyspnea  No speech difficulty No syncope   Current diet: well balanced Current exercise: walking  The 10-year ASCVD risk score Mikey Bussing DC Jr., et al., 2013) is: 18.8%  ---------------------------------------------------------------------------------------------------  Prediabetes, Follow-up  Lab Results  Component Value Date   HGBA1C 5.8 (H) 01/15/2021   HGBA1C 5.8 (H) 04/30/2020   HGBA1C 5.7 (A) 10/24/2019   GLUCOSE 104 (H) 03/24/2021   GLUCOSE 117 (H) 02/10/2021   GLUCOSE 94 01/15/2021    Last seen for for this1 year ago.  Management since that visit includes continuing a healthy diet. Current symptoms include none and have been stable.  Prior visit with dietician: no Current diet: well balanced Current exercise: walking  Pertinent Labs:    Component Value Date/Time   CHOL 179 01/15/2021 1318   TRIG 133 01/15/2021 1318   CHOLHDL 3.6 01/15/2021 1318   CREATININE 1.27 (H) 03/24/2021 1418   CREATININE 1.12 05/04/2013 2326    Wt Readings from Last 3 Encounters:  05/05/21 186 lb (84.4 kg)  03/24/21 188 lb (85.3 kg)  02/10/21 188 lb (85.3 kg)    -----------------------------------------------------------------------------------------     Medications: Outpatient Medications Prior to Visit  Medication Sig  . acetaminophen (TYLENOL) 500 MG tablet Take 1,000 mg by mouth every 6 (six) hours as needed for moderate pain or headache.  [name] amLODipine (NORVASC) 10 MG tablet Take 1 tablet (10 mg total) by mouth daily.  . Cholecalciferol (VITAMIN D) 50 MCG (2000 UT) CAPS Take 1 capsule by mouth daily.  . diclofenac Sodium (VOLTAREN) 1 % GEL Apply topically 4 (four) times daily.  [name] lovastatin (MEVACOR) 40 MG tablet Take 1 tablet (40 mg total) by mouth at bedtime.  . meclizine (ANTIVERT) 25 MG tablet Take 1 tablet (25 mg total) by  mouth 3 (three) times daily as needed for dizziness.  . nystatin (MYCOSTATIN/NYSTOP) powder Apply 1 application topically 3 (three) times daily.  . promethazine (PHENERGAN) 25 MG tablet Take 1 tablet (25 mg total) by mouth every 8 (eight) hours as needed for nausea or vomiting.  [name] spironolactone (ALDACTONE) 50 MG tablet Take 1 tablet (50 mg total) by mouth daily.  [name] tiZANidine (ZANAFLEX) 2 MG tablet TAKE 1 OR 2 TABLETS BY MOUTH AT BEDTIME FOR LEG CRAMPS  . traMADol (ULTRAM) 50 MG tablet TAKE 1 TABLET BY MOUTH EVERY 6 HOURS AS NEEDED  . traZODone (DESYREL) 50 MG tablet Take 1-2 tablets (50-100 mg total) by mouth at bedtime as needed for sleep.   No facility-administered medications prior to visit.    Allergies  Allergen Reactions  . Tegaderm Ag Mesh [Silver] Other (See Comments)    blisters  . Amoxicillin Hives and Rash    Has patient had a PCN reaction causing immediate rash, facial/tongue/throat swelling, SOB or lightheadedness with hypotension: Yes Has patient had a PCN reaction causing severe rash involving mucus membranes or skin necrosis: Yes Has patient had a PCN reaction that required hospitalization  No Has patient had a PCN reaction occurring within the last 10 years: Yes If all of the above answers are "NO", then may proceed with Cephalosporin use.   . Chlorthalidone Rash  . Sulfa Antibiotics Rash    Patient Care Team: Birdie Sons, MD as PCP - General (Family Medicine) Bary Castilla, Forest Gleason, MD (General Surgery) Anell Barr, OD as Consulting Physician (Optometry) Marry Guan, Laurice Record, MD as Consulting Physician (Orthopedic Surgery) Jannet Mantis, MD (Dermatology)  Review of Systems  Constitutional: Negative for chills, fatigue and fever.  HENT: Negative for congestion, ear pain, rhinorrhea, sneezing and sore throat.   Eyes: Negative.  Negative for pain and redness.  Respiratory: Negative for cough, shortness of breath and wheezing.   Cardiovascular: Negative for  chest pain and leg swelling.  Gastrointestinal: Negative for abdominal pain, blood in stool, constipation, diarrhea and nausea.  Endocrine: Negative for polydipsia and polyphagia.  Genitourinary: Negative.  Negative for dysuria, flank pain, hematuria, pelvic pain, vaginal bleeding and vaginal discharge.  Musculoskeletal: Negative for arthralgias, back pain, gait problem and joint swelling.  Skin: Negative for rash.       Skin lesion on the back of left hand, and small mass under the skin of left lower forearm  Neurological: Negative.  Negative for dizziness, tremors, seizures, weakness, light-headedness, numbness and headaches.  Hematological: Negative for adenopathy.  Psychiatric/Behavioral: Negative.  Negative for behavioral problems, confusion and dysphoric mood. The patient is not nervous/anxious and is not hyperactive.         Objective    Vitals: BP 121/72 (BP Location: Left Arm, Patient Position: Sitting, Cuff Size: Normal)   Pulse 64   Temp 98.1 F (36.7 C) (Temporal)   Resp 16   Ht 5\' 6"  (1.676 m)   Wt 186 lb (84.4 kg)   BMI 30.02 kg/m    Physical Exam   General Appearance:    Mildly obese female. Alert, cooperative, in no acute distress, appears stated age   Head:    Normocephalic, without obvious abnormality, atraumatic  Eyes:    PERRL, conjunctiva/corneas clear, EOM's intact, fundi    benign, both eyes  Ears:    Normal TM's and external ear canals, both ears  Neck:   Supple, symmetrical, trachea midline, no adenopathy;    thyroid:  no enlargement/tenderness/nodules; no carotid   bruit or JVD  Back:     Symmetric, no curvature, ROM normal, no CVA tenderness  Lungs:     Clear to auscultation bilaterally, respirations unlabored  Chest Wall:    No tenderness or deformity   Heart:    Normal heart rate. Normal rhythm. No murmurs, rubs, or gallops.  Right carotid bruit auscultated.   Breast Exam:    deferred  Abdomen:     Soft, non-tender, bowel sounds active all four  quadrants,    no masses, no organomegaly  Pelvic:    deferred  Extremities:   All extremities are intact. No cyanosis or edema  Pulses:   2+ and symmetric all extremities  Skin:   Skin color, texture, turgor normal, no rashes. Lesion on left hand as described in HPI.   Lymph nodes:   Cervical, supraclavicular, and axillary nodes normal  Neurologic:   CNII-XII intact, normal strength, sensation and reflexes    throughout    A score of 3 or more in women, and 4 or more in men indicates increased risk for alcohol abuse, EXCEPT if all of the points are from question 1   No  results found for any visits on 05/05/21.  Assessment & Plan     Annual wellness visit done today including the all of the following: Reviewed patient's Family Medical History Reviewed and updated list of patient's medical providers Assessment of cognitive impairment was done Assessed patient's functional ability Established a written schedule for health screening [place] Completed and Reviewed  Exercise Activities and Dietary recommendations Goals    . DIET - REDUCE SUGAR INTAKE     Recommend to cut back on sweets and dessert in diet to help aid with weight loss.        Immunization History  Administered Date(s) Administered  . Fluad Quad(high Dose 65+) 10/24/2019, 08/14/2020  . Influenza, High Dose Seasonal PF 12/21/2016, 10/04/2017, 08/22/2018  . PFIZER(Purple Top)SARS-COV-2 Vaccination 12/21/2019, 01/15/2020, 09/13/2020  . Pneumococcal Conjugate-13 11/12/2014  . Pneumococcal Polysaccharide-23 01/11/2012  . Tdap 04/06/2007    Health Maintenance  Topic Date Due  . Pneumococcal Vaccine 75-38 Years old (1 of 4 - PCV13) Never done  . Zoster Vaccines- Shingrix (1 of 2) Never done  . TETANUS/TDAP  04/05/2017  . COVID-19 Vaccine (4 - Booster for [place] series) 12/14/2020  . INFLUENZA VACCINE  06/30/2021  . MAMMOGRAM  09/12/2021  . COLONOSCOPY (Pts 45-11yrs Insurance coverage will need  to be confirmed)  11/21/2023  . DEXA SCAN  05/23/2025  . Hepatitis C Screening  Completed  . PNA vac Low Risk Adult  Completed  . HPV VACCINES  Aged Out     Discussed health benefits of physical activity, and encouraged her to engage in regular exercise appropriate for her age and condition.    1. Prescription for Shingrix. Vaccine not administered in office.   - Zoster Vaccine Adjuvanted [facility]) injection; Inject 0.5 mLs into the muscle once for 1 dose.  Dispense: 0.5 mL; Refill: 0  2. Prescription for Tdap. Vaccine not administered in office.   - Tdap ([place]) 5-2.5-18.5 LF-MCG/0.5 injection; Inject 0.5 mLs into the muscle once for 1 dose.  Dispense: 0.5 mL; Refill: 0  3. Aortic atherosclerosis (HCC) Asymptomatic. Compliant with medication.  Continue aggressive risk factor modification.    4. Right carotid bruit  - US Carotid Duplex Bilateral; Future  5. Primary hypertension Very well controlled.  - Renal function panel  6. Vitamin D deficiency On daily D supplement. - VITAMIN D 25 Hydroxy (Vit-D Deficiency, Fractures)  7. Leg cramps Much better, finding that tizanidine is very helpful  8. Skin lesion left hand Similar to other lesions recently frozen by her dermatologist. Applied Cryo-pen x 60 seconds. Advised to return to her dermatologist if lesion does not resolve in several days.        The entirety of the information documented in the History of Present Illness, Review of Systems and Physical Exam were personally obtained by me. Portions of this information were initially documented by the CMA and reviewed by me for thoroughness and accuracy.      Lelon Huh, MD  [facility] [phone] (phone) [phone] (fax)  [place]

## 2021-05-05 NOTE — Patient Instructions (Addendum)
.   I strongly recommend at least one booster dose (a third shot) of the Covid vaccine for all adults. People at high risk for serious Covid infections should have a second booster dose 4-6 months after the first booster.   . You are due for a Tdap (tetanus-diptheria-pertussis vaccine) which protects you from tetanus and whooping cough. Please check with your insurance plan or pharmacy regarding coverage for this vaccine.     The CDC recommends two doses of Shingrix (the shingles vaccine) separated by 2 to 6 months for adults age 51 years and older. I recommend checking with your pharmacy plan regarding coverage for this vaccine.

## 2021-05-06 ENCOUNTER — Other Ambulatory Visit: Payer: Self-pay | Admitting: Family Medicine

## 2021-05-06 DIAGNOSIS — I10 Essential (primary) hypertension: Secondary | ICD-10-CM

## 2021-05-06 LAB — RENAL FUNCTION PANEL
Albumin: 4.9 g/dL — ABNORMAL HIGH (ref 3.7–4.7)
BUN/Creatinine Ratio: 18 (ref 12–28)
BUN: 23 mg/dL (ref 8–27)
CO2: 23 mmol/L (ref 20–29)
Calcium: 10 mg/dL (ref 8.7–10.3)
Chloride: 105 mmol/L (ref 96–106)
Creatinine, Ser: 1.25 mg/dL — ABNORMAL HIGH (ref 0.57–1.00)
Glucose: 117 mg/dL — ABNORMAL HIGH (ref 65–99)
Phosphorus: 3.5 mg/dL (ref 3.0–4.3)
Potassium: 4.6 mmol/L (ref 3.5–5.2)
Sodium: 142 mmol/L (ref 134–144)
eGFR: 45 mL/min/{1.73_m2} — ABNORMAL LOW (ref >59)

## 2021-05-06 LAB — VITAMIN D 25 HYDROXY (VIT D DEFICIENCY, FRACTURES): Vit D, 25-Hydroxy: 26.4 ng/mL — ABNORMAL LOW (ref 30.0–100.0)

## 2021-05-06 MED ORDER — SPIRONOLACTONE 50 MG PO TABS
50.0000 mg | ORAL_TABLET | Freq: Every day | ORAL | 3 refills | Status: DC
Start: 2021-05-06 — End: 2022-05-14

## 2021-05-13 ENCOUNTER — Other Ambulatory Visit: Payer: Self-pay | Admitting: Family Medicine

## 2021-05-13 DIAGNOSIS — I10 Essential (primary) hypertension: Secondary | ICD-10-CM

## 2021-05-30 ENCOUNTER — Other Ambulatory Visit: Payer: Self-pay | Admitting: Family Medicine

## 2021-05-30 DIAGNOSIS — R252 Cramp and spasm: Secondary | ICD-10-CM

## 2021-05-30 NOTE — Telephone Encounter (Signed)
Requested medication (s) are due for refill today: yes  Requested medication (s) are on the active medication list:  yes   Last refill:  04/25/2021  Future visit scheduled: no  Notes to clinic:  This refill cannot be delegated   Requested Prescriptions  Pending Prescriptions Disp Refills   tiZANidine (ZANAFLEX) 2 MG tablet [Pharmacy Med Name: TIZANIDINE HCL 2 MG TAB] 30 tablet 5    Sig: TAKE 1 OR 2 TABLETS BY MOUTH AT BEDTIME FOR LEG CRAMPS      Not Delegated - Cardiovascular:  Alpha-2 Agonists - tizanidine Failed - 05/30/2021  8:27 AM      Failed - This refill cannot be delegated      Passed - Valid encounter within last 6 months    Recent Outpatient Visits           3 weeks ago Prescription for Shingrix. Vaccine not administered in office.    [place], [place]   2 months ago Primary hypertension   [facility] Birdie Sons, MD   3 months ago Primary hypertension   [facility] Birdie Sons, MD   4 months ago Other insomnia   [facility] Birdie Sons, MD   11 months ago [facility], [place], [place]

## 2021-06-04 ENCOUNTER — Other Ambulatory Visit: Payer: Self-pay

## 2021-06-04 ENCOUNTER — Encounter: Payer: Self-pay | Admitting: Family Medicine

## 2021-06-04 ENCOUNTER — Ambulatory Visit
Admission: RE | Admit: 2021-06-04 | Discharge: 2021-06-04 | Disposition: A | Discharge status: Home / Self Care | Payer: Medicare Other | Source: Ambulatory Visit | Attending: Family Medicine | Admitting: Family Medicine

## 2021-06-04 DIAGNOSIS — I6523 Occlusion and stenosis of bilateral carotid arteries: Secondary | ICD-10-CM | POA: Diagnosis not present

## 2021-06-04 DIAGNOSIS — R0989 Other specified symptoms and signs involving the circulatory and respiratory systems: Secondary | ICD-10-CM | POA: Insufficient documentation

## 2021-06-05 ENCOUNTER — Encounter: Payer: Self-pay | Admitting: Family Medicine

## 2021-06-05 NOTE — Telephone Encounter (Signed)
Please see results notes. Patient was answered her questions and was scheduled for her 6 months follow up.

## 2021-06-17 DIAGNOSIS — L570 Actinic keratosis: Secondary | ICD-10-CM | POA: Diagnosis not present

## 2021-06-17 DIAGNOSIS — D485 Neoplasm of uncertain behavior of skin: Secondary | ICD-10-CM | POA: Diagnosis not present

## 2021-06-17 DIAGNOSIS — L820 Inflamed seborrheic keratosis: Secondary | ICD-10-CM | POA: Diagnosis not present

## 2021-06-26 DIAGNOSIS — H26493 Other secondary cataract, bilateral: Secondary | ICD-10-CM | POA: Diagnosis not present

## 2021-06-26 DIAGNOSIS — Z961 Presence of intraocular lens: Secondary | ICD-10-CM | POA: Diagnosis not present

## 2021-06-26 DIAGNOSIS — H35032 Hypertensive retinopathy, left eye: Secondary | ICD-10-CM | POA: Diagnosis not present

## 2021-07-09 ENCOUNTER — Other Ambulatory Visit: Payer: Self-pay | Admitting: Physician Assistant

## 2021-07-09 ENCOUNTER — Telehealth: Payer: Self-pay

## 2021-07-09 DIAGNOSIS — L304 Erythema intertrigo: Secondary | ICD-10-CM

## 2021-07-09 NOTE — Telephone Encounter (Signed)
Patient is calling to get refills on Fluconazole 150 mg. She uses this for rash under her breast.   She says this works so much better than the Nystatin powder.     [facility]

## 2021-07-10 DIAGNOSIS — L570 Actinic keratosis: Secondary | ICD-10-CM | POA: Diagnosis not present

## 2021-07-10 MED ORDER — FLUCONAZOLE 150 MG PO TABS: 150.0000 mg | ORAL_TABLET | Freq: Every day | ORAL | 0 refills | Status: DC

## 2021-07-23 ENCOUNTER — Telehealth: Payer: Self-pay

## 2021-07-23 NOTE — Telephone Encounter (Signed)
Apt 07/25/2021 at 10:40  Thanks,   -Mickel Baas

## 2021-07-23 NOTE — Telephone Encounter (Signed)
[place] called in to make an appt w/ Dr. Caryn Section.    She said her leg cramps are getting worse and going into her feet now and she cant sleep at night due to this.    I cant find an appt with Dr. Caryn Section and patient only wants to see Dr. Caryn Section.  Please advise when I can get her in for appt.

## 2021-07-25 ENCOUNTER — Other Ambulatory Visit: Payer: Self-pay

## 2021-07-25 ENCOUNTER — Ambulatory Visit: Payer: Medicare Other | Admitting: Family Medicine

## 2021-07-28 ENCOUNTER — Other Ambulatory Visit: Payer: Self-pay

## 2021-07-28 ENCOUNTER — Ambulatory Visit (INDEPENDENT_AMBULATORY_CARE_PROVIDER_SITE_OTHER): Payer: Medicare Other | Admitting: Family Medicine

## 2021-07-28 ENCOUNTER — Encounter: Payer: Self-pay | Admitting: Family Medicine

## 2021-07-28 VITALS — BP 139/85 | HR 66 | Temp 98.1°F | Resp 18 | Wt 182.0 lb

## 2021-07-28 DIAGNOSIS — R252 Cramp and spasm: Secondary | ICD-10-CM | POA: Diagnosis not present

## 2021-07-28 DIAGNOSIS — S90121A Contusion of right lesser toe(s) without damage to nail, initial encounter: Secondary | ICD-10-CM | POA: Diagnosis not present

## 2021-07-28 MED ORDER — LOVASTATIN 40 MG PO TABS: 40.0000 mg | ORAL_TABLET | Freq: Every day | ORAL | Status: DC

## 2021-07-28 NOTE — Patient Instructions (Addendum)
Please review the attached list of medications and notify my office if there are any errors.   Put lovastatin on hold for the next two weeks to see if is contributing to you foot cramps. We'll check in on you in about 2 weeks .

## 2021-07-28 NOTE — Progress Notes (Signed)
Established patient visit   Patient: Lindsay Russo   DOB: 1946/11/02   75 y.o. Female  MRN: [id number] Visit Date: 07/28/2021  Today's healthcare provider: Lelon Huh, MD   Chief Complaint  Patient presents with   Follow-up   Subjective  -------------------------------------------------------------------------------------------------------------------- HPI  Follow up for leg cramps:  The patient was last seen for this on 05/05/2021.   Changes made at last visit include none. Patient was to continue Tizanidine which seemed to be helpful.   She reports good compliance with treatment. She feels that condition is Worse. Patient states that the Tizanidine is no longer effective and her feet cramps are worse.  She is having side effects. (Tizanidine causes nausea after taking)  -----------------------------------------------------------------------------------------    Toe injury: Patient states she dropped a can on her right middle toe 4 days ago. She has swelling bruising and redness of this toe. She denies any pain or impaired mobility of the toes.     Medications: Outpatient Medications Prior to Visit  Medication Sig   acetaminophen (TYLENOL) 500 MG tablet Take 1,000 mg by mouth every 6 (six) hours as needed for moderate pain or headache.   amLODipine (NORVASC) 10 MG tablet TAKE 1 TABLET BY MOUTH ONCE DAILY   Cholecalciferol (VITAMIN D) 50 MCG (2000 UT) CAPS Take 1 capsule by mouth daily.   diclofenac Sodium (VOLTAREN) 1 % GEL Apply topically 4 (four) times daily.   lovastatin (MEVACOR) 40 MG tablet TAKE 1 TABLET BY MOUTH AT BEDTIME   meclizine (ANTIVERT) 25 MG tablet Take 1 tablet (25 mg total) by mouth 3 (three) times daily as needed for dizziness.   nystatin (MYCOSTATIN/NYSTOP) powder Apply 1 application topically 3 (three) times daily.   promethazine (PHENERGAN) 25 MG tablet Take 1 tablet (25 mg total) by mouth every 8 (eight) hours as needed for nausea or  vomiting.   spironolactone (ALDACTONE) 50 MG tablet Take 1 tablet (50 mg total) by mouth daily.   tiZANidine (ZANAFLEX) 2 MG tablet TAKE 1 OR 2 TABLETS BY MOUTH AT BEDTIME FOR LEG CRAMPS   traMADol (ULTRAM) 50 MG tablet TAKE 1 TABLET BY MOUTH EVERY 6 HOURS AS NEEDED   traZODone (DESYREL) 50 MG tablet Take 1-2 tablets (50-100 mg total) by mouth at bedtime as needed for sleep.   fluconazole (DIFLUCAN) 150 MG tablet TAKE 1 TABLET BY MOUTH ONCE DAILY (HOLD LOVASTATIN WHILE ON FLUCONAZOLE) (Patient not taking: Reported on 07/28/2021)   fluconazole (DIFLUCAN) 150 MG tablet Take 1 tablet (150 mg total) by mouth daily. Hold lovastatin while on fluconazole (Patient not taking: Reported on 07/28/2021)   No facility-administered medications prior to visit.    Review of Systems  Constitutional:  Negative for appetite change, chills, fatigue and fever.  Respiratory:  Negative for chest tightness and shortness of breath.   Cardiovascular:  Negative for chest pain and palpitations.  Gastrointestinal:  Negative for abdominal pain, nausea and vomiting.  Musculoskeletal:  Positive for myalgias.  Neurological:  Negative for dizziness and weakness.  Hematological:  Bruises/bleeds easily.      Objective  -------------------------------------------------------------------------------------------------------------------- BP 139/85 (BP Location: Right Arm, Patient Position: Sitting, Cuff Size: Normal)   Pulse 66   Temp 98.1 F (36.7 C) (Temporal)   Resp 18   Wt 182 lb (82.6 kg)   BMI 29.38 kg/m  Physical Exam   Mild swelling right 4th toe, FROM.     Assessment & Plan  ---------------------------------------------------------------------------------------------------------------------- 1. Contusion of toe of right foot, unspecified toe,  initial encounter   2. Leg cramps [place] are much better with tizanidine, but still having foot cramps, on B complex vitamins and vitamin D. Consider magnesium  supplements   Put lovastatin on hold and will check in with her in a couple of weeks.         The entirety of the information documented in the History of Present Illness, Review of Systems and Physical Exam were personally obtained by me. Portions of this information were initially documented by the CMA and reviewed by me for thoroughness and accuracy.     Lelon Huh, MD  [facility] [phone] (phone) [phone] (fax)  [place]

## 2021-08-18 ENCOUNTER — Other Ambulatory Visit: Payer: Self-pay | Admitting: Family Medicine

## 2021-08-18 ENCOUNTER — Telehealth: Payer: Self-pay | Admitting: Family Medicine

## 2021-08-18 DIAGNOSIS — Z1231 Encounter for screening mammogram for malignant neoplasm of breast: Secondary | ICD-10-CM

## 2021-08-18 NOTE — Telephone Encounter (Signed)
Pt is calling because Dr. Caryn Section took the pt off of lovastatin (MEVACOR) 40 MG tablet [id number] because she was having leg cramps. Pt is still having the leg cramps. Pt has not had the lovastatin. Pt would like to know should she return to the lovastatin. Pt feels that the tiZANidine (ZANAFLEX) 2 MG tablet [id number] has helped the leg cramps. But she is still having the cramps some.  CB- [phone]

## 2021-09-01 ENCOUNTER — Telehealth: Payer: Self-pay

## 2021-09-01 NOTE — Telephone Encounter (Signed)
Patient left a message on 08/18/21 and no one has returned her call. She wanted to let Dr. Caryn Section know she stopped Tizanidine and started taking Lovastatin again because she didn't want her cholesterol to be high when she has it checked again.

## 2021-09-03 ENCOUNTER — Ambulatory Visit (INDEPENDENT_AMBULATORY_CARE_PROVIDER_SITE_OTHER): Payer: Medicare Other

## 2021-09-03 ENCOUNTER — Other Ambulatory Visit: Payer: Self-pay

## 2021-09-03 DIAGNOSIS — Z23 Encounter for immunization: Secondary | ICD-10-CM | POA: Diagnosis not present

## 2021-09-15 ENCOUNTER — Other Ambulatory Visit: Payer: Self-pay

## 2021-09-15 ENCOUNTER — Ambulatory Visit
Admission: RE | Admit: 2021-09-15 | Discharge: 2021-09-15 | Disposition: A | Discharge status: Home / Self Care | Payer: Medicare Other | Source: Ambulatory Visit | Attending: Family Medicine | Admitting: Family Medicine

## 2021-09-15 DIAGNOSIS — Z1231 Encounter for screening mammogram for malignant neoplasm of breast: Secondary | ICD-10-CM | POA: Insufficient documentation

## 2021-09-24 ENCOUNTER — Emergency Department: Payer: Medicare Other

## 2021-09-24 ENCOUNTER — Other Ambulatory Visit: Payer: Self-pay

## 2021-09-24 ENCOUNTER — Emergency Department
Admission: EM | Admit: 2021-09-24 | Discharge: 2021-09-24 | Disposition: A | Discharge status: Home / Self Care | Payer: Medicare Other | Attending: Emergency Medicine | Admitting: Emergency Medicine

## 2021-09-24 DIAGNOSIS — W010XXA Fall on same level from slipping, tripping and stumbling without subsequent striking against object, initial encounter: Secondary | ICD-10-CM | POA: Insufficient documentation

## 2021-09-24 DIAGNOSIS — R42 Dizziness and giddiness: Secondary | ICD-10-CM

## 2021-09-24 DIAGNOSIS — R55 Syncope and collapse: Secondary | ICD-10-CM | POA: Diagnosis not present

## 2021-09-24 DIAGNOSIS — Z853 Personal history of malignant neoplasm of breast: Secondary | ICD-10-CM | POA: Diagnosis not present

## 2021-09-24 DIAGNOSIS — I129 Hypertensive chronic kidney disease with stage 1 through stage 4 chronic kidney disease, or unspecified chronic kidney disease: Secondary | ICD-10-CM | POA: Insufficient documentation

## 2021-09-24 DIAGNOSIS — S300XXA Contusion of lower back and pelvis, initial encounter: Secondary | ICD-10-CM | POA: Insufficient documentation

## 2021-09-24 DIAGNOSIS — R519 Headache, unspecified: Secondary | ICD-10-CM | POA: Diagnosis not present

## 2021-09-24 DIAGNOSIS — R112 Nausea with vomiting, unspecified: Secondary | ICD-10-CM | POA: Diagnosis not present

## 2021-09-24 DIAGNOSIS — Z79899 Other long term (current) drug therapy: Secondary | ICD-10-CM | POA: Diagnosis not present

## 2021-09-24 DIAGNOSIS — Z9012 Acquired absence of left breast and nipple: Secondary | ICD-10-CM | POA: Diagnosis not present

## 2021-09-24 DIAGNOSIS — N183 Chronic kidney disease, stage 3 unspecified: Secondary | ICD-10-CM | POA: Insufficient documentation

## 2021-09-24 DIAGNOSIS — Z20822 Contact with and (suspected) exposure to covid-19: Secondary | ICD-10-CM | POA: Insufficient documentation

## 2021-09-24 DIAGNOSIS — M545 Low back pain, unspecified: Secondary | ICD-10-CM | POA: Diagnosis not present

## 2021-09-24 DIAGNOSIS — Z96653 Presence of artificial knee joint, bilateral: Secondary | ICD-10-CM | POA: Diagnosis not present

## 2021-09-24 DIAGNOSIS — Z043 Encounter for examination and observation following other accident: Secondary | ICD-10-CM | POA: Diagnosis not present

## 2021-09-24 DIAGNOSIS — S0990XA Unspecified injury of head, initial encounter: Secondary | ICD-10-CM | POA: Insufficient documentation

## 2021-09-24 DIAGNOSIS — R52 Pain, unspecified: Secondary | ICD-10-CM | POA: Diagnosis not present

## 2021-09-24 DIAGNOSIS — R110 Nausea: Secondary | ICD-10-CM

## 2021-09-24 DIAGNOSIS — H579 Unspecified disorder of eye and adnexa: Secondary | ICD-10-CM | POA: Diagnosis not present

## 2021-09-24 DIAGNOSIS — R404 Transient alteration of awareness: Secondary | ICD-10-CM | POA: Diagnosis not present

## 2021-09-24 DIAGNOSIS — M533 Sacrococcygeal disorders, not elsewhere classified: Secondary | ICD-10-CM | POA: Diagnosis not present

## 2021-09-24 DIAGNOSIS — Z743 Need for continuous supervision: Secondary | ICD-10-CM | POA: Diagnosis not present

## 2021-09-24 LAB — RESP PANEL BY RT-PCR (FLU A&B, COVID) ARPGX2
Influenza A by PCR: NEGATIVE
Influenza B by PCR: NEGATIVE
SARS Coronavirus 2 by RT PCR: NEGATIVE

## 2021-09-24 LAB — CBC
HCT: 43.6 % (ref 36.0–46.0)
Hemoglobin: 15.1 g/dL — ABNORMAL HIGH (ref 12.0–15.0)
MCH: 31.4 pg (ref 26.0–34.0)
MCHC: 34.6 g/dL (ref 30.0–36.0)
MCV: 90.6 fL (ref 80.0–100.0)
Platelets: 245 10*3/uL (ref 150–400)
RBC: 4.81 MIL/uL (ref 3.87–5.11)
RDW: 12.2 % (ref 11.5–15.5)
WBC: 10.8 10*3/uL — ABNORMAL HIGH (ref 4.0–10.5)
nRBC: 0 % (ref 0.0–0.2)

## 2021-09-24 LAB — BASIC METABOLIC PANEL
Anion gap: 7 (ref 5–15)
BUN: 23 mg/dL (ref 8–23)
CO2: 25 mmol/L (ref 22–32)
Calcium: 9.2 mg/dL (ref 8.9–10.3)
Chloride: 106 mmol/L (ref 98–111)
Creatinine, Ser: 1.25 mg/dL — ABNORMAL HIGH (ref 0.44–1.00)
GFR, Estimated: 45 mL/min — ABNORMAL LOW (ref >60)
Glucose, Bld: 121 mg/dL — ABNORMAL HIGH (ref 70–99)
Potassium: 4.2 mmol/L (ref 3.5–5.1)
Sodium: 138 mmol/L (ref 135–145)

## 2021-09-24 LAB — HEPATIC FUNCTION PANEL
ALT: 13 U/L (ref 0–44)
AST: 20 U/L (ref 15–41)
Albumin: 4.1 g/dL (ref 3.5–5.0)
Alkaline Phosphatase: 55 U/L (ref 38–126)
Bilirubin, Direct: 0.1 mg/dL (ref 0.0–0.2)
Indirect Bilirubin: 0.8 mg/dL (ref 0.3–0.9)
Total Bilirubin: 0.9 mg/dL (ref 0.3–1.2)
Total Protein: 7.4 g/dL (ref 6.5–8.1)

## 2021-09-24 LAB — URINALYSIS, ROUTINE W REFLEX MICROSCOPIC
Bilirubin Urine: NEGATIVE
Glucose, UA: NEGATIVE mg/dL
Hgb urine dipstick: NEGATIVE
Ketones, ur: 5 mg/dL — AB
Leukocytes,Ua: NEGATIVE
Nitrite: NEGATIVE
Protein, ur: NEGATIVE mg/dL
Specific Gravity, Urine: 1.015 (ref 1.005–1.030)
pH: 5 (ref 5.0–8.0)

## 2021-09-24 MED ORDER — ONDANSETRON HCL 4 MG PO TABS: 4.0000 mg | ORAL_TABLET | Freq: Three times a day (TID) | ORAL | 0 refills | Status: DC | PRN

## 2021-09-24 MED ORDER — LIDOCAINE 5 % EX PTCH
1.0000 | MEDICATED_PATCH | CUTANEOUS | Status: DC
  Administered 2021-09-24: 1 via TRANSDERMAL
  Filled 2021-09-24: qty 1

## 2021-09-24 NOTE — ED Triage Notes (Signed)
Pt states she fell back landing on her tail bone and hitting the back of her head last week, was not seen by anyone since the fall. Today the pt states she had a near syncopal episode, became real shaky with nausea broke out in a sweat. Pt is in NAD at present. Pt c/o pain to the tailbone and has had a HA since the fall.

## 2021-09-24 NOTE — ED Notes (Signed)
Dc instructions reviewed with pt and daughter. No questions or concernst at this time will follow up with pcp. Pt wheeled out of the department by daughter

## 2021-09-24 NOTE — ED Provider Notes (Signed)
[facility] Emergency Department Provider Note  ____________________________________________   Event Date/Time   First MD Initiated Contact with Patient 09/24/21 1357     (approximate)  I have reviewed the triage vital signs and the nursing notes.   HISTORY  Chief Complaint Near Syncope   HPI Lindsay Russo is a 75 y.o. female with a past medical history of HTN, kidney stones, anemia, CKD, CVA without residual deficits and breast cancer status postlumpectomy who presents for assessment of several complaints.  Patient states she has had nausea for about a month but started feeling particularly bad about 2 weeks ago when she tripped and fell hitting the back of her head and her lower back.  She states that her posterior headache is improved but she has developed a frontal headache last couple days.  She states her pain in her lower back has not gotten much better.  She states that her nausea has not improved at all either and that she will often feel dizzy or lightheaded and has some shakiness in her arms particular when she stands up quickly over the last couple days.  She denies any vision changes, vertigo, earache, sore throat, chest pain, cough, shortness of breath, fevers, abdominal pain, diarrhea, constipation, burning with urination rash or focal extremity weakness numbness tingling or pain.  No interim falls.  She is not on any blood thinners.  She think she has been hydrating adequately with water does not think she is dehydrated although notes her blood pressure has been little low sometimes when she stands up quickly.  No other acute concerns at this time.         Past Medical History:  Diagnosis Date   Anemia    Breast cancer ([place]) 2014   left breast LCIS   History of kidney stones    Hypertension    LCIS September 05, 2013   Left breast, ER 90%, PR 60%   Malignant neoplasm of ascending colon [facility]) November 2007   T2, N0.5.2 cm low-grade  adenocarcinoma. 0/28 nodes positive.   Personal history of colonic polyps    Spinal headache 1995   at [facility] spinal made me feel dead, had no control.   Stage 3 chronic kidney disease ([place]) 02/10/2021   Stroke ([place]) 09/17/2010   Right thalamic without residual effects   Tubulovillous adenoma polyp of colon 10/06/2017   2 polyps, 5 and 10 mm completely resected from the area of the ileocolic anastomosis.  Tubulovillous polyp with high-grade dysplasia.    Patient Active Problem List   Diagnosis Date Noted   Carotid artery plaque, bilateral 06/04/2021   Vitamin D deficiency 02/11/2021   Other insomnia 02/10/2021   Leukocytosis 02/10/2021   Leg cramps 02/10/2021   Stage 3 chronic kidney disease ([place]) 02/10/2021   Aortic atherosclerosis ([place]) 01/06/2019   Rash due to allergy 08/08/2018   Restless leg syndrome 08/08/2018   Breast calcification, left 08/16/2017   S/P total knee arthroplasty 10/26/2016   Allergic rhinitis 11/21/2015   Personal history of urinary calculi 11/21/2015   History of migraine 11/21/2015   CN (constipation) 11/21/2015   Arthritis 11/21/2015   Vertigo 05/13/2015   Senile purpura ([place]) 05/13/2015   Cerebrovascular disease 03/26/2015   Degenerative disc disease, lumbar 03/26/2015   Obesity 03/26/2015   Hyperlipidemia, mixed 03/26/2015   Hypertension 03/26/2015   Pre-diabetes 03/26/2015   History of left breast cancer 08/21/2014   Lobular carcinoma in situ 09/26/2013   Breast neoplasm, T[place] (LCIS) 09/12/2013  Breast microcalcification, mammographic 07/26/2013   History of CVA (cerebrovascular accident) 09/17/2010   History of colon cancer 09/30/2006    Past Surgical History:  Procedure Laterality Date   APPENDECTOMY  1995   BREAST BIOPSY Right August 07, 2010   Sclerotic fibroadenoma removed from the right breast at the 10:00 position   BREAST BIOPSY Left 08/14/2013   left breast superior LCIS. per path report: Pleomorphic LCIS has similar behavior  to DCIS.   BREAST BIOPSY Left 08/14/2013   left breast superior fibroadenoma   BREAST BIOPSY Left 08/23/2017   stereo bx with Dr. Bary Castilla of lower inner quad. FIBROADENOMATOID STROMAL FIBROSIS WITH ASSOCIATED CALCIFICATIONS.    BREAST BIOPSY     BREAST LUMPECTOMY Left 09/05/2013   LCIS, clear margins   BREAST SURGERY Left 09/05/13   LCIS, ER 90%, PR 60%, negative margins.   COLONOSCOPY  August 03, 2012   8 mm tubulovillous adenoma removed from the transverse colon. Followup exam in 2018 plan.   COLONOSCOPY WITH PROPOFOL N/A 10/06/2017   Procedure: COLONOSCOPY WITH PROPOFOL;  Surgeon: Robert Bellow, MD;  Location: [facility] ENDOSCOPY;  Service: Endoscopy;  Laterality: N/A;   COLONOSCOPY WITH PROPOFOL N/A 11/20/2020   Procedure: COLONOSCOPY WITH PROPOFOL;  Surgeon: Robert Bellow, MD;  Location: [facility] ENDOSCOPY;  Service: Endoscopy;  Laterality: N/A;   DOPPLER ECHOCARDIOGRAPHY  09/17/2010   EF 55%, Mild to moderate MR and mild to moderate TR   HEEL SPUR EXCISION Left 2005   JOINT REPLACEMENT Right 2012   KNEE ARTHROPLASTY Left 10/26/2016   Procedure: COMPUTER ASSISTED TOTAL KNEE ARTHROPLASTY;  Surgeon: Dereck Leep, MD;  Location: [facility] ORS;  Service: Orthopedics;  Laterality: Left;   LUMBAR DISC SURGERY  2009   L4-5 microdiscectomy Dr. Mauri Pole   OVARIAN CYST REMOVAL  1980   PARTIAL COLECTOMY Right 2007   Dr. Bary Castilla   REPLACEMENT TOTAL KNEE Right 12/2010    Prior to Admission medications   Medication Sig Start Date End Date Taking? Authorizing Provider  ondansetron (ZOFRAN) 4 MG tablet Take 1 tablet (4 mg total) by mouth every 8 (eight) hours as needed for up to 10 doses for nausea or vomiting. 09/24/21  Yes Lucrezia Starch, MD  acetaminophen (TYLENOL) 500 MG tablet Take 1,000 mg by mouth every 6 (six) hours as needed for moderate pain or headache.    [provider]  amLODipine (NORVASC) 10 MG tablet TAKE 1 TABLET BY MOUTH ONCE DAILY 05/13/21   Birdie Sons, MD   Cholecalciferol (VITAMIN D) 50 MCG (2000 UT) CAPS Take 1 capsule by mouth daily.    [provider]  diclofenac Sodium (VOLTAREN) 1 % GEL Apply topically 4 (four) times daily.    [provider]  fluconazole (DIFLUCAN) 150 MG tablet TAKE 1 TABLET BY MOUTH ONCE DAILY (HOLD LOVASTATIN WHILE ON FLUCONAZOLE) Patient not taking: Reported on 07/28/2021 07/11/21   Birdie Sons, MD  fluconazole (DIFLUCAN) 150 MG tablet Take 1 tablet (150 mg total) by mouth daily. Hold lovastatin while on fluconazole Patient not taking: Reported on 07/28/2021 07/10/21   Birdie Sons, MD  lovastatin (MEVACOR) 40 MG tablet Take 1 tablet (40 mg total) by mouth at bedtime. Hold medication for the time being 07/28/21   Birdie Sons, MD  meclizine (ANTIVERT) 25 MG tablet Take 1 tablet (25 mg total) by mouth 3 (three) times daily as needed for dizziness. 04/30/20   Birdie Sons, MD  nystatin (MYCOSTATIN/NYSTOP) powder Apply 1 application topically 3 (  three) times daily. 06/24/20   Mar Daring, PA-C  promethazine (PHENERGAN) 25 MG tablet Take 1 tablet (25 mg total) by mouth every 8 (eight) hours as needed for nausea or vomiting. 04/30/20   Birdie Sons, MD  spironolactone (ALDACTONE) 50 MG tablet Take 1 tablet (50 mg total) by mouth daily. 05/06/21   Birdie Sons, MD  tiZANidine (ZANAFLEX) 2 MG tablet TAKE 1 OR 2 TABLETS BY MOUTH AT BEDTIME FOR LEG CRAMPS 05/30/21   Birdie Sons, MD  traMADol (ULTRAM) 50 MG tablet TAKE 1 TABLET BY MOUTH EVERY 6 HOURS AS NEEDED 12/13/20   Birdie Sons, MD  traZODone (DESYREL) 50 MG tablet Take 1-2 tablets (50-100 mg total) by mouth at bedtime as needed for sleep. 01/14/21   Birdie Sons, MD    Allergies Tegaderm ag mesh [silver], Amoxicillin, Chlorthalidone, and Sulfa antibiotics  Family History  Problem Relation Age of Onset   Gastric cancer Father    Brain cancer Father    Hypertension Sister    Heart disease Other    Ulcers Other     Epilepsy Other     Social History Social History   Tobacco Use   Smoking status: Never   Smokeless tobacco: Never  Vaping Use   Vaping Use: Never used  Substance Use Topics   Alcohol use: No   Drug use: No    Review of Systems  Review of Systems  Constitutional:  Positive for diaphoresis and malaise/fatigue. Negative for chills and fever.  HENT:  Negative for sore throat.   Eyes:  Negative for pain.  Respiratory:  Negative for cough and stridor.   Cardiovascular:  Negative for chest pain.  Gastrointestinal:  Positive for nausea. Negative for vomiting.  Genitourinary:  Negative for dysuria.  Musculoskeletal:  Positive for back pain (lower).  Skin:  Negative for rash.  Neurological:  Positive for dizziness, tremors and headaches. Negative for seizures and loss of consciousness.  Psychiatric/Behavioral:  Negative for suicidal ideas.   All other systems reviewed and are negative.    ____________________________________________   PHYSICAL EXAM:  VITAL SIGNS: ED Triage Vitals  Enc Vitals Group     BP 09/24/21 1026 (!) 137/59     Pulse Rate 09/24/21 1026 73     Resp 09/24/21 1026 16     Temp 09/24/21 1026 98.8 F (37.1 C)     Temp Source 09/24/21 1026 Oral     SpO2 09/24/21 1026 96 %     Weight 09/24/21 1027 173 lb (78.5 kg)     Height 09/24/21 1027 5\' 6"  (1.676 m)     Head Circumference --      Peak Flow --      Pain Score 09/24/21 1026 8     Pain Loc --      Pain Edu? --      Excl. in [place]? --    Vitals:   09/24/21 1026  BP: (!) 137/59  Pulse: 73  Resp: 16  Temp: 98.8 F (37.1 C)  SpO2: 96%   Physical Exam Vitals and nursing note reviewed.  Constitutional:      General: She is not in acute distress.    Appearance: She is well-developed.  HENT:     Head: Normocephalic and atraumatic.     Right Ear: External ear normal.     Left Ear: External ear normal.     Nose: Nose normal.  Eyes:     Conjunctiva/sclera: Conjunctivae normal.  Cardiovascular:  Rate and Rhythm: Normal rate and regular rhythm.     Heart sounds: No murmur heard. Pulmonary:     Effort: Pulmonary effort is normal. No respiratory distress.     Breath sounds: Normal breath sounds.  Abdominal:     Palpations: Abdomen is soft.     Tenderness: There is no abdominal tenderness.  Musculoskeletal:     Cervical back: Neck supple.  Skin:    General: Skin is warm and dry.  Neurological:     Mental Status: She is alert and oriented to person, place, and time.  Psychiatric:        Mood and Affect: Mood normal.     No tenderness step-offs or deformities over the C/T/L-spine.  There are some mild tenderness over the bilateral sacral soft tissues immediately lateral to midline.  There is no overlying skin changes or fluctuance.  2+ radial pulses.  Cranial nerves II through XII grossly intact.  No pronator drift.  No finger dysmetria.  Symmetric 5/5 strength of all extremities.  Sensation intact to light touch in all extremities.  Unremarkable unassisted gait.  ____________________________________________   LABS (all labs ordered are listed, but only abnormal results are displayed)  Labs Reviewed  BASIC METABOLIC PANEL - Abnormal; Notable for the following components:      Result Value   Glucose, Bld 121 (*)    Creatinine, Ser 1.25 (*)    GFR, Estimated 45 (*)    All other components within normal limits  CBC - Abnormal; Notable for the following components:   WBC 10.8 (*)    Hemoglobin 15.1 (*)    All other components within normal limits  URINALYSIS, ROUTINE W REFLEX MICROSCOPIC - Abnormal; Notable for the following components:   Color, Urine YELLOW (*)    APPearance CLEAR (*)    Ketones, ur 5 (*)    All other components within normal limits  RESP PANEL BY RT-PCR (FLU A&B, COVID) ARPGX2  HEPATIC FUNCTION PANEL   ____________________________________________  EKG  ECG shows sinus rhythm with a ventricular rate of 70, normal axis, unremarkable intervals  without clear evidence of acute ischemia or significant arrhythmia. ____________________________________________  RADIOLOGY  ED MD interpretation:   CT head has no evidence of skull fracture, intracranial hemorrhage, subacute CVA, edema or mass-effect but does show evidence of some progressive small vessel ischemic changes.  CT C-spine shows no evidence of acute traumatic fracture or traumatic subluxation.  There is some mild cervical spondylosis.  Chest x-ray is unremarkable for effusion, edema, pneumothorax, focal consolidation or any other acute thoracic process.  Plain film of the sacrum and coccyx there is no acute fracture or dislocation.  Official radiology report(s): DG Chest 2 View  Result Date: 09/24/2021 CLINICAL DATA:  Dizziness EXAM: CHEST - 2 VIEW COMPARISON:  None. FINDINGS: Heart size and mediastinal contours are within normal limits. No suspicious opacities identified. No pleural effusion or pneumothorax visualized. No acute osseous abnormality appreciated. IMPRESSION: No acute intrathoracic process identified. Electronically Signed   By: Ofilia Neas M.D.   On: 09/24/2021 15:28   DG Sacrum/Coccyx  Result Date: 09/24/2021 CLINICAL DATA:  Low back pain since falling last week. EXAM: SACRUM AND COCCYX - 2+ VIEW COMPARISON:  Lumbar spine radiographs 01/06/2019. FINDINGS: There is no evidence of acute fracture or diastasis of the sacroiliac joints or symphysis pubis. The hip joint spaces are preserved. There is lower lumbar spondylosis with disc space narrowing and facet hypertrophy. Mildly prominent stool in the rectum. IMPRESSION: No evidence of  acute sacrococcygeal injury. Electronically Signed   By: Richardean Sale M.D.   On: 09/24/2021 11:43   CT HEAD WO CONTRAST  Result Date: 09/24/2021 CLINICAL DATA:  Patient fell last week, striking the back of the head. Near syncopal episode today. Nausea and diaphoresis. Headache. EXAM: CT HEAD WITHOUT CONTRAST CT CERVICAL SPINE  WITHOUT CONTRAST TECHNIQUE: Multidetector CT imaging of the head and cervical spine was performed following the standard protocol without intravenous contrast. Multiplanar CT image reconstructions of the cervical spine were also generated. COMPARISON:  CT head and MRI head 09/17/2010. FINDINGS: CT HEAD FINDINGS Brain: There is no evidence of acute intracranial hemorrhage, mass lesion, brain edema or extra-axial fluid collection. The ventricles and subarachnoid spaces are appropriately sized for age. There is no CT evidence of acute cortical infarction. Mildly progressive patchy low-density in the periventricular and subcortical white matter, likely due to chronic small vessel ischemic changes. Vascular: Intracranial vascular calcifications. No hyperdense vessel identified. Skull: Negative for fracture or focal lesion. Sinuses/Orbits: The visualized paranasal sinuses and mastoid air cells are clear. No orbital abnormalities are seen. Other: None. CT CERVICAL SPINE FINDINGS Alignment: Straightening without focal angulation or listhesis. Skull base and vertebrae: No evidence of acute cervical spine fracture or traumatic subluxation. Soft tissues and spinal canal: No prevertebral fluid or swelling. No visible canal hematoma. Disc levels: Multilevel cervical spondylosis with disc space narrowing and uncinate spurring most advanced at C6-7. Mild left-sided foraminal narrowing at that level. No large disc herniation identified. Upper chest: Bilateral carotid atherosclerosis. The lung apices are clear. Other: None. IMPRESSION: 1. No acute intracranial or calvarial findings identified. 2. Patchy low-density in the periventricular and subcortical white matter has mildly progressed from remote priors, most consistent with chronic small vessel ischemic changes. 3. No evidence of acute cervical spine fracture, traumatic subluxation or static signs of instability. 4. Mild cervical spondylosis. Electronically Signed   By: Richardean Sale M.D.   On: 09/24/2021 11:30   CT Cervical Spine Wo Contrast  Result Date: 09/24/2021 CLINICAL DATA:  Patient fell last week, striking the back of the head. Near syncopal episode today. Nausea and diaphoresis. Headache. EXAM: CT HEAD WITHOUT CONTRAST CT CERVICAL SPINE WITHOUT CONTRAST TECHNIQUE: Multidetector CT imaging of the head and cervical spine was performed following the standard protocol without intravenous contrast. Multiplanar CT image reconstructions of the cervical spine were also generated. COMPARISON:  CT head and MRI head 09/17/2010. FINDINGS: CT HEAD FINDINGS Brain: There is no evidence of acute intracranial hemorrhage, mass lesion, brain edema or extra-axial fluid collection. The ventricles and subarachnoid spaces are appropriately sized for age. There is no CT evidence of acute cortical infarction. Mildly progressive patchy low-density in the periventricular and subcortical white matter, likely due to chronic small vessel ischemic changes. Vascular: Intracranial vascular calcifications. No hyperdense vessel identified. Skull: Negative for fracture or focal lesion. Sinuses/Orbits: The visualized paranasal sinuses and mastoid air cells are clear. No orbital abnormalities are seen. Other: None. CT CERVICAL SPINE FINDINGS Alignment: Straightening without focal angulation or listhesis. Skull base and vertebrae: No evidence of acute cervical spine fracture or traumatic subluxation. Soft tissues and spinal canal: No prevertebral fluid or swelling. No visible canal hematoma. Disc levels: Multilevel cervical spondylosis with disc space narrowing and uncinate spurring most advanced at C6-7. Mild left-sided foraminal narrowing at that level. No large disc herniation identified. Upper chest: Bilateral carotid atherosclerosis. The lung apices are clear. Other: None. IMPRESSION: 1. No acute intracranial or calvarial findings identified. 2. Patchy low-density in the  periventricular and subcortical  white matter has mildly progressed from remote priors, most consistent with chronic small vessel ischemic changes. 3. No evidence of acute cervical spine fracture, traumatic subluxation or static signs of instability. 4. Mild cervical spondylosis. Electronically Signed   By: Richardean Sale M.D.   On: 09/24/2021 11:30    ____________________________________________   PROCEDURES  Procedure(s) performed (including Critical Care):  Procedures   ____________________________________________   INITIAL IMPRESSION / ASSESSMENT AND PLAN / ED COURSE      Patient presents with above-stated history exam for assessment of several complaints seemingly all worse after a fall 2 weeks ago.  At that time patient states she hit her head and her lower back and was not evaluated subsequently.  She had some nausea before this which is in the same but she feels its not getting any better.  She also [place] is feeling some lightheadedness and a little trembling in her hands and diaphoresis intermittently.  On arrival she is afebrile and hemodynamically stable.  Differential includes possible ongoing symptoms related to recent concussion versus intracranial hemorrhage given headache.  No history or exam features to suggest acute or deep space infection in the head or neck.  Low suspicion for temporal arteritis.  With regard to lower back pain differential occludes possible coccygeal fracture versus contusion.  She is denying any urinary symptoms and has no fever or findings on UA to suggest pyelonephritis or cystitis.  She denies any abdominal pain vomiting or diarrhea and has no significant tenderness to suggest diverticulitis or other emergent intra-abdominal pathology.  BMP shows no significant electrolyte metabolic derangements.  Hepatic function panel has no evidence of hepatitis or cholestasis.  COVID influenza PCR is negative.  EKG shows no evidence of ischemia or arrhythmia.   CT head has no evidence of skull  fracture, intracranial hemorrhage, subacute CVA, edema or mass-effect but does show evidence of some progressive small vessel ischemic changes.  CT C-spine shows no evidence of acute traumatic fracture or traumatic subluxation.  There is some mild cervical spondylosis.  Chest x-ray is unremarkable for effusion, edema, pneumothorax, focal consolidation or any other acute thoracic process.  Plain film of the sacrum and coccyx there is no acute fracture or dislocation.  Overall unclear etiology for patient's ongoing symptoms are certainly possible this is related to concussion and head injury.  She is not significantly orthostatic on assessment.  Unclear etiology of her nausea that preceded this although given stable vitals with eyes reassuring exam and work-up with patient tolerating p.o. and low suspicion for immediate life-threatening process I think she is stable for discharge with close outpatient PCP follow-up and continued evaluation.  Recommended using lidocaine patch for lower back and continuing Tylenol.  Also prescribe short course of Zofran to ensure she is getting her nausea under control.  Discharged stable condition.  Strict return precautions advised and discussed.  Prior to discharge patient was observed to ambulate under her own power with steady gait.   ____________________________________________   FINAL CLINICAL IMPRESSION(S) / ED DIAGNOSES  Final diagnoses:  Lightheaded  Nausea  Contusion of lower back, initial encounter  Injury of head, initial encounter    Medications  lidocaine (LIDODERM) 5 % 1 patch (has no administration in time range)     ED Discharge Orders          Ordered    ondansetron (ZOFRAN) 4 MG tablet  Every 8 hours PRN        09/24/21 1551  Note:  This document was prepared using Dragon voice recognition software and may include unintentional dictation errors.    Lucrezia Starch, MD 09/24/21 [phone]

## 2021-09-24 NOTE — ED Triage Notes (Signed)
Pt arrived via ems from home for fall 2 weeks ago and hit head on cement. Pt reports vertigo and double vision over last, tail bone pain and nausea. Ems reports no blood thinners. A&o x 4  Vitals  BP 144/74 99% 87 HR  126 cbg Temp 99.1

## 2021-09-30 ENCOUNTER — Encounter: Payer: Self-pay | Admitting: Family Medicine

## 2021-09-30 ENCOUNTER — Other Ambulatory Visit: Payer: Self-pay

## 2021-09-30 ENCOUNTER — Ambulatory Visit (INDEPENDENT_AMBULATORY_CARE_PROVIDER_SITE_OTHER): Payer: Medicare Other | Admitting: Family Medicine

## 2021-09-30 VITALS — BP 131/63 | HR 64 | Temp 98.4°F | Wt 177.0 lb

## 2021-09-30 DIAGNOSIS — G8929 Other chronic pain: Secondary | ICD-10-CM | POA: Diagnosis not present

## 2021-09-30 DIAGNOSIS — F419 Anxiety disorder, unspecified: Secondary | ICD-10-CM | POA: Diagnosis not present

## 2021-09-30 DIAGNOSIS — M545 Low back pain, unspecified: Secondary | ICD-10-CM

## 2021-09-30 MED ORDER — VENLAFAXINE HCL ER 37.5 MG PO CP24: 37.5000 mg | ORAL_CAPSULE | Freq: Every day | ORAL | 0 refills | Status: DC

## 2021-09-30 MED ORDER — TRAMADOL HCL 50 MG PO TABS: 50.0000 mg | ORAL_TABLET | Freq: Four times a day (QID) | ORAL | 3 refills | Status: DC | PRN

## 2021-09-30 NOTE — Patient Instructions (Signed)
.   Please review the attached list of medications and notify my office if there are any errors.   . Please bring all of your medications to every appointment so we can make sure that our medication list is the same as yours.   

## 2021-09-30 NOTE — Progress Notes (Signed)
Established patient visit   Patient: Lindsay Russo   DOB: [birth year]   75 y.o. Female  MRN: [id number] Visit Date: 09/30/2021  Today's healthcare provider: Lelon Huh, MD   No chief complaint on file.  Subjective    Anxiety Presents for initial visit. The problem has been gradually worsening. Symptoms include excessive worry, insomnia, nervous/anxious behavior and restlessness. Patient reports no decreased concentration, depressed mood, dizziness, panic or suicidal ideas. Nighttime awakenings: one to two.     Follow up ER visit  Patient was seen in ER for near syncope, nausea, back pain, headache since a fall two week prior on 09/24/2021. She was treated for:   Final diagnoses:  Lightheaded  Nausea  Contusion of lower back, initial encounter  Injury of head, initial encounter   Treatment for this included:   Recommended using lidocaine patch for lower back and continuing Tylenol.  Also prescribe short course of Zofran to ensure she is getting her nausea under control.     She reports excellent compliance with treatment. She reports this condition is Improved.  Pt is still having issues with lower back pain.   -----------------------------------------------------------------------------------------     Medications: Outpatient Medications Prior to Visit  Medication Sig   acetaminophen (TYLENOL) 500 MG tablet Take 1,000 mg by mouth every 6 (six) hours as needed for moderate pain or headache.   amLODipine (NORVASC) 10 MG tablet TAKE 1 TABLET BY MOUTH ONCE DAILY   Cholecalciferol (VITAMIN D) 50 MCG (2000 UT) CAPS Take 1 capsule by mouth daily.   diclofenac Sodium (VOLTAREN) 1 % GEL Apply topically 4 (four) times daily.   fluconazole (DIFLUCAN) 150 MG tablet TAKE 1 TABLET BY MOUTH ONCE DAILY (HOLD LOVASTATIN WHILE ON FLUCONAZOLE) (Patient not taking: Reported on 07/28/2021)   fluconazole (DIFLUCAN) 150 MG tablet Take 1 tablet (150 mg total) by mouth daily. Hold  lovastatin while on fluconazole (Patient not taking: Reported on 07/28/2021)   lovastatin (MEVACOR) 40 MG tablet Take 1 tablet (40 mg total) by mouth at bedtime. Hold medication for the time being   meclizine (ANTIVERT) 25 MG tablet Take 1 tablet (25 mg total) by mouth 3 (three) times daily as needed for dizziness.   nystatin (MYCOSTATIN/NYSTOP) powder Apply 1 application topically 3 (three) times daily.   ondansetron (ZOFRAN) 4 MG tablet Take 1 tablet (4 mg total) by mouth every 8 (eight) hours as needed for up to 10 doses for nausea or vomiting.   promethazine (PHENERGAN) 25 MG tablet Take 1 tablet (25 mg total) by mouth every 8 (eight) hours as needed for nausea or vomiting.   spironolactone (ALDACTONE) 50 MG tablet Take 1 tablet (50 mg total) by mouth daily.   tiZANidine (ZANAFLEX) 2 MG tablet TAKE 1 OR 2 TABLETS BY MOUTH AT BEDTIME FOR LEG CRAMPS   traMADol (ULTRAM) 50 MG tablet TAKE 1 TABLET BY MOUTH EVERY 6 HOURS AS NEEDED   traZODone (DESYREL) 50 MG tablet Take 1-2 tablets (50-100 mg total) by mouth at bedtime as needed for sleep.   No facility-administered medications prior to visit.    Review of Systems  Constitutional: Negative.   Gastrointestinal: Negative.   Musculoskeletal:  Positive for back pain. Negative for arthralgias, gait problem, joint swelling, myalgias, neck pain and neck stiffness.  Neurological:  Negative for dizziness, light-headedness and headaches.  Psychiatric/Behavioral:  Negative for decreased concentration, dysphoric mood, self-injury, sleep disturbance and suicidal ideas. The patient is nervous/anxious and has insomnia.  Objective    BP 131/63 (BP Location: Right Arm, Patient Position: Sitting, Cuff Size: Large)   Pulse 64   Temp 98.4 F (36.9 C) (Oral)   Wt 177 lb (80.3 kg)   SpO2 98%   BMI 28.57 kg/m    Physical Exam  General appearance:  Well developed, well nourished female, cooperative and in no acute distress Head: Normocephalic,  without obvious abnormality, atraumatic Respiratory: Respirations even and unlabored, normal respiratory rate Extremities: All extremities are intact.  Skin: Skin color, texture, turgor normal. No rashes seen  Psych: Appropriate mood and affect. Neurologic: Mental status: Alert, oriented to person, place, and time, thought content appropriate.     Assessment & Plan     1. Anxiety Start venlafaxine XR (EFFEXOR XR) 37.5 MG 24 hr capsule; Take 1 capsule (37.5 mg total) by mouth daily with breakfast.  Dispense: 30 capsule; Refill: 0  Will contact her in a few few weeks to see how this is working and whether a dose increase is needed.  2. Chronic midline low back pain without sciatica A little worse since fall, will refill traMADol (ULTRAM) 50 MG tablet; Take 1 tablet (50 mg total) by mouth every 6 (six) hours as needed.  Dispense: 30 tablet; Refill: 3      The entirety of the information documented in the History of Present Illness, Review of Systems and Physical Exam were personally obtained by me. Portions of this information were initially documented by the CMA and reviewed by me for thoroughness and accuracy.     Lelon Huh, MD  [facility] [phone] (phone) [phone] (fax)  [place]

## 2021-10-21 ENCOUNTER — Telehealth: Payer: Self-pay | Admitting: Family Medicine

## 2021-10-21 DIAGNOSIS — F419 Anxiety disorder, unspecified: Secondary | ICD-10-CM

## 2021-10-21 NOTE — Telephone Encounter (Signed)
Please check with patient to see how she is doing with venlafaxine prescribed for anxiety on 09-30-2021. Se can refill the 37.5, or send in prescription for 75mg  capsules if she feels she needs the higher dose.

## 2021-10-22 MED ORDER — VENLAFAXINE HCL ER 75 MG PO CP24: 75.0000 mg | ORAL_CAPSULE | Freq: Every day | ORAL | 0 refills | Status: DC

## 2021-10-22 NOTE — Telephone Encounter (Signed)
Patient reports no change in symptoms with 37.5 mg, patient would like to try the 75mg  dose.

## 2021-11-05 ENCOUNTER — Encounter: Payer: Self-pay | Admitting: Family Medicine

## 2021-11-05 ENCOUNTER — Other Ambulatory Visit: Payer: Self-pay

## 2021-11-05 ENCOUNTER — Ambulatory Visit (INDEPENDENT_AMBULATORY_CARE_PROVIDER_SITE_OTHER): Payer: Medicare Other | Admitting: Family Medicine

## 2021-11-05 VITALS — BP 127/66 | HR 74 | Temp 98.6°F | Resp 16 | Wt 175.0 lb

## 2021-11-05 DIAGNOSIS — R7303 Prediabetes: Secondary | ICD-10-CM

## 2021-11-05 DIAGNOSIS — Z289 Immunization not carried out for unspecified reason: Secondary | ICD-10-CM | POA: Diagnosis not present

## 2021-11-05 DIAGNOSIS — E559 Vitamin D deficiency, unspecified: Secondary | ICD-10-CM | POA: Diagnosis not present

## 2021-11-05 DIAGNOSIS — F419 Anxiety disorder, unspecified: Secondary | ICD-10-CM | POA: Diagnosis not present

## 2021-11-05 MED ORDER — VENLAFAXINE HCL ER 75 MG PO CP24: 75.0000 mg | ORAL_CAPSULE | Freq: Every day | ORAL | 2 refills | Status: DC

## 2021-11-05 MED ORDER — SHINGRIX 50 MCG/0.5ML IM SUSR: 0.5000 mL | Freq: Once | INTRAMUSCULAR | 0 refills | Status: AC

## 2021-11-05 NOTE — Progress Notes (Signed)
Established patient visit   Patient: Lindsay Russo   DOB: [birth year]   75 y.o. Female  MRN: [id number] Visit Date: 11/05/2021  Today's healthcare provider: Lelon Huh, MD   Chief Complaint  Patient presents with   Anxiety   Subjective    HPI  Anxiety, Follow-up  She was last seen for anxiety 4 weeks ago. Changes made at last visit include starting Venlafaxine 37.5mg  daily. Dosage was later increased to 75mg  daily.   She reports fair compliance with treatment. Patient has not yet increased to 75mg  daily. She reports good tolerance of treatment. She is not having side effects.   She feels her anxiety is moderate and  slightly improved  since last visit.  Symptoms: No chest pain No difficulty concentrating  No dizziness No fatigue  No feelings of losing control No insomnia  No irritable No palpitations  No panic attacks No racing thoughts  No shortness of breath No sweating  No tremors/shakes    GAD-7 Results GAD-7 Generalized Anxiety Disorder Screening Tool 09/30/2021  1. Feeling Nervous, Anxious, or on Edge 1  2. Not Being Able to Stop or Control Worrying 2  3. Worrying Too Much About Different Things 3  4. Trouble Relaxing 1  5. Being So Restless it's Hard To Sit Still 0  6. Becoming Easily Annoyed or Irritable 0  7. Feeling Afraid As If Something Awful Might Happen 0  Total GAD-7 Score 7  Difficulty At Work, Home, or Getting  Along With Others? Not difficult at all    PHQ-9 Scores PHQ9 SCORE ONLY 11/05/2021 09/30/2021 05/05/2021  PHQ-9 Total Score 1 2 1     ---------------------------------------------------------------------------------------------------     Medications: Outpatient Medications Prior to Visit  Medication Sig   acetaminophen (TYLENOL) 500 MG tablet Take 1,000 mg by mouth every 6 (six) hours as needed for moderate pain or headache.   amLODipine (NORVASC) 10 MG tablet TAKE 1 TABLET BY MOUTH ONCE DAILY   Cholecalciferol (VITAMIN D) 50  MCG (2000 UT) CAPS Take 1 capsule by mouth daily.   diclofenac Sodium (VOLTAREN) 1 % GEL Apply topically 4 (four) times daily.   lovastatin (MEVACOR) 40 MG tablet Take 1 tablet (40 mg total) by mouth at bedtime. Hold medication for the time being   meclizine (ANTIVERT) 25 MG tablet Take 1 tablet (25 mg total) by mouth 3 (three) times daily as needed for dizziness.   nystatin (MYCOSTATIN/NYSTOP) powder Apply 1 application topically 3 (three) times daily.   ondansetron (ZOFRAN) 4 MG tablet Take 1 tablet (4 mg total) by mouth every 8 (eight) hours as needed for up to 10 doses for nausea or vomiting.   promethazine (PHENERGAN) 25 MG tablet Take 1 tablet (25 mg total) by mouth every 8 (eight) hours as needed for nausea or vomiting.   spironolactone (ALDACTONE) 50 MG tablet Take 1 tablet (50 mg total) by mouth daily.   tiZANidine (ZANAFLEX) 2 MG tablet TAKE 1 OR 2 TABLETS BY MOUTH AT BEDTIME FOR LEG CRAMPS   traMADol (ULTRAM) 50 MG tablet Take 1 tablet (50 mg total) by mouth every 6 (six) hours as needed.   traZODone (DESYREL) 50 MG tablet Take 1-2 tablets (50-100 mg total) by mouth at bedtime as needed for sleep.   venlafaxine XR (EFFEXOR XR) 37.5 MG 24 hr capsule Take 1 capsule (37.5 mg total) by mouth daily with breakfast.   [DISCONTINUED] fluconazole (DIFLUCAN) 150 MG tablet TAKE 1 TABLET BY MOUTH ONCE DAILY (HOLD LOVASTATIN WHILE  ON FLUCONAZOLE) (Patient not taking: Reported on 11/05/2021)   [DISCONTINUED] fluconazole (DIFLUCAN) 150 MG tablet Take 1 tablet (150 mg total) by mouth daily. Hold lovastatin while on fluconazole (Patient not taking: Reported on 11/05/2021)   No facility-administered medications prior to visit.    Review of Systems  Constitutional:  Negative for appetite change, chills, fatigue and fever.  Respiratory:  Negative for chest tightness and shortness of breath.   Cardiovascular:  Negative for chest pain and palpitations.  Gastrointestinal:  Negative for abdominal pain, nausea  and vomiting.  Neurological:  Negative for dizziness and weakness.      Objective    BP 127/66 (BP Location: Right Arm, Patient Position: Sitting, Cuff Size: Normal)   Pulse 74   Temp 98.6 F (37 C) (Oral)   Resp 16   Wt 175 lb (79.4 kg)   SpO2 98% Comment: room air  BMI 28.25 kg/m  {Show previous vital signs (optional):23777}  Physical Exam  General appearance:  Well developed, well nourished female, cooperative and in no acute distress Head: Normocephalic, without obvious abnormality, atraumatic Respiratory: Respirations even and unlabored, normal respiratory rate Extremities: All extremities are intact.  Skin: Skin color, texture, turgor normal. No rashes seen  Psych: Appropriate mood and affect. Neurologic: Mental status: Alert, oriented to person, place, and time, thought content appropriate.     Assessment & Plan     1. Anxiety Tolerating initiation of venlafaxine with some improvement in anxiety. Increase from 37.5 to - venlafaxine XR (EFFEXOR XR) 75 MG 24 hr capsule; Take 1 capsule (75 mg total) by mouth daily with breakfast.  Dispense: 30 capsule; Refill: 2  2. Pre-diabetes  - Hemoglobin A1c  3. Vitamin D deficiency  - VITAMIN D 25 Hydroxy (Vit-D Deficiency, Fractures)  4. Prescription for Shingrix. Vaccine not administered in office.   - Zoster Vaccine Adjuvanted [facility]) injection; Inject 0.5 mLs into the muscle once for 1 dose. Repeat after 2 months  Dispense: 0.5 mL; Refill: 0        The entirety of the information documented in the History of Present Illness, Review of Systems and Physical Exam were personally obtained by me. Portions of this information were initially documented by the CMA and reviewed by me for thoroughness and accuracy.     Lelon Huh, MD  [facility] [phone] (phone) [phone] (fax)  [place]

## 2021-11-06 LAB — HEMOGLOBIN A1C
Est. average glucose Bld gHb Est-mCnc: 120 mg/dL
Hgb A1c MFr Bld: 5.8 % — ABNORMAL HIGH (ref 4.8–5.6)

## 2021-11-06 LAB — VITAMIN D 25 HYDROXY (VIT D DEFICIENCY, FRACTURES): Vit D, 25-Hydroxy: 28.8 ng/mL — ABNORMAL LOW (ref 30.0–100.0)

## 2022-01-05 ENCOUNTER — Other Ambulatory Visit: Payer: Self-pay | Admitting: Family Medicine

## 2022-02-03 ENCOUNTER — Ambulatory Visit: Payer: Medicare Other | Admitting: Family Medicine

## 2022-02-06 ENCOUNTER — Other Ambulatory Visit: Payer: Self-pay | Admitting: Family Medicine

## 2022-02-06 DIAGNOSIS — F419 Anxiety disorder, unspecified: Secondary | ICD-10-CM

## 2022-02-13 ENCOUNTER — Encounter: Payer: Self-pay | Admitting: Family Medicine

## 2022-02-13 ENCOUNTER — Ambulatory Visit (INDEPENDENT_AMBULATORY_CARE_PROVIDER_SITE_OTHER): Payer: Medicare Other | Admitting: Family Medicine

## 2022-02-13 ENCOUNTER — Other Ambulatory Visit: Payer: Self-pay

## 2022-02-13 VITALS — BP 130/72 | HR 74 | Temp 98.4°F | Resp 16 | Wt 178.0 lb

## 2022-02-13 DIAGNOSIS — R7303 Prediabetes: Secondary | ICD-10-CM | POA: Diagnosis not present

## 2022-02-13 DIAGNOSIS — F419 Anxiety disorder, unspecified: Secondary | ICD-10-CM | POA: Diagnosis not present

## 2022-02-13 DIAGNOSIS — E559 Vitamin D deficiency, unspecified: Secondary | ICD-10-CM

## 2022-02-13 LAB — POCT GLYCOSYLATED HEMOGLOBIN (HGB A1C)
Est. average glucose Bld gHb Est-mCnc: 108
Hemoglobin A1C: 5.4 % (ref 4.0–5.6)

## 2022-02-13 NOTE — Patient Instructions (Signed)
.   Please review the attached list of medications and notify my office if there are any errors.   . Please bring all of your medications to every appointment so we can make sure that our medication list is the same as yours.   

## 2022-02-13 NOTE — Progress Notes (Signed)
?  ? ? ?Established patient visit ? ?I,[name],acting as a scribe for Mila Merry, MD.,have documented all relevant documentation on the behalf of Mila Merry, MD,as directed by  Mila Merry, MD while in the presence of Mila Merry, MD. ? ? ?Patient: Lindsay Russo   DOB: [birth year]   76 y.o. Female  MRN: [id number] ?Visit Date: 02/13/2022 ? ?Today's healthcare provider: Mila Merry, MD  ? ?Chief Complaint  ?Patient presents with  ? Follow-up  ? Prediabetes  ? Anxiety  ? ?Subjective  ?  ?HPI  ?Prediabetes, Follow-up ? ?Lab Results  ?Component Value Date  ? HGBA1C 5.4 02/13/2022  ? HGBA1C 5.8 (H) 11/05/2021  ? HGBA1C 5.8 (H) 01/15/2021  ? GLUCOSE 121 (H) 09/24/2021  ? GLUCOSE 117 (H) 05/05/2021  ? GLUCOSE 104 (H) 03/24/2021  ? ? ?Last seen for for this3 months ago.  ?Management since that visit includes; labs checked showing-good. ?Current symptoms include none and have been unchanged. ? ?Prior visit with dietician: no ?Current diet: well balanced ?Current exercise:  gym and walking ? ?Pertinent Labs: ?   ?Component Value Date/Time  ? CHOL 179 01/15/2021 1318  ? TRIG 133 01/15/2021 1318  ? CHOLHDL 3.6 01/15/2021 1318  ? CREATININE 1.25 (H) 09/24/2021 1031  ? CREATININE 1.12 05/04/2013 2326  ? ? ?Wt Readings from Last 3 Encounters:  ?02/13/22 178 lb (80.7 kg)  ?11/05/21 175 lb (79.4 kg)  ?09/30/21 177 lb (80.3 kg)  ? ? ?----------------------------------------------------------------------------------------- ?Anxiety, Follow-up ? ?She was last seen for anxiety 3 months ago. ?Changes made at last visit include; Tolerating initiation of venlafaxine with some improvement in anxiety. ?  ?She reports good compliance with treatment. ?She reports good tolerance of treatment. ?She is not having side effects. none ? ?She feels her anxiety is mild and Improved since last visit. ? ?Symptoms: ?No chest pain No difficulty concentrating  ?No dizziness No fatigue  ?No feelings of losing control No insomnia  ?No  irritable No palpitations  ?No panic attacks No racing thoughts  ?No shortness of breath No sweating  ?No tremors/shakes   ? ?GAD-7 Results ?GAD-7 Generalized Anxiety Disorder Screening Tool 02/13/2022 09/30/2021  ?1. Feeling Nervous, Anxious, or on Edge 0 1  ?2. Not Being Able to Stop or Control Worrying 0 2  ?3. Worrying Too Much About Different Things 0 3  ?4. Trouble Relaxing 0 1  ?5. Being So Restless it's Hard To Sit Still 0 0  ?6. Becoming Easily Annoyed or Irritable 0 0  ?7. Feeling Afraid As If Something Awful Might Happen 0 0  ?Total GAD-7 Score 0 7  ?Difficulty At Work, Home, or Getting  Along With Others? Not difficult at all Not difficult at all  ? ? ?PHQ-9 Scores ?PHQ9 SCORE ONLY 02/13/2022 11/05/2021 09/30/2021  ?PHQ-9 Total Score 0 1 2  ? ? ?--------------------------------------------------------------------------------------------------- ? ? ?Medications: ?Outpatient Medications Prior to Visit  ?Medication Sig  ? acetaminophen (TYLENOL) 500 MG tablet Take 1,000 mg by mouth every 6 (six) hours as needed for moderate pain or headache.  ? amLODipine (NORVASC) 10 MG tablet TAKE 1 TABLET BY MOUTH ONCE DAILY  ? Cholecalciferol (VITAMIN D) 50 MCG (2000 UT) CAPS Take 1 capsule by mouth daily.  ? diclofenac Sodium (VOLTAREN) 1 % GEL Apply topically 4 (four) times daily.  ? lovastatin (MEVACOR) 40 MG tablet TAKE 1 TABLET BY MOUTH AT BEDTIME  ? meclizine (ANTIVERT) 25 MG tablet Take 1 tablet (25 mg total) by mouth 3 (three) times daily as  needed for dizziness.  ? nystatin (MYCOSTATIN/NYSTOP) powder Apply 1 application topically 3 (three) times daily.  ? ondansetron (ZOFRAN) 4 MG tablet Take 1 tablet (4 mg total) by mouth every 8 (eight) hours as needed for up to 10 doses for nausea or vomiting.  ? promethazine (PHENERGAN) 25 MG tablet Take 1 tablet (25 mg total) by mouth every 8 (eight) hours as needed for nausea or vomiting.  ? spironolactone (ALDACTONE) 50 MG tablet Take 1 tablet (50 mg total) by mouth daily.  ?  tiZANidine (ZANAFLEX) 2 MG tablet TAKE 1 OR 2 TABLETS BY MOUTH AT BEDTIME FOR LEG CRAMPS  ? traMADol (ULTRAM) 50 MG tablet Take 1 tablet (50 mg total) by mouth every 6 (six) hours as needed.  ? traZODone (DESYREL) 50 MG tablet Take 1-2 tablets (50-100 mg total) by mouth at bedtime as needed for sleep.  ? venlafaxine XR (EFFEXOR-XR) 75 MG 24 hr capsule TAKE 1 CAPSULE BY MOUTH ONCE DAILY WITH BREAKFAST  ? ?No facility-administered medications prior to visit.  ? ? ? ?  Objective  ?  ?BP 130/72 (BP Location: Right Arm, Patient Position: Sitting, Cuff Size: Large)   Pulse 74   Temp 98.4 ?F (36.9 ?C) (Temporal)   Resp 16   Wt 178 lb (80.7 kg)   SpO2 98%   BMI 28.73 kg/m?  ? ? ?Physical Exam  ?General appearance:  ?Well developed, well nourished female, cooperative and in no acute distress ?Head: Normocephalic, without obvious abnormality, atraumatic ?Respiratory: Respirations even and unlabored, normal respiratory rate ?Extremities: All extremities are intact.  ?Skin: Skin color, texture, turgor normal. No rashes seen  ?Psych: Appropriate mood and affect. ?Neurologic: Mental status: Alert, oriented to person, place, and time, thought content appropriate.  ? ?Results for orders placed or performed in visit on 02/13/22  ?POCT glycosylated hemoglobin (Hb A1C)  ?Result Value Ref Range  ? Hemoglobin A1C 5.4 4.0 - 5.6 %  ? Est. average glucose Bld gHb Est-mCnc 108   ? ? Assessment & Plan  ?  ? ?1. Pre-diabetes ?Doing very well with diet and staying active.  ? ?2. Vitamin D deficiency ?Taking vitamin d supplement consistently. Recheck levels next visit.  ? ?3. Anxiety ?Doing very well with venlafaxine. Continue current medications.   ? ?Future Appointments  ?Date Time Provider Department Center  ?05/22/2022 10:00 AM Saya Mccoll, Demetrios Isaacs, MD [facility]  ?   ?   ? ?The entirety of the information documented in the History of Present Illness, Review of Systems and Physical Exam were personally obtained by me. Portions of this  information were initially documented by the CMA and reviewed by me for thoroughness and accuracy.   ? ? ?Mila Merry, MD  ?[facility] ?[phone] (phone) ?[phone] (fax) ? ?[facility] ?

## 2022-04-09 DIAGNOSIS — M7981 Nontraumatic hematoma of soft tissue: Secondary | ICD-10-CM | POA: Diagnosis not present

## 2022-04-09 DIAGNOSIS — B372 Candidiasis of skin and nail: Secondary | ICD-10-CM | POA: Diagnosis not present

## 2022-04-09 DIAGNOSIS — L578 Other skin changes due to chronic exposure to nonionizing radiation: Secondary | ICD-10-CM | POA: Diagnosis not present

## 2022-04-09 DIAGNOSIS — Z872 Personal history of diseases of the skin and subcutaneous tissue: Secondary | ICD-10-CM | POA: Diagnosis not present

## 2022-04-09 DIAGNOSIS — D692 Other nonthrombocytopenic purpura: Secondary | ICD-10-CM | POA: Diagnosis not present

## 2022-05-12 ENCOUNTER — Encounter: Payer: Medicare Other | Admitting: Family Medicine

## 2022-05-14 ENCOUNTER — Other Ambulatory Visit: Payer: Self-pay | Admitting: Family Medicine

## 2022-05-14 DIAGNOSIS — I10 Essential (primary) hypertension: Secondary | ICD-10-CM

## 2022-05-21 NOTE — Progress Notes (Signed)
Complete Physical Exam   I,[name],acting as a scribe for Lindsay Huh, MD.,have documented all relevant documentation on the behalf of Lindsay Huh, MD,as directed by  Lindsay Huh, MD while in the presence of Lindsay Huh, MD.     Patient: Lindsay Russo, Female    DOB: 29-Jun-1946, 76 y.o.   MRN: [id number] Visit Date: 05/22/2022  Today's Provider: Lelon Huh, MD   Chief Complaint  Patient presents with   Lindsay Russo is a 76 y.o. female who presents today for her complete physical examination  She reports consuming a general diet. Home exercise routine includes walking and gym 2x a week. She generally feels well. She reports sleeping well. She does not have additional problems to discuss today.   HPI Hypertension, follow-up  BP Readings from Last 3 Encounters:  05/22/22 121/63  02/13/22 130/72  11/05/21 127/66   Wt Readings from Last 3 Encounters:  05/22/22 183 lb (83 kg)  02/13/22 178 lb (80.7 kg)  11/05/21 175 lb (79.4 kg)     She was last seen for hypertension 1  year  ago.  BP at that visit was 121/72. Management since that visit includes continue same medication.  She reports good compliance with treatment. She is not having side effects. none She is following a Regular, Low Sodium diet. She is exercising. She does not smoke.  Use of agents associated with hypertension: none.   Outside blood pressures are checks occasionally.  Pertinent labs Lab Results  Component Value Date   CHOL 179 01/15/2021   HDL 50 01/15/2021   LDLCALC 105 (H) 01/15/2021   TRIG 133 01/15/2021   CHOLHDL 3.6 01/15/2021   Lab Results  Component Value Date   NA 138 09/24/2021   K 4.2 09/24/2021   CREATININE 1.25 (H) 09/24/2021   GFRNONAA 45 (L) 09/24/2021   GLUCOSE 121 (H) 09/24/2021   TSH 2.540 01/15/2021     The 10-year ASCVD risk score (Arnett DK, et al., 2019) is:  20.8%  ---------------------------------------------------------------------------------------------------   Follow up for vitamin D deficiency:  The patient was last seen for this 3 months ago. Changes made at last visit include none; continue vitamin d supplement. Lab Results  Component Value Date   VD25OH 23.8 (L) 05/22/2022    She reports good compliance with treatment. She feels that condition is Unchanged. She is not having side effects. none  -----------------------------------------------------------------------------------------    Medications: Outpatient Medications Prior to Visit  Medication Sig   acetaminophen (TYLENOL) 500 MG tablet Take 1,000 mg by mouth every 6 (six) hours as needed for moderate pain or headache.   amLODipine (NORVASC) 10 MG tablet TAKE 1 TABLET BY MOUTH ONCE DAILY   Cholecalciferol (VITAMIN D) 50 MCG (2000 UT) CAPS Take 1 capsule by mouth daily.   diclofenac Sodium (VOLTAREN) 1 % GEL Apply topically 4 (four) times daily.   lovastatin (MEVACOR) 40 MG tablet TAKE 1 TABLET BY MOUTH AT BEDTIME   meclizine (ANTIVERT) 25 MG tablet Take 1 tablet (25 mg total) by mouth 3 (three) times daily as needed for dizziness.   nystatin (MYCOSTATIN/NYSTOP) powder Apply 1 application topically 3 (three) times daily.   ondansetron (ZOFRAN) 4 MG tablet Take 1 tablet (4 mg total) by mouth every 8 (eight) hours as needed for up to 10 doses for nausea or vomiting.   promethazine (PHENERGAN) 25 MG tablet Take 1 tablet (25 mg total) by mouth every 8 (eight) hours  as needed for nausea or vomiting.   spironolactone (ALDACTONE) 50 MG tablet TAKE 1 TABLET BY MOUTH ONCE DAILY   tiZANidine (ZANAFLEX) 2 MG tablet TAKE 1 OR 2 TABLETS BY MOUTH AT BEDTIME FOR LEG CRAMPS   traMADol (ULTRAM) 50 MG tablet Take 1 tablet (50 mg total) by mouth every 6 (six) hours as needed.   traZODone (DESYREL) 50 MG tablet Take 1-2 tablets (50-100 mg total) by mouth at bedtime as needed for sleep.    venlafaxine XR (EFFEXOR-XR) 75 MG 24 hr capsule TAKE 1 CAPSULE BY MOUTH ONCE DAILY WITH BREAKFAST   No facility-administered medications prior to visit.    Allergies  Allergen Reactions   Tegaderm Ag Mesh [Silver] Other (See Comments)    blisters   Amoxicillin Hives and Rash    Has patient had a PCN reaction causing immediate rash, facial/tongue/throat swelling, SOB or lightheadedness with hypotension: Yes Has patient had a PCN reaction causing severe rash involving mucus membranes or skin necrosis: Yes Has patient had a PCN reaction that required hospitalization No Has patient had a PCN reaction occurring within the last 10 years: Yes If all of the above answers are "NO", then may proceed with Cephalosporin use.    Chlorthalidone Rash   Sulfa Antibiotics Rash    Patient Care Team: Birdie Sons, MD as PCP - General (Family Medicine) Bary Castilla, Forest Gleason, MD (General Surgery) Anell Barr, OD as Consulting Physician (Optometry) Marry Guan, Laurice Record, MD as Consulting Physician (Orthopedic Surgery) Ree Edman, MD (Dermatology)  Review of Systems  HENT:  Positive for sneezing.   Endocrine: Positive for polyuria.  Musculoskeletal:  Positive for back pain and myalgias.  Hematological:  Bruises/bleeds easily.        Objective    Vitals: BP 121/63 (BP Location: Right Arm, Patient Position: Sitting, Cuff Size: Normal)   Pulse 66   Resp 16   Ht '5\' 6"'$  (1.676 m)   Wt 183 lb (83 kg)   SpO2 99%   BMI 29.54 kg/m     Physical Exam  General Appearance:    Well developed, well nourished female. Alert, cooperative, in no acute distress, appears stated age   Head:    Normocephalic, without obvious abnormality, atraumatic  Eyes:    PERRL, conjunctiva/corneas clear, EOM's intact, fundi    benign, both eyes  Ears:    Normal TM's and external ear canals, both ears  Nose:   Nares normal, septum midline, mucosa normal, no drainage    or sinus tenderness  Throat:   Lips,  mucosa, and tongue normal; teeth and gums normal  Neck:   Supple, symmetrical, trachea midline, no adenopathy;    thyroid:  no enlargement/tenderness/nodules; no carotid   bruit or JVD  Back:     Symmetric, no curvature, ROM normal, no CVA tenderness  Lungs:     Clear to auscultation bilaterally, respirations unlabored  Chest Wall:    No tenderness or deformity   Heart:    Normal heart rate. Normal rhythm. No murmurs, rubs, or gallops.   Breast Exam:    deferred  Abdomen:     Soft, non-tender, bowel sounds active all four quadrants,    no masses, no organomegaly  Pelvic:    deferred  Extremities:   All extremities are intact. No cyanosis or edema  Pulses:   2+ and symmetric all extremities  Skin:   Skin color, texture, turgor normal, no rashes or lesions  Lymph nodes:   Cervical, supraclavicular, and axillary nodes  normal  Neurologic:   CNII-XII intact, normal strength, sensation and reflexes    throughout    Assessment & Plan     1. Annual physical exam   2. Hyperlipidemia, mixed She is tolerating lovastatin well with no adverse effects.   - CBC - Comprehensive metabolic panel - Lipid panel  3. Primary hypertension Well controlled.   Chief Complaint  Patient presents with   [facility]  ,  - TSH  4. Pre-diabetes  - Hemoglobin A1c  5. History of CVA (cerebrovascular accident) Asymptomatic. Compliant with medication.  Continue aggressive risk factor modification.    6. Stage 3 chronic kidney disease, unspecified whether stage 3a or 3b CKD (HCC)  - VITAMIN D 25 Hydroxy (Vit-D Deficiency, Fractures)  7. Leg cramps  - Magnesium     The entirety of the information documented in the History of Present Illness, Review of Systems and Physical Exam were personally obtained by me. Portions of this information were initially documented by the CMA and reviewed by me for thoroughness and accuracy.     Lindsay Huh, MD  [facility] [phone]  (phone) [phone] (fax)  [place]

## 2022-05-22 ENCOUNTER — Ambulatory Visit (INDEPENDENT_AMBULATORY_CARE_PROVIDER_SITE_OTHER): Payer: Medicare Other | Admitting: Family Medicine

## 2022-05-22 ENCOUNTER — Encounter: Payer: Self-pay | Admitting: Family Medicine

## 2022-05-22 VITALS — BP 121/63 | HR 66 | Resp 16 | Ht 66.0 in | Wt 183.0 lb

## 2022-05-22 DIAGNOSIS — Z0000 Encounter for general adult medical examination without abnormal findings: Secondary | ICD-10-CM | POA: Diagnosis not present

## 2022-05-22 DIAGNOSIS — E782 Mixed hyperlipidemia: Secondary | ICD-10-CM

## 2022-05-22 DIAGNOSIS — Z8673 Personal history of transient ischemic attack (TIA), and cerebral infarction without residual deficits: Secondary | ICD-10-CM

## 2022-05-22 DIAGNOSIS — N183 Chronic kidney disease, stage 3 unspecified: Secondary | ICD-10-CM

## 2022-05-22 DIAGNOSIS — R252 Cramp and spasm: Secondary | ICD-10-CM

## 2022-05-22 DIAGNOSIS — R7303 Prediabetes: Secondary | ICD-10-CM

## 2022-05-22 DIAGNOSIS — I10 Essential (primary) hypertension: Secondary | ICD-10-CM | POA: Diagnosis not present

## 2022-05-22 DIAGNOSIS — E559 Vitamin D deficiency, unspecified: Secondary | ICD-10-CM | POA: Diagnosis not present

## 2022-05-23 LAB — LIPID PANEL
Chol/HDL Ratio: 3.2 ratio (ref 0.0–4.4)
Cholesterol, Total: 168 mg/dL (ref 100–199)
HDL: 53 mg/dL (ref >39)
LDL Chol Calc (NIH): 95 mg/dL (ref 0–99)
Triglycerides: 109 mg/dL (ref 0–149)
VLDL Cholesterol Cal: 20 mg/dL (ref 5–40)

## 2022-05-23 LAB — COMPREHENSIVE METABOLIC PANEL
ALT: 11 IU/L (ref 0–32)
AST: 15 IU/L (ref 0–40)
Albumin/Globulin Ratio: 1.8 (ref 1.2–2.2)
Albumin: 4.4 g/dL (ref 3.7–4.7)
Alkaline Phosphatase: 98 IU/L (ref 44–121)
BUN/Creatinine Ratio: 24 (ref 12–28)
BUN: 25 mg/dL (ref 8–27)
Bilirubin Total: 0.3 mg/dL (ref 0.0–1.2)
CO2: 24 mmol/L (ref 20–29)
Calcium: 9.9 mg/dL (ref 8.7–10.3)
Chloride: 106 mmol/L (ref 96–106)
Creatinine, Ser: 1.06 mg/dL — ABNORMAL HIGH (ref 0.57–1.00)
Globulin, Total: 2.5 g/dL (ref 1.5–4.5)
Glucose: 99 mg/dL (ref 70–99)
Potassium: 5 mmol/L (ref 3.5–5.2)
Sodium: 144 mmol/L (ref 134–144)
Total Protein: 6.9 g/dL (ref 6.0–8.5)
eGFR: 54 mL/min/{1.73_m2} — ABNORMAL LOW (ref >59)

## 2022-05-23 LAB — HEMOGLOBIN A1C
Est. average glucose Bld gHb Est-mCnc: 117 mg/dL
Hgb A1c MFr Bld: 5.7 % — ABNORMAL HIGH (ref 4.8–5.6)

## 2022-05-23 LAB — TSH: TSH: 2.9 u[IU]/mL (ref 0.450–4.500)

## 2022-05-23 LAB — CBC
Hematocrit: 44 % (ref 34.0–46.6)
Hemoglobin: 14.4 g/dL (ref 11.1–15.9)
MCH: 29.6 pg (ref 26.6–33.0)
MCHC: 32.7 g/dL (ref 31.5–35.7)
MCV: 90 fL (ref 79–97)
Platelets: 231 10*3/uL (ref 150–450)
RBC: 4.87 x10E6/uL (ref 3.77–5.28)
RDW: 12.1 % (ref 11.7–15.4)
WBC: 10.1 10*3/uL (ref 3.4–10.8)

## 2022-05-23 LAB — VITAMIN D 25 HYDROXY (VIT D DEFICIENCY, FRACTURES): Vit D, 25-Hydroxy: 23.8 ng/mL — ABNORMAL LOW (ref 30.0–100.0)

## 2022-05-23 LAB — MAGNESIUM: Magnesium: 2.1 mg/dL (ref 1.6–2.3)

## 2022-05-27 ENCOUNTER — Encounter: Payer: Self-pay | Admitting: Family Medicine

## 2022-05-28 ENCOUNTER — Telehealth: Payer: Self-pay | Admitting: Physician Assistant

## 2022-06-15 ENCOUNTER — Other Ambulatory Visit: Payer: Self-pay | Admitting: Family Medicine

## 2022-06-15 DIAGNOSIS — I10 Essential (primary) hypertension: Secondary | ICD-10-CM

## 2022-06-16 NOTE — Progress Notes (Signed)
Annual Wellness Visit     Patient: Lindsay Russo, Female    DOB: 1946/08/23, 76 y.o.   MRN: [id number] Visit Date: 05/22/2022  Today's Provider: Lelon Huh, MD   Chief Complaint  Patient presents with   [employer] [place] is a 76 y.o. female who presents today for her Annual Wellness Visit.   Medications: Outpatient Medications Prior to Visit  Medication Sig   acetaminophen (TYLENOL) 500 MG tablet Take 1,000 mg by mouth every 6 (six) hours as needed for moderate pain or headache.   Cholecalciferol (VITAMIN D) 50 MCG (2000 UT) CAPS Take 1 capsule by mouth daily.   diclofenac Sodium (VOLTAREN) 1 % GEL Apply topically 4 (four) times daily.   lovastatin (MEVACOR) 40 MG tablet TAKE 1 TABLET BY MOUTH AT BEDTIME   meclizine (ANTIVERT) 25 MG tablet Take 1 tablet (25 mg total) by mouth 3 (three) times daily as needed for dizziness.   nystatin (MYCOSTATIN/NYSTOP) powder Apply 1 application topically 3 (three) times daily.   ondansetron (ZOFRAN) 4 MG tablet Take 1 tablet (4 mg total) by mouth every 8 (eight) hours as needed for up to 10 doses for nausea or vomiting.   promethazine (PHENERGAN) 25 MG tablet Take 1 tablet (25 mg total) by mouth every 8 (eight) hours as needed for nausea or vomiting.   spironolactone (ALDACTONE) 50 MG tablet TAKE 1 TABLET BY MOUTH ONCE DAILY   tiZANidine (ZANAFLEX) 2 MG tablet TAKE 1 OR 2 TABLETS BY MOUTH AT BEDTIME FOR LEG CRAMPS   traMADol (ULTRAM) 50 MG tablet Take 1 tablet (50 mg total) by mouth every 6 (six) hours as needed.   traZODone (DESYREL) 50 MG tablet Take 1-2 tablets (50-100 mg total) by mouth at bedtime as needed for sleep.   venlafaxine XR (EFFEXOR-XR) 75 MG 24 hr capsule TAKE 1 CAPSULE BY MOUTH ONCE DAILY WITH BREAKFAST   [DISCONTINUED] amLODipine (NORVASC) 10 MG tablet TAKE 1 TABLET BY MOUTH ONCE DAILY   No facility-administered medications prior to visit.    Allergies  Allergen Reactions   Tegaderm  Ag Mesh [Silver] Other (See Comments)    blisters   Amoxicillin Hives and Rash    Has patient had a PCN reaction causing immediate rash, facial/tongue/throat swelling, SOB or lightheadedness with hypotension: Yes Has patient had a PCN reaction causing severe rash involving mucus membranes or skin necrosis: Yes Has patient had a PCN reaction that required hospitalization No Has patient had a PCN reaction occurring within the last 10 years: Yes If all of the above answers are "NO", then may proceed with Cephalosporin use.    Chlorthalidone Rash   Sulfa Antibiotics Rash    Patient Care Team: Birdie Sons, MD as PCP - General (Family Medicine) Bary Castilla, Forest Gleason, MD (General Surgery) Anell Barr, OD as Consulting Physician (Optometry) Marry Guan, Laurice Record, MD as Consulting Physician (Orthopedic Surgery) Ree Edman, MD (Dermatology) Ree Edman, MD as Referring Physician (Dermatology)        Objective      Most recent functional status assessment:    05/22/2022    9:53 AM  In your present state of health, do you have any difficulty performing the following activities:  Hearing? 0  Vision? 0  Difficulty concentrating or making decisions? 0  Walking or climbing stairs? 0  Dressing or bathing? 0  Doing errands, shopping? 0   Most recent fall risk assessment:    05/22/2022  9:53 AM  Fall Risk   Falls in the past year? 1  Number falls in past yr: 0  Injury with Fall? 0  Risk for fall due to : No Fall Risks  Follow up Falls evaluation completed    Most recent depression screenings:    05/22/2022    9:53 AM 02/13/2022    8:29 AM  PHQ 2/9 Scores  PHQ - 2 Score 0 0  PHQ- 9 Score 1 0   Most recent cognitive screening:    04/30/2020    1:38 PM  6CIT Screen  What Year? 0 points  What month? 0 points  What time? 0 points  Count back from 20 0 points  Months in reverse 0 points  Repeat phrase 0 points  Total Score 0 points   Most recent Audit-C  alcohol use screening    05/22/2022    9:53 AM  Alcohol Use Disorder Test (AUDIT)  1. How often do you have a drink containing alcohol? 0  2. How many drinks containing alcohol do you have on a typical day when you are drinking? 0  3. How often do you have six or more drinks on one occasion? 0  AUDIT-C Score 0   A score of 3 or more in women, and 4 or more in men indicates increased risk for alcohol abuse, EXCEPT if all of the points are from question 1     Assessment & Plan     Annual wellness visit done today including the all of the following: Reviewed patient's Family Medical History Reviewed and updated list of patient's medical providers Assessment of cognitive impairment was done Assessed patient's functional ability Established a written schedule for health screening [place] Completed and Reviewed  Exercise Activities and Dietary recommendations  Goals      DIET - REDUCE SUGAR INTAKE     Recommend to cut back on sweets and dessert in diet to help aid with weight loss.         Immunization History  Administered Date(s) Administered   Fluad Quad(high Dose 65+) 10/24/2019, 08/14/2020, 09/03/2021   Influenza, High Dose Seasonal PF 12/21/2016, 10/04/2017, 08/22/2018   PFIZER(Purple Top)SARS-COV-2 Vaccination 12/21/2019, 01/15/2020, 09/13/2020   Pneumococcal Conjugate-13 11/12/2014   Pneumococcal Polysaccharide-23 01/11/2012   Tdap 04/06/2007   Zoster Recombinat (Shingrix) 12/11/2021, 02/08/2022    Health Maintenance  Topic Date Due   TETANUS/TDAP  04/05/2017   COVID-19 Vaccine (4 - Booster for Pfizer series) 11/08/2020   INFLUENZA VACCINE  06/30/2022   MAMMOGRAM  09/15/2022   COLONOSCOPY (Pts 45-46yr Insurance coverage will need to be confirmed)  11/21/2023   DEXA SCAN  05/23/2025   Pneumonia Vaccine 76 Years old  Completed   Hepatitis C Screening  Completed   Zoster Vaccines- Shingrix  Completed   HPV VACCINES  Aged Out      Discussed health benefits of physical activity, and encouraged her to engage in regular exercise appropriate for her age and condition.        The entirety of the information documented in the History of Present Illness, Review of Systems and Physical Exam were personally obtained by me. Portions of this information were initially documented by the CMA and reviewed by me for thoroughness and accuracy.     DLelon Huh MD  [phone](phone) [phone](fax)  [place]

## 2022-06-22 DIAGNOSIS — Z961 Presence of intraocular lens: Secondary | ICD-10-CM | POA: Diagnosis not present

## 2022-06-22 DIAGNOSIS — H26493 Other secondary cataract, bilateral: Secondary | ICD-10-CM | POA: Diagnosis not present

## 2022-06-22 DIAGNOSIS — H35032 Hypertensive retinopathy, left eye: Secondary | ICD-10-CM | POA: Diagnosis not present

## 2022-08-10 ENCOUNTER — Other Ambulatory Visit: Payer: Self-pay | Admitting: Family Medicine

## 2022-08-10 DIAGNOSIS — Z1231 Encounter for screening mammogram for malignant neoplasm of breast: Secondary | ICD-10-CM

## 2022-09-17 ENCOUNTER — Ambulatory Visit
Admission: RE | Admit: 2022-09-17 | Discharge: 2022-09-17 | Disposition: A | Discharge status: Home / Self Care | Payer: Medicare Other | Source: Ambulatory Visit | Attending: Family Medicine | Admitting: Family Medicine

## 2022-09-17 DIAGNOSIS — Z1231 Encounter for screening mammogram for malignant neoplasm of breast: Secondary | ICD-10-CM | POA: Diagnosis not present

## 2022-09-18 ENCOUNTER — Encounter: Payer: Self-pay | Admitting: Family Medicine

## 2022-09-18 ENCOUNTER — Ambulatory Visit (INDEPENDENT_AMBULATORY_CARE_PROVIDER_SITE_OTHER): Payer: Medicare Other | Admitting: Family Medicine

## 2022-09-18 VITALS — BP 127/58 | HR 70 | Temp 98.5°F | Resp 16 | Wt 181.5 lb

## 2022-09-18 DIAGNOSIS — R252 Cramp and spasm: Secondary | ICD-10-CM | POA: Diagnosis not present

## 2022-09-18 DIAGNOSIS — Z23 Encounter for immunization: Secondary | ICD-10-CM | POA: Diagnosis not present

## 2022-09-18 DIAGNOSIS — E559 Vitamin D deficiency, unspecified: Secondary | ICD-10-CM

## 2022-09-18 DIAGNOSIS — I10 Essential (primary) hypertension: Secondary | ICD-10-CM

## 2022-09-18 DIAGNOSIS — E782 Mixed hyperlipidemia: Secondary | ICD-10-CM | POA: Diagnosis not present

## 2022-09-18 NOTE — Progress Notes (Signed)
I,[name],acting as a scribe for Lelon Huh, MD.,have documented all relevant documentation on the behalf of Lelon Huh, MD,as directed by  Lelon Huh, MD while in the presence of Lelon Huh, MD.    Established patient visit   Patient: Lindsay Russo   DOB: 29-Sep-1946   76 y.o. Female  MRN: [id number] Visit Date: 09/18/2022  Today's healthcare provider: Lelon Huh, MD   Chief Complaint  Patient presents with   Hypertension   Subjective    HPI  Hypertension, follow-up  BP Readings from Last 3 Encounters:  09/18/22 (!) 127/58  05/22/22 121/63  02/13/22 130/72   Wt Readings from Last 3 Encounters:  09/18/22 181 lb 8 oz (82.3 kg)  05/22/22 183 lb (83 kg)  02/13/22 178 lb (80.7 kg)     She was last seen for hypertension 3 months ago.  BP at that visit was 121/ 63. Management since that visit includes continuing same medication.  She reports good compliance with treatment. She is not having side effects.  She is following a Regular diet. She is exercising. She does not smoke.  Use of agents associated with hypertension: none.   Outside blood pressures are averaging 150/90. Symptoms: No chest pain No chest pressure  No palpitations No syncope  No dyspnea No orthopnea  No paroxysmal nocturnal dyspnea No lower extremity edema   Pertinent labs Lab Results  Component Value Date   CHOL 168 05/22/2022   HDL 53 05/22/2022   LDLCALC 95 05/22/2022   TRIG 109 05/22/2022   CHOLHDL 3.2 05/22/2022   Lab Results  Component Value Date   NA 144 05/22/2022   K 5.0 05/22/2022   CREATININE 1.06 (H) 05/22/2022   EGFR 54 (L) 05/22/2022   GLUCOSE 99 05/22/2022   TSH 2.900 05/22/2022     The 10-year ASCVD risk score (Arnett DK, et al., 2019) is: 22.7%  ---------------------------------------------------------------------------------------------------   Follow up for Vitamin D Deficiency:  The patient was last seen for this 3 months  ago. Changes made at last visit include advising patient to double the dose of Vitamin D supplement. However she states today she is taking 1 capsule every day.  She reports good compliance with treatment.  -----------------------------------------------------------------------------------------  Her only complaint today is that she continues to have frequent leg cramps, day and night, but worse in the evening. Tizanidine has not helped at all. Otherwise she feels very well.    Medications: Outpatient Medications Prior to Visit  Medication Sig   acetaminophen (TYLENOL) 500 MG tablet Take 1,000 mg by mouth every 6 (six) hours as needed for moderate pain or headache.   amLODipine (NORVASC) 10 MG tablet TAKE 1 TABLET BY MOUTH ONCE DAILY   Cholecalciferol (VITAMIN D) 50 MCG (2000 UT) CAPS Take 1 capsule by mouth daily.   diclofenac Sodium (VOLTAREN) 1 % GEL Apply topically 4 (four) times daily.   lovastatin (MEVACOR) 40 MG tablet TAKE 1 TABLET BY MOUTH AT BEDTIME   meclizine (ANTIVERT) 25 MG tablet Take 1 tablet (25 mg total) by mouth 3 (three) times daily as needed for dizziness.   nystatin (MYCOSTATIN/NYSTOP) powder Apply 1 application topically 3 (three) times daily.   ondansetron (ZOFRAN) 4 MG tablet Take 1 tablet (4 mg total) by mouth every 8 (eight) hours as needed for up to 10 doses for nausea or vomiting.   promethazine (PHENERGAN) 25 MG tablet Take 1 tablet (25 mg total) by mouth every 8 (eight) hours as needed for  nausea or vomiting.   spironolactone (ALDACTONE) 50 MG tablet TAKE 1 TABLET BY MOUTH ONCE DAILY   tiZANidine (ZANAFLEX) 2 MG tablet TAKE 1 OR 2 TABLETS BY MOUTH AT BEDTIME FOR LEG CRAMPS   traMADol (ULTRAM) 50 MG tablet Take 1 tablet (50 mg total) by mouth every 6 (six) hours as needed.   traZODone (DESYREL) 50 MG tablet Take 1-2 tablets (50-100 mg total) by mouth at bedtime as needed for sleep.   venlafaxine XR (EFFEXOR-XR) 75 MG 24 hr capsule TAKE 1 CAPSULE BY MOUTH ONCE  DAILY WITH BREAKFAST   No facility-administered medications prior to visit.    Review of Systems  Constitutional:  Negative for appetite change, chills, fatigue and fever.  Respiratory:  Negative for chest tightness and shortness of breath.   Cardiovascular:  Negative for chest pain and palpitations.  Gastrointestinal:  Negative for abdominal pain, nausea and vomiting.  Musculoskeletal:  Positive for myalgias (leg cramps).  Neurological:  Negative for dizziness and weakness.       Objective    BP (!) 127/58 (BP Location: Left Arm, Patient Position: Sitting, Cuff Size: Large)   Pulse 70   Temp 98.5 F (36.9 C) (Oral)   Resp 16   Wt 181 lb 8 oz (82.3 kg)   SpO2 98% Comment: room air  BMI 29.29 kg/m    Physical Exam   General appearance: Well developed, well nourished female, cooperative and in no acute distress Head: Normocephalic, without obvious abnormality, atraumatic Respiratory: Respirations even and unlabored, normal respiratory rate Extremities: All extremities are intact.  Skin: Skin color, texture, turgor normal. No rashes seen  Psych: Appropriate mood and affect. Neurologic: Mental status: Alert, oriented to person, place, and time, thought content appropriate.    Assessment & Plan     1. Primary hypertension Very well controlled at this point, but spironolactone may be contributing to leg cramps.   2. Leg cramps No benefit from tizanidine.  May be adverse effect of statin, spironolactone and/or vitamin D deficiency.  - Renal function panel  3. Hyperlipidemia, mixed Hold lovastatin x 2 weeks due to leg cramps. Consider changing lipid medication if this makes significant difference.   4. Vitamin D deficiency Taking daily vitamin D supplement, but not sure of dosage.  - VITAMIN D 25 Hydroxy (Vit-D Deficiency, Fractures)  Completed disability parking forms today.   5. Need for immunization against influenza  - Flu Vaccine QUAD High Dose(Fluad)       The entirety of the information documented in the History of Present Illness, Review of Systems and Physical Exam were personally obtained by me. Portions of this information were initially documented by the CMA and reviewed by me for thoroughness and accuracy.     Lelon Huh, MD  [facility] [phone] (phone) [phone] (fax)  [place]

## 2022-09-18 NOTE — Patient Instructions (Signed)
Please review the attached list of medications and notify my office if there are any errors.   Stop taking the lovastatin for the next 2 weeks to see if that helps with the leg cramps. Let me know if this makes any difference or not after a couple of weeks

## 2022-09-19 LAB — RENAL FUNCTION PANEL
Albumin: 4.6 g/dL (ref 3.8–4.8)
BUN/Creatinine Ratio: 17 (ref 12–28)
BUN: 24 mg/dL (ref 8–27)
CO2: 22 mmol/L (ref 20–29)
Calcium: 9.4 mg/dL (ref 8.7–10.3)
Chloride: 101 mmol/L (ref 96–106)
Creatinine, Ser: 1.38 mg/dL — ABNORMAL HIGH (ref 0.57–1.00)
Glucose: 106 mg/dL — ABNORMAL HIGH (ref 70–99)
Phosphorus: 3.8 mg/dL (ref 3.0–4.3)
Potassium: 4.8 mmol/L (ref 3.5–5.2)
Sodium: 141 mmol/L (ref 134–144)
eGFR: 40 mL/min/{1.73_m2} — ABNORMAL LOW (ref >59)

## 2022-09-19 LAB — VITAMIN D 25 HYDROXY (VIT D DEFICIENCY, FRACTURES): Vit D, 25-Hydroxy: 25.6 ng/mL — ABNORMAL LOW (ref 30.0–100.0)

## 2022-10-08 ENCOUNTER — Telehealth: Payer: Self-pay

## 2022-10-08 NOTE — Telephone Encounter (Signed)
Copied from [place] [id number]. Topic: General - Inquiry >> Oct 08, 2022  8:50 AM Penni Bombard wrote: Reason for CRM: congestion and cough x three days.  She wants to know if Dr. Caryn Section will call her in an antibiotic.  CB@  [phone]

## 2022-10-09 ENCOUNTER — Ambulatory Visit (INDEPENDENT_AMBULATORY_CARE_PROVIDER_SITE_OTHER): Payer: Medicare Other | Admitting: Physician Assistant

## 2022-10-09 ENCOUNTER — Encounter: Payer: Self-pay | Admitting: Physician Assistant

## 2022-10-09 VITALS — BP 130/84 | HR 79 | Temp 98.9°F | Resp 16 | Wt 178.0 lb

## 2022-10-09 DIAGNOSIS — J0110 Acute frontal sinusitis, unspecified: Secondary | ICD-10-CM | POA: Diagnosis not present

## 2022-10-09 MED ORDER — LORATADINE 10 MG PO TABS: 10.0000 mg | ORAL_TABLET | Freq: Every day | ORAL | 1 refills | Status: AC

## 2022-10-09 NOTE — Telephone Encounter (Signed)
Seen in the office by [name], PA-C today.

## 2022-10-09 NOTE — Progress Notes (Signed)
I,[name],acting as a Education administrator for [employer], PA-C.,have documented all relevant documentation on the behalf of Lindsay Kirschner, PA-C,as directed by  Lindsay Kirschner, PA-C while in the presence of Lindsay Kirschner, PA-C.   Established patient visit   Patient: Lindsay Russo   DOB: Nov 30, 1946   76 y.o. Female  MRN: [id number] Visit Date: 10/09/2022  Today's healthcare provider: [name], PA-C   Chief Complaint  Patient presents with   Cough   Subjective    HPI   Pt reports nasal congestion, post nasal drip, headaches, cough. Clear discharge. denies chest pain, SOB. Reports taking a negative COVID test at home.   Medications: Outpatient Medications Prior to Visit  Medication Sig   acetaminophen (TYLENOL) 500 MG tablet Take 1,000 mg by mouth every 6 (six) hours as needed for moderate pain or headache.   amLODipine (NORVASC) 10 MG tablet TAKE 1 TABLET BY MOUTH ONCE DAILY   Cholecalciferol (VITAMIN D) 50 MCG (2000 UT) CAPS Take 1 capsule by mouth daily.   diclofenac Sodium (VOLTAREN) 1 % GEL Apply topically 4 (four) times daily.   lovastatin (MEVACOR) 40 MG tablet TAKE 1 TABLET BY MOUTH AT BEDTIME   meclizine (ANTIVERT) 25 MG tablet Take 1 tablet (25 mg total) by mouth 3 (three) times daily as needed for dizziness.   nystatin (MYCOSTATIN/NYSTOP) powder Apply 1 application topically 3 (three) times daily.   ondansetron (ZOFRAN) 4 MG tablet Take 1 tablet (4 mg total) by mouth every 8 (eight) hours as needed for up to 10 doses for nausea or vomiting.   promethazine (PHENERGAN) 25 MG tablet Take 1 tablet (25 mg total) by mouth every 8 (eight) hours as needed for nausea or vomiting.   spironolactone (ALDACTONE) 50 MG tablet TAKE 1 TABLET BY MOUTH ONCE DAILY   tiZANidine (ZANAFLEX) 2 MG tablet TAKE 1 OR 2 TABLETS BY MOUTH AT BEDTIME FOR LEG CRAMPS   traMADol (ULTRAM) 50 MG tablet Take 1 tablet (50 mg total) by mouth every 6 (six) hours as needed.   traZODone (DESYREL) 50 MG  tablet Take 1-2 tablets (50-100 mg total) by mouth at bedtime as needed for sleep.   venlafaxine XR (EFFEXOR-XR) 75 MG 24 hr capsule TAKE 1 CAPSULE BY MOUTH ONCE DAILY WITH BREAKFAST   No facility-administered medications prior to visit.    Review of Systems  Constitutional:  Negative for appetite change, chills, fatigue and fever.  HENT:  Positive for congestion.   Respiratory:  Positive for cough. Negative for chest tightness and shortness of breath.   Cardiovascular:  Negative for chest pain and palpitations.  Gastrointestinal:  Negative for abdominal pain, nausea and vomiting.  Neurological:  Negative for dizziness and weakness.      Objective    BP 130/84 (BP Location: Right Arm, Patient Position: Sitting, Cuff Size: Normal)   Pulse 79   Temp 98.9 F (37.2 C) (Oral)   Resp 16   Wt 178 lb (80.7 kg)   SpO2 98%   BMI 28.73 kg/m    Physical Exam Constitutional:      General: She is awake.     Appearance: She is well-developed.  HENT:     Head: Normocephalic.     Right Ear: Tympanic membrane normal.     Left Ear: A middle ear effusion is present.     Nose: Rhinorrhea present.     Mouth/Throat:     Pharynx: No oropharyngeal exudate.  Eyes:     Conjunctiva/sclera: Conjunctivae normal.  Cardiovascular:     Rate and Rhythm: Normal rate and regular rhythm.     Heart sounds: Normal heart sounds.  Pulmonary:     Effort: Pulmonary effort is normal.     Breath sounds: Normal breath sounds.  Skin:    General: Skin is warm.  Neurological:     Mental Status: She is alert and oriented to person, place, and time.  Psychiatric:        Attention and Perception: Attention normal.        Mood and Affect: Mood normal.        Speech: Speech normal.        Behavior: Behavior is cooperative.     No results found for any visits on 10/09/22.  Assessment & Plan     Acute sinusitis Viral vs allergic Rx claritin, take daily, advised to take during allergy season Pt declines  nasal sprays Advised increase fluids, saline nasal rinse, warm compresses  Return if symptoms worsen or fail to improve.      I, Lindsay Kirschner, PA-C have reviewed all documentation for this visit. The documentation on  10/09/2022 for the exam, diagnosis, procedures, and orders are all accurate and complete.  Lindsay Kirschner, PA-C [facility] [address] [place], [place], [address] Office: [phone] Fax: [place]

## 2023-02-15 ENCOUNTER — Other Ambulatory Visit: Payer: Self-pay | Admitting: Family Medicine

## 2023-02-26 ENCOUNTER — Ambulatory Visit (INDEPENDENT_AMBULATORY_CARE_PROVIDER_SITE_OTHER): Payer: Medicare Other | Admitting: Family Medicine

## 2023-02-26 ENCOUNTER — Encounter: Payer: Self-pay | Admitting: Family Medicine

## 2023-02-26 VITALS — BP 137/68 | HR 65 | Ht 67.0 in | Wt 181.8 lb

## 2023-02-26 DIAGNOSIS — E559 Vitamin D deficiency, unspecified: Secondary | ICD-10-CM | POA: Diagnosis not present

## 2023-02-26 DIAGNOSIS — R252 Cramp and spasm: Secondary | ICD-10-CM

## 2023-02-26 DIAGNOSIS — I10 Essential (primary) hypertension: Secondary | ICD-10-CM | POA: Diagnosis not present

## 2023-02-26 NOTE — Progress Notes (Signed)
I,Sha'taria Tyson,acting as a Education administrator for Lelon Huh, MD.,have documented all relevant documentation on the behalf of Lelon Huh, MD,as directed by  Lelon Huh, MD while in the presence of Lelon Huh, MD.   Established patient visit   Patient: Lindsay Russo   DOB: 1946/01/28   77 y.o. Female  MRN: [id number] Visit Date: 02/26/2023  Today's healthcare provider: Lelon Huh, MD    Subjective    HPI  Hypertension, follow-up  BP Readings from Last 3 Encounters:  10/09/22 130/84  09/18/22 (!) 127/58  05/22/22 121/63   Wt Readings from Last 3 Encounters:  10/09/22 178 lb (80.7 kg)  09/18/22 181 lb 8 oz (82.3 kg)  05/22/22 183 lb (83 kg)     She was last seen for hypertension 5 months ago.  BP at that visit was 127/58. Management since that visit includes continue current treatment.  Outside blood pressures are not checked regularly. Symptoms: No chest pain No chest pressure  No palpitations No syncope  No dyspnea No orthopnea  No paroxysmal nocturnal dyspnea Yes lower extremity edema  ---------------------------------------------------------------------------------------------------  Lipid/Cholesterol, Follow-up  Last lipid panel Other pertinent labs  Lab Results  Component Value Date   CHOL 168 05/22/2022   HDL 53 05/22/2022   LDLCALC 95 05/22/2022   TRIG 109 05/22/2022   CHOLHDL 3.2 05/22/2022   Lab Results  Component Value Date   ALT 11 05/22/2022   AST 15 05/22/2022   PLT 231 05/22/2022   TSH 2.900 05/22/2022     She was last seen for this 5 months ago.  Management since that visit includes hold lovastatin x 2 weeks due to leg cramps. Consider changing lipid medication if this makes significant difference .  She reports excellent compliance with treatment. She is having side effects. Leg cramps  Symptoms: No chest pain No chest pressure/discomfort  No dyspnea No lower extremity edema  No numbness or tingling of extremity No orthopnea   No palpitations No paroxysmal nocturnal dyspnea  No speech difficulty No syncope   The ASCVD Risk score (Arnett DK, et al., 2019) failed to calculate for the following reasons:   The patient has a prior MI or stroke diagnosis  ---------------------------------------------------------------------------------------------------  Vitamin D deficiency, follow-up  Lab Results  Component Value Date   VD25OH 25.6 (L) 09/18/2022   VD25OH 23.8 (L) 05/22/2022   VD25OH 28.8 (L) 11/05/2021   CALCIUM 9.4 09/18/2022   CALCIUM 9.9 05/22/2022        Wt Readings from Last 3 Encounters:  10/09/22 178 lb (80.7 kg)  09/18/22 181 lb 8 oz (82.3 kg)  05/22/22 183 lb (83 kg)    She was last seen for vitamin D deficiency 5 months ago.  Management since that visit includes double the dose of the vitamin D3 supplement. She reports excellent compliance with treatment. She is not having side effects.   Symptoms: No change in energy level No numbness or tingling  No bone pain No unexplained fracture   ---------------------------------------------------------------------------------------------------   Medications: Outpatient Medications Prior to Visit  Medication Sig   acetaminophen (TYLENOL) 500 MG tablet Take 1,000 mg by mouth every 6 (six) hours as needed for moderate pain or headache.   amLODipine (NORVASC) 10 MG tablet TAKE 1 TABLET BY MOUTH ONCE DAILY   Cholecalciferol (VITAMIN D) 50 MCG (2000 UT) CAPS Take 2 capsules by mouth daily.   diclofenac Sodium (VOLTAREN) 1 % GEL Apply topically 4 (four) times daily.   loratadine (CLARITIN) 10 MG  tablet Take 1 tablet (10 mg total) by mouth daily.   lovastatin (MEVACOR) 40 MG tablet TAKE 1 TABLET BY MOUTH AT BEDTIME   meclizine (ANTIVERT) 25 MG tablet Take 1 tablet (25 mg total) by mouth 3 (three) times daily as needed for dizziness.   nystatin (MYCOSTATIN/NYSTOP) powder Apply 1 application topically 3 (three) times daily.   ondansetron (ZOFRAN) 4 MG  tablet Take 1 tablet (4 mg total) by mouth every 8 (eight) hours as needed for up to 10 doses for nausea or vomiting.   promethazine (PHENERGAN) 25 MG tablet Take 1 tablet (25 mg total) by mouth every 8 (eight) hours as needed for nausea or vomiting.   spironolactone (ALDACTONE) 50 MG tablet TAKE 1 TABLET BY MOUTH ONCE DAILY   traMADol (ULTRAM) 50 MG tablet Take 1 tablet (50 mg total) by mouth every 6 (six) hours as needed.   traZODone (DESYREL) 50 MG tablet Take 1-2 tablets (50-100 mg total) by mouth at bedtime as needed for sleep.   venlafaxine XR (EFFEXOR-XR) 75 MG 24 hr capsule TAKE 1 CAPSULE BY MOUTH ONCE DAILY WITH BREAKFAST   tiZANidine (ZANAFLEX) 2 MG tablet TAKE 1 OR 2 TABLETS BY MOUTH AT BEDTIME FOR LEG CRAMPS (Patient not taking: Reported on 02/26/2023)   No facility-administered medications prior to visit.        Objective    BP 137/68 (BP Location: Left Arm, Patient Position: Sitting, Cuff Size: Normal)   Pulse 65   Ht 5\' 7"  (1.702 m)   Wt 181 lb 12.8 oz (82.5 kg)   SpO2 99%   BMI 28.47 kg/m    Physical Exam   General appearance: Well developed, well nourished female, cooperative and in no acute distress Head: Normocephalic, without obvious abnormality, atraumatic Respiratory: Respirations even and unlabored, normal respiratory rate Extremities: All extremities are intact.  Skin: Skin color, texture, turgor normal. No rashes seen  Psych: Appropriate mood and affect. Neurologic: Mental status: Alert, oriented to person, place, and time, thought content appropriate. ]  Assessment & Plan     1. Primary hypertension Well controlled.  Continue current medications.   - Renal function panel  2. Vitamin D deficiency Doing well on increased dose of vitamin D - VITAMIN D 25 Hydroxy (Vit-D Deficiency, Fractures)  3. Leg cramps Has tried tizanidine, tonic water, increase water consumption and b vitamin with no increase. She can tri OTC magnesium supplement  May be related  to spironolactone, although has been on same dose for about 2 years.      The entirety of the information documented in the History of Present Illness, Review of Systems and Physical Exam were personally obtained by me. Portions of this information were initially documented by the CMA and reviewed by me for thoroughness and accuracy.     Lelon Huh, MD  [address] [phone] (phone) [phone] (fax)  [place]

## 2023-02-26 NOTE — Patient Instructions (Signed)
Please review the attached list of medications and notify my office if there are any errors.   Try OTC magnesium oxide 400-500 mg once a day to see if it helps with cramps

## 2023-02-27 LAB — RENAL FUNCTION PANEL
Albumin: 4.5 g/dL (ref 3.8–4.8)
BUN/Creatinine Ratio: 18 (ref 12–28)
BUN: 23 mg/dL (ref 8–27)
CO2: 25 mmol/L (ref 20–29)
Calcium: 10.4 mg/dL — ABNORMAL HIGH (ref 8.7–10.3)
Chloride: 101 mmol/L (ref 96–106)
Creatinine, Ser: 1.28 mg/dL — ABNORMAL HIGH (ref 0.57–1.00)
Glucose: 116 mg/dL — ABNORMAL HIGH (ref 70–99)
Phosphorus: 4.8 mg/dL — ABNORMAL HIGH (ref 3.0–4.3)
Potassium: 4.9 mmol/L (ref 3.5–5.2)
Sodium: 142 mmol/L (ref 134–144)
eGFR: 43 mL/min/{1.73_m2} — ABNORMAL LOW (ref >59)

## 2023-02-27 LAB — VITAMIN D 25 HYDROXY (VIT D DEFICIENCY, FRACTURES): Vit D, 25-Hydroxy: 25.7 ng/mL — ABNORMAL LOW (ref 30.0–100.0)

## 2023-03-16 ENCOUNTER — Telehealth: Payer: Self-pay | Admitting: Family Medicine

## 2023-03-16 DIAGNOSIS — N1832 Chronic kidney disease, stage 3b: Secondary | ICD-10-CM

## 2023-03-16 NOTE — Telephone Encounter (Signed)
Patient just called back to tell you that she completely trust your opinion and if you dont see a need to send her to a nephrologist, then dont.    Its her daughters who are wanting her to go but she is fine not going if you think she doesn't need to go.

## 2023-03-16 NOTE — Telephone Encounter (Signed)
Lindsay Russo family is concerned about her GFR being low(since they have went through this with their dad, larry).  They are requesting a referral to nephrologist, Dr. Thedore Mins please.

## 2023-03-16 NOTE — Addendum Note (Signed)
Addended by: Mila Merry E on: 03/16/2023 12:52 PM   Modules accepted: Orders

## 2023-04-14 ENCOUNTER — Other Ambulatory Visit: Payer: Self-pay | Admitting: Family Medicine

## 2023-04-14 DIAGNOSIS — I10 Essential (primary) hypertension: Secondary | ICD-10-CM

## 2023-04-15 DIAGNOSIS — Z872 Personal history of diseases of the skin and subcutaneous tissue: Secondary | ICD-10-CM | POA: Diagnosis not present

## 2023-04-15 DIAGNOSIS — L578 Other skin changes due to chronic exposure to nonionizing radiation: Secondary | ICD-10-CM | POA: Diagnosis not present

## 2023-04-15 DIAGNOSIS — B372 Candidiasis of skin and nail: Secondary | ICD-10-CM | POA: Diagnosis not present

## 2023-04-19 ENCOUNTER — Other Ambulatory Visit: Payer: Self-pay | Admitting: Family Medicine

## 2023-04-19 DIAGNOSIS — F419 Anxiety disorder, unspecified: Secondary | ICD-10-CM

## 2023-04-20 NOTE — Telephone Encounter (Signed)
Requested Prescriptions  Pending Prescriptions Disp Refills   venlafaxine XR (EFFEXOR-XR) 75 MG 24 hr capsule [Pharmacy Med Name: VENLAFAXINE HCL ER 75 MG CAP] 90 capsule 0    Sig: TAKE 1 CAPSULE BY MOUTH ONCE DAILY WITH BREAKFAST     Psychiatry: Antidepressants - SNRI - desvenlafaxine & venlafaxine Failed - 04/19/2023 10:17 AM      Failed - Cr in normal range and within 360 days    Creatinine  Date Value Ref Range Status  05/04/2013 1.12 0.60 - 1.30 mg/dL Final   Creatinine, Ser  Date Value Ref Range Status  02/26/2023 1.28 (H) 0.57 - 1.00 mg/dL Final         Failed - Lipid Panel in normal range within the last 12 months    Cholesterol, Total  Date Value Ref Range Status  05/22/2022 168 100 - 199 mg/dL Final   LDL Chol Calc (NIH)  Date Value Ref Range Status  05/22/2022 95 0 - 99 mg/dL Final   HDL  Date Value Ref Range Status  05/22/2022 53 >39 mg/dL Final   Triglycerides  Date Value Ref Range Status  05/22/2022 109 0 - 149 mg/dL Final         Passed - Last BP in normal range    BP Readings from Last 1 Encounters:  02/26/23 137/68         Passed - Valid encounter within last 6 months    Recent Outpatient Visits           1 month ago Primary hypertension   [facility] Malva Limes, MD   6 months ago Acute non-recurrent frontal sinusitis   [facility] Alfredia Ferguson, PA-C   7 months ago Primary hypertension   [facility] Malva Limes, MD   11 months ago Annual physical exam   [facility] Malva Limes, MD   1 year ago Pre-diabetes   [facility] Malva Limes, MD

## 2023-05-05 DIAGNOSIS — R8290 Unspecified abnormal findings in urine: Secondary | ICD-10-CM | POA: Diagnosis not present

## 2023-05-05 DIAGNOSIS — I129 Hypertensive chronic kidney disease with stage 1 through stage 4 chronic kidney disease, or unspecified chronic kidney disease: Secondary | ICD-10-CM | POA: Diagnosis not present

## 2023-05-05 DIAGNOSIS — Z87442 Personal history of urinary calculi: Secondary | ICD-10-CM | POA: Diagnosis not present

## 2023-05-05 DIAGNOSIS — N1832 Chronic kidney disease, stage 3b: Secondary | ICD-10-CM | POA: Diagnosis not present

## 2023-05-10 ENCOUNTER — Other Ambulatory Visit: Payer: Self-pay | Admitting: Nephrology

## 2023-05-10 DIAGNOSIS — N1832 Chronic kidney disease, stage 3b: Secondary | ICD-10-CM

## 2023-05-10 DIAGNOSIS — R8290 Unspecified abnormal findings in urine: Secondary | ICD-10-CM

## 2023-05-19 ENCOUNTER — Ambulatory Visit
Admission: RE | Admit: 2023-05-19 | Discharge: 2023-05-19 | Disposition: A | Discharge status: Home / Self Care | Payer: Medicare Other | Source: Ambulatory Visit | Attending: Nephrology | Admitting: Nephrology

## 2023-05-19 DIAGNOSIS — R8290 Unspecified abnormal findings in urine: Secondary | ICD-10-CM | POA: Insufficient documentation

## 2023-05-19 DIAGNOSIS — N1832 Chronic kidney disease, stage 3b: Secondary | ICD-10-CM | POA: Insufficient documentation

## 2023-05-19 DIAGNOSIS — N189 Chronic kidney disease, unspecified: Secondary | ICD-10-CM | POA: Diagnosis not present

## 2023-05-24 ENCOUNTER — Ambulatory Visit (INDEPENDENT_AMBULATORY_CARE_PROVIDER_SITE_OTHER): Payer: Medicare Other

## 2023-05-24 ENCOUNTER — Other Ambulatory Visit: Payer: Self-pay | Admitting: Family Medicine

## 2023-05-24 VITALS — Ht 66.0 in | Wt 181.0 lb

## 2023-05-24 DIAGNOSIS — Z0000 Encounter for general adult medical examination without abnormal findings: Secondary | ICD-10-CM

## 2023-05-24 NOTE — Patient Instructions (Signed)
Lindsay Russo , Thank you for taking time to come for your Medicare Wellness Visit. I appreciate your ongoing commitment to your health goals. Please review the following plan we discussed and let me know if I can assist you in the future.   These are the goals we discussed:  Goals      DIET - REDUCE SUGAR INTAKE     Recommend to cut back on sweets and dessert in diet to help aid with weight loss.         This is a list of the screening recommended for you and due dates:  Health Maintenance  Topic Date Due   DTaP/Tdap/Td vaccine (2 - Td or Tdap) 04/05/2017   COVID-19 Vaccine (4 - 2023-24 season) 07/31/2022   Flu Shot  07/01/2023   Mammogram  09/18/2023   Colon Cancer Screening  11/21/2023   Medicare Annual Wellness Visit  05/23/2024   DEXA scan (bone density measurement)  05/23/2025   Pneumonia Vaccine  Completed   Hepatitis C Screening  Completed   Zoster (Shingles) Vaccine  Completed   HPV Vaccine  Aged Out    Advanced directives: no  Conditions/risks identified: low falls risk  Next appointment: Follow up in one year for your annual wellness visit 05/29/2024 @ 3pm telephone   Preventive Care 65 Years and Older, Female Preventive care refers to lifestyle choices and visits with your health care provider that can promote health and wellness. What does preventive care include? A yearly physical exam. This is also called an annual well check. Dental exams once or twice a year. Routine eye exams. Ask your health care provider how often you should have your eyes checked. Personal lifestyle choices, including: Daily care of your teeth and gums. Regular physical activity. Eating a healthy diet. Avoiding tobacco and drug use. Limiting alcohol use. Practicing safe sex. Taking low-dose aspirin every day. Taking vitamin and mineral supplements as recommended by your health care provider. What happens during an annual well check? The services and screenings done by your health  care provider during your annual well check will depend on your age, overall health, lifestyle risk factors, and family history of disease. Counseling  Your health care provider may ask you questions about your: Alcohol use. Tobacco use. Drug use. Emotional well-being. Home and relationship well-being. Sexual activity. Eating habits. History of falls. Memory and ability to understand (cognition). Work and work Astronomer. Reproductive health. Screening  You may have the following tests or measurements: Height, weight, and BMI. Blood pressure. Lipid and cholesterol levels. These may be checked every 5 years, or more frequently if you are over 24 years old. Skin check. Lung cancer screening. You may have this screening every year starting at age 69 if you have a 30-pack-year history of smoking and currently smoke or have quit within the past 15 years. Fecal occult blood test (FOBT) of the stool. You may have this test every year starting at age 77. Flexible sigmoidoscopy or colonoscopy. You may have a sigmoidoscopy every 5 years or a colonoscopy every 10 years starting at age 61. Hepatitis C blood test. Hepatitis B blood test. Sexually transmitted disease (STD) testing. Diabetes screening. This is done by checking your blood sugar (glucose) after you have not eaten for a while (fasting). You may have this done every 1-3 years. Bone density scan. This is done to screen for osteoporosis. You may have this done starting at age 75. Mammogram. This may be done every 1-2 years. Talk to your  health care provider about how often you should have regular mammograms. Talk with your health care provider about your test results, treatment options, and if necessary, the need for more tests. Vaccines  Your health care provider may recommend certain vaccines, such as: Influenza vaccine. This is recommended every year. Tetanus, diphtheria, and acellular pertussis (Tdap, Td) vaccine. You may need a Td  booster every 10 years. Zoster vaccine. You may need this after age 66. Pneumococcal 13-valent conjugate (PCV13) vaccine. One dose is recommended after age 7. Pneumococcal polysaccharide (PPSV23) vaccine. One dose is recommended after age 53. Talk to your health care provider about which screenings and vaccines you need and how often you need them. This information is not intended to replace advice given to you by your health care provider. Make sure you discuss any questions you have with your health care provider. Document Released: 12/13/2015 Document Revised: 08/05/2016 Document Reviewed: 09/17/2015 Elsevier Interactive Patient Education  2017 [employer].  Fall Prevention in the Home Falls can cause injuries. They can happen to people of all ages. There are many things you can do to make your home safe and to help prevent falls. What can I do on the outside of my home? Regularly fix the edges of walkways and driveways and fix any cracks. Remove anything that might make you trip as you walk through a door, such as a raised step or threshold. Trim any bushes or trees on the path to your home. Use bright outdoor lighting. Clear any walking paths of anything that might make someone trip, such as rocks or tools. Regularly check to see if handrails are loose or broken. Make sure that both sides of any steps have handrails. Any raised decks and porches should have guardrails on the edges. Have any leaves, snow, or ice cleared regularly. Use sand or salt on walking paths during winter. Clean up any spills in your garage right away. This includes oil or grease spills. What can I do in the bathroom? Use night lights. Install grab bars by the toilet and in the tub and shower. Do not use towel bars as grab bars. Use non-skid mats or decals in the tub or shower. If you need to sit down in the shower, use a plastic, non-slip stool. Keep the floor dry. Clean up any water that spills on the floor  as soon as it happens. Remove soap buildup in the tub or shower regularly. Attach bath mats securely with double-sided non-slip rug tape. Do not have throw rugs and other things on the floor that can make you trip. What can I do in the bedroom? Use night lights. Make sure that you have a light by your bed that is easy to reach. Do not use any sheets or blankets that are too big for your bed. They should not hang down onto the floor. Have a firm chair that has side arms. You can use this for support while you get dressed. Do not have throw rugs and other things on the floor that can make you trip. What can I do in the kitchen? Clean up any spills right away. Avoid walking on wet floors. Keep items that you use a lot in easy-to-reach places. If you need to reach something above you, use a strong step stool that has a grab bar. Keep electrical cords out of the way. Do not use floor polish or wax that makes floors slippery. If you must use wax, use non-skid floor wax. Do not have  throw rugs and other things on the floor that can make you trip. What can I do with my stairs? Do not leave any items on the stairs. Make sure that there are handrails on both sides of the stairs and use them. Fix handrails that are broken or loose. Make sure that handrails are as long as the stairways. Check any carpeting to make sure that it is firmly attached to the stairs. Fix any carpet that is loose or worn. Avoid having throw rugs at the top or bottom of the stairs. If you do have throw rugs, attach them to the floor with carpet tape. Make sure that you have a light switch at the top of the stairs and the bottom of the stairs. If you do not have them, ask someone to add them for you. What else can I do to help prevent falls? Wear shoes that: Do not have high heels. Have rubber bottoms. Are comfortable and fit you well. Are closed at the toe. Do not wear sandals. If you use a stepladder: Make sure that it is  fully opened. Do not climb a closed stepladder. Make sure that both sides of the stepladder are locked into place. Ask someone to hold it for you, if possible. Clearly mark and make sure that you can see: Any grab bars or handrails. First and last steps. Where the edge of each step is. Use tools that help you move around (mobility aids) if they are needed. These include: Canes. Walkers. Scooters. Crutches. Turn on the lights when you go into a dark area. Replace any light bulbs as soon as they burn out. Set up your furniture so you have a clear path. Avoid moving your furniture around. If any of your floors are uneven, fix them. If there are any pets around you, be aware of where they are. Review your medicines with your doctor. Some medicines can make you feel dizzy. This can increase your chance of falling. Ask your doctor what other things that you can do to help prevent falls. This information is not intended to replace advice given to you by your health care provider. Make sure you discuss any questions you have with your health care provider. Document Released: 09/12/2009 Document Revised: 04/23/2016 Document Reviewed: 12/21/2014 Elsevier Interactive Patient Education  2017 [employer].

## 2023-05-24 NOTE — Progress Notes (Signed)
Subjective:   Lindsay Russo is a 77 y.o. female who presents for Medicare Annual (Subsequent) preventive examination.  Visit Complete: Virtual  I connected with  Lindsay Russo on 05/24/23 by a audio enabled telemedicine application and verified that I am speaking with the correct person using two identifiers.  Patient Location: Home  Provider Location: Office/Clinic  I discussed the limitations of evaluation and management by telemedicine. The patient expressed understanding and agreed to proceed.  Patient Medicare AWV questionnaire was completed by the patient on not done; I have confirmed that all information answered by patient is correct and no changes since this date.  Review of Systems   Cardiac Risk Factors include: advanced age (>64men, >67 women);dyslipidemia;hypertension   Objective:    Today's Vitals   05/24/23 1456 05/24/23 1458  Weight: 181 lb (82.1 kg)   Height: 5\' 6"  (1.676 m)   PainSc:  8    Body mass index is 29.21 kg/m.     05/24/2023    3:12 PM 09/24/2021   10:30 AM 09/24/2021   10:29 AM 11/20/2020    7:17 AM 04/30/2020    1:32 PM 01/28/2018    8:32 AM 10/26/2016    4:55 PM  Advanced Directives  Does Patient Have a Medical Advance Directive? No No Yes No No No No  Type of Advance Directive   Healthcare Power of Attorney      Does patient want to make changes to medical advance directive?  No - Patient declined No - Patient declined      Copy of Healthcare Power of Attorney in Chart?   No - copy requested      Would patient like information on creating a medical advance directive?   No - Patient declined  No - Patient declined No - Patient declined No - Patient declined    Current Medications (verified) Outpatient Encounter Medications as of 05/24/2023  Medication Sig   acetaminophen (TYLENOL) 500 MG tablet Take 1,000 mg by mouth every 6 (six) hours as needed for moderate pain or headache.   amLODipine (NORVASC) 10 MG tablet TAKE 1 TABLET BY MOUTH  ONCE DAILY   Cholecalciferol (VITAMIN D) 50 MCG (2000 UT) CAPS Take 2 capsules by mouth daily.   diclofenac Sodium (VOLTAREN) 1 % GEL Apply topically 4 (four) times daily.   loratadine (CLARITIN) 10 MG tablet Take 1 tablet (10 mg total) by mouth daily.   lovastatin (MEVACOR) 40 MG tablet TAKE 1 TABLET BY MOUTH AT BEDTIME   meclizine (ANTIVERT) 25 MG tablet Take 1 tablet (25 mg total) by mouth 3 (three) times daily as needed for dizziness.   nystatin (MYCOSTATIN/NYSTOP) powder Apply 1 application topically 3 (three) times daily.   ondansetron (ZOFRAN) 4 MG tablet Take 1 tablet (4 mg total) by mouth every 8 (eight) hours as needed for up to 10 doses for nausea or vomiting.   promethazine (PHENERGAN) 25 MG tablet Take 1 tablet (25 mg total) by mouth every 8 (eight) hours as needed for nausea or vomiting.   spironolactone (ALDACTONE) 50 MG tablet TAKE 1 TABLET BY MOUTH ONCE DAILY   tiZANidine (ZANAFLEX) 2 MG tablet TAKE 1 OR 2 TABLETS BY MOUTH AT BEDTIME FOR LEG CRAMPS   traMADol (ULTRAM) 50 MG tablet Take 1 tablet (50 mg total) by mouth every 6 (six) hours as needed.   traZODone (DESYREL) 50 MG tablet Take 1-2 tablets (50-100 mg total) by mouth at bedtime as needed for sleep.   venlafaxine XR (EFFEXOR-XR)  75 MG 24 hr capsule TAKE 1 CAPSULE BY MOUTH ONCE DAILY WITH BREAKFAST   No facility-administered encounter medications on file as of 05/24/2023.    Allergies (verified) Tegaderm ag mesh [silver], Amoxicillin, Chlorthalidone, and Sulfa antibiotics   History: Past Medical History:  Diagnosis Date   Anemia    Breast cancer (HCC) 2014   left breast LCIS   History of kidney stones    Hypertension    LCIS September 05, 2013   Left breast, ER 90%, PR 60%   Lobular carcinoma in situ 09/26/2013   Malignant neoplasm of ascending colon [facility]) November 2007   T2, N0.5.2 cm low-grade adenocarcinoma. 0/28 nodes positive.   Personal history of colonic polyps    Spinal headache 1995   at [facility] spinal  made me feel dead, had no control.   Stage 3 chronic kidney disease (HCC) 02/10/2021   Stroke (HCC) 09/17/2010   Right thalamic without residual effects   Tubulovillous adenoma polyp of colon 10/06/2017   2 polyps, 5 and 10 mm completely resected from the area of the ileocolic anastomosis.  Tubulovillous polyp with high-grade dysplasia.   Past Surgical History:  Procedure Laterality Date   APPENDECTOMY  1995   BREAST BIOPSY Right August 07, 2010   Sclerotic fibroadenoma removed from the right breast at the 10:00 position   BREAST BIOPSY Left 08/14/2013   left breast superior LCIS. per path report: Pleomorphic LCIS has similar behavior to DCIS.   BREAST BIOPSY Left 08/14/2013   left breast superior fibroadenoma   BREAST BIOPSY Left 08/23/2017   stereo bx with Dr. Lemar Livings of lower inner quad. FIBROADENOMATOID STROMAL FIBROSIS WITH ASSOCIATED CALCIFICATIONS.    BREAST BIOPSY     BREAST LUMPECTOMY Left 09/05/2013   LCIS, clear margins   BREAST SURGERY Left 09/05/13   LCIS, ER 90%, PR 60%, negative margins.   COLONOSCOPY  August 03, 2012   8 mm tubulovillous adenoma removed from the transverse colon. Followup exam in 2018 plan.   COLONOSCOPY WITH PROPOFOL N/A 10/06/2017   Procedure: COLONOSCOPY WITH PROPOFOL;  Surgeon: [name], MD;  Location: [facility] ENDOSCOPY;  Service: Endoscopy;  Laterality: N/A;   COLONOSCOPY WITH PROPOFOL N/A 11/20/2020   Procedure: COLONOSCOPY WITH PROPOFOL;  Surgeon: [name], MD;  Location: [facility] ENDOSCOPY;  Service: Endoscopy;  Laterality: N/A;   DOPPLER ECHOCARDIOGRAPHY  09/17/2010   EF 55%, Mild to moderate MR and mild to moderate TR   HEEL SPUR EXCISION Left 2005   JOINT REPLACEMENT Right 2012   KNEE ARTHROPLASTY Left 10/26/2016   Procedure: COMPUTER ASSISTED TOTAL KNEE ARTHROPLASTY;  Surgeon: Donato Heinz, MD;  Location: [facility] ORS;  Service: Orthopedics;  Laterality: Left;   LUMBAR DISC SURGERY  2009   L4-5 microdiscectomy Dr. Gerrit Heck    OVARIAN CYST REMOVAL  1980   PARTIAL COLECTOMY Right 2007   Dr. Lemar Livings   REPLACEMENT TOTAL KNEE Right 12/2010   Family History  Problem Relation Age of Onset   Gastric cancer Father    Brain cancer Father    Hypertension Sister    Heart disease Other    Ulcers Other    Epilepsy Other    Social History   Socioeconomic History   Marital status: Widowed    Spouse name: Not on file   Number of children: 2   Years of education: Not on file   Highest education level: Associate degree: academic program  Occupational History   Occupation: Retired  Tobacco Use   Smoking status:  Never   Smokeless tobacco: Never  Vaping Use   Vaping Use: Never used  Substance and Sexual Activity   Alcohol use: No   Drug use: No   Sexual activity: Not on file  Other Topics Concern   Not on file  Social History Narrative   Not on file   Social Determinants of Health   Financial Resource Strain: Low Risk  (05/24/2023)   Overall Financial Resource Strain (CARDIA)    Difficulty of Paying Living Expenses: Not hard at all  Food Insecurity: No Food Insecurity (05/24/2023)   Hunger Vital Sign    Worried About Running Out of Food in the Last Year: Never true    Ran Out of Food in the Last Year: Never true  Transportation Needs: No Transportation Needs (05/24/2023)   PRAPARE - Administrator, Civil Service (Medical): No    Lack of Transportation (Non-Medical): No  Physical Activity: Sufficiently Active (05/24/2023)   Exercise Vital Sign    Days of Exercise per Week: 5 days    Minutes of Exercise per Session: 30 min  Stress: No Stress Concern Present (05/24/2023)   [employer] of Occupational Health - Occupational Stress Questionnaire    Feeling of Stress : Not at all  Social Connections: Socially Isolated (05/24/2023)   Social Connection and Isolation Panel [NHANES]    Frequency of Communication with Friends and Family: More than three times a week    Frequency of Social Gatherings  with Friends and Family: More than three times a week    Attends Religious Services: Never    Database administrator or Organizations: No    Attends Banker Meetings: Never    Marital Status: Widowed    Tobacco Counseling Counseling given: Not Answered  Clinical Intake:  Pre-visit preparation completed: Yes  Pain : 0-10 Pain Score: 8  Pain Type: Chronic pain Pain Location: Leg Pain Orientation: Right Pain Descriptors / Indicators: Aching, Sharp, Spasm Pain Onset: More than a month ago Pain Frequency: Intermittent Pain Relieving Factors: vitamins,heating pad  Pain Relieving Factors: vitamins,heating pad  BMI - recorded: 29.21 Nutritional Status: BMI 25 -29 Overweight Nutritional Risks: None Diabetes: No  How often do you need to have someone help you when you read instructions, pamphlets, or other written materials from your doctor or pharmacy?: 1 - Never  Interpreter Needed?: No  Comments: daughter lives with her Information entered by :: B.Miski Feldpausch,LPN   Activities of Daily Living    05/24/2023    3:13 PM 02/26/2023    1:19 PM  In your present state of health, do you have any difficulty performing the following activities:  Hearing? 0 0  Vision? 0 0  Difficulty concentrating or making decisions? 0 0  Walking or climbing stairs? 0 0  Dressing or bathing? 0 0  Doing errands, shopping? 0 0  Preparing Food and eating ? N   Using the Toilet? N   In the past six months, have you accidently leaked urine? Y   Do you have problems with loss of bowel control? N   Managing your Medications? N   Managing your Finances? N   Housekeeping or managing your Housekeeping? N     Patient Care Team: Malva Limes, MD as PCP - General (Family Medicine) Lemar Livings, Merrily Pew, MD (General Surgery) Isla Pence, OD as Consulting Physician (Optometry) Hooten, Illene Labrador, MD as Consulting Physician (Orthopedic Surgery) Jesusita Oka, MD  (Dermatology) Jesusita Oka, MD as Referring Physician (Dermatology)  Indicate any recent Medical Services you may have received from other than [facility] providers in the past year (date may be approximate).     Assessment:   This is a routine wellness examination for [place].  Hearing/Vision screen Hearing Screening - Comments:: Adequate hearing Vision Screening - Comments:: Adequate vision w/glasses Dr Clydene Pugh  Dietary issues and exercise activities discussed:     Goals Addressed             This Visit's Progress    DIET - REDUCE SUGAR INTAKE   On track    Recommend to cut back on sweets and dessert in diet to help aid with weight loss.        Depression Screen    05/24/2023    3:10 PM 02/26/2023    1:19 PM 09/18/2022    8:29 AM 05/22/2022    9:53 AM 02/13/2022    8:29 AM 11/05/2021    8:10 AM 09/30/2021    1:59 PM  PHQ 2/9 Scores  PHQ - 2 Score 0 0 0 0 0 0 0  PHQ- 9 Score  1 1 1  0 1 2    Fall Risk    05/24/2023    3:02 PM 02/26/2023    1:19 PM 09/18/2022    8:30 AM 05/22/2022    9:53 AM 02/13/2022    8:29 AM  Fall Risk   Falls in the past year? 0 0 1 1 1   Number falls in past yr: 0 0 0 0 0  Injury with Fall? 0 0 0 0 1  Risk for fall due to : No Fall Risks No Fall Risks History of fall(s) No Fall Risks History of fall(s)  Follow up Education provided;Falls prevention discussed Falls evaluation completed Falls prevention discussed;Falls evaluation completed Falls evaluation completed Falls evaluation completed    MEDICARE RISK AT HOME:  Medicare Risk at Home - 05/24/23 1502     Any stairs in or around the home? Yes    If so, are there any without handrails? Yes    Home free of loose throw rugs in walkways, pet beds, electrical cords, etc? Yes    Adequate lighting in your home to reduce risk of falls? Yes    Life alert? No    Use of a cane, walker or w/c? No    Grab bars in the bathroom? Yes    Shower chair or bench in shower? Yes   does not use   Elevated  toilet seat or a handicapped toilet? Yes             TIMED UP AND GO:  Was the test performed?  No    Cognitive Function:        05/24/2023    3:15 PM 04/30/2020    1:38 PM  6CIT Screen  What Year? 0 points 0 points  What month? 0 points 0 points  What time? 0 points 0 points  Count back from 20 0 points 0 points  Months in reverse 0 points 0 points  Repeat phrase 0 points 0 points  Total Score 0 points 0 points    Immunizations Immunization History  Administered Date(s) Administered   Fluad Quad(high Dose 65+) 10/24/2019, 08/14/2020, 09/03/2021, 09/18/2022   Influenza, High Dose Seasonal PF 12/21/2016, 10/04/2017, 08/22/2018   PFIZER(Purple Top)SARS-COV-2 Vaccination 12/21/2019, 01/15/2020, 09/13/2020   Pneumococcal Conjugate-13 11/12/2014   Pneumococcal Polysaccharide-23 01/11/2012   Tdap 04/06/2007   Zoster Recombinat (Shingrix) 12/11/2021, 02/08/2022    TDAP status: Up to  date  Flu Vaccine status: Up to date  Pneumococcal vaccine status: Up to date  Covid-19 vaccine status: Completed vaccines  Qualifies for Shingles Vaccine? Yes   Zostavax completed Yes   Shingrix Completed?: Yes  Screening Tests Health Maintenance  Topic Date Due   DTaP/Tdap/Td (2 - Td or Tdap) 04/05/2017   COVID-19 Vaccine (4 - 2023-24 season) 07/31/2022   INFLUENZA VACCINE  07/01/2023   MAMMOGRAM  09/18/2023   Colonoscopy  11/21/2023   Medicare Annual Wellness (AWV)  05/23/2024   DEXA SCAN  05/23/2025   Pneumonia Vaccine 35+ Years old  Completed   Hepatitis C Screening  Completed   Zoster Vaccines- Shingrix  Completed   HPV VACCINES  Aged Out    Health Maintenance  Health Maintenance Due  Topic Date Due   DTaP/Tdap/Td (2 - Td or Tdap) 04/05/2017   COVID-19 Vaccine (4 - 2023-24 season) 07/31/2022    Colorectal cancer screening: Type of screening: Colonoscopy. Completed yes. Repeat every 5 years  Mammogram status: Completed yes. Repeat every year  Bone Density status:  Completed yes. Results reflect: Bone density results: NORMAL. Repeat every 5 years.  Lung Cancer Screening: (Low Dose CT Chest recommended if Age 60-80 years, 20 pack-year currently smoking OR have quit w/in 15years.) does not qualify.   Lung Cancer Screening Referral: no  Additional Screening:  Hepatitis C Screening: does not qualify; Completed yes  Vision Screening: Recommended annual ophthalmology exams for early detection of glaucoma and other disorders of the eye. Is the patient up to date with their annual eye exam?  Yes  Who is the provider or what is the name of the office in which the patient attends annual eye exams? [facility] If pt is not established with a provider, would they like to be referred to a provider to establish care? No .   Dental Screening: Recommended annual dental exams for proper oral hygiene  Diabetic Foot Exam: n/a  Community Resource Referral / Chronic Care Management: CRR required this visit?  No   CCM required this visit?  No    APPT MADE FOR PE WITH PCP     Plan:     I have personally reviewed and noted the following in the patient's chart:   Medical and social history Use of alcohol, tobacco or illicit drugs  Current medications and supplements including opioid prescriptions. Patient is not currently taking opioid prescriptions. Functional ability and status Nutritional status Physical activity Advanced directives List of other physicians Hospitalizations, surgeries, and ER visits in previous 12 months Vitals Screenings to include cognitive, depression, and falls Referrals and appointments  In addition, I have reviewed and discussed with patient certain preventive protocols, quality metrics, and best practice recommendations. A written personalized care plan for preventive services as well as general preventive health recommendations were provided to patient.     Sue Lush, LPN   1/61/0960   After Visit Summary: (MyChart) Due  to this being a telephonic visit, the after visit summary with patients personalized plan was offered to patient via MyChart   Nurse Notes: The patient states she is doing well and has no concerns or questions at this time.

## 2023-05-25 NOTE — Telephone Encounter (Signed)
Requested Prescriptions  Refused Prescriptions Disp Refills   lovastatin (MEVACOR) 40 MG tablet [Pharmacy Med Name: LOVASTATIN 40 MG TAB] 90 tablet 1    Sig: TAKE 1 TABLET BY MOUTH AT BEDTIME     Cardiovascular:  Antilipid - Statins 2 Failed - 05/24/2023 10:21 AM      Failed - Cr in normal range and within 360 days    Creatinine  Date Value Ref Range Status  05/04/2013 1.12 0.60 - 1.30 mg/dL Final   Creatinine, Ser  Date Value Ref Range Status  02/26/2023 1.28 (H) 0.57 - 1.00 mg/dL Final         Failed - Lipid Panel in normal range within the last 12 months    Cholesterol, Total  Date Value Ref Range Status  05/22/2022 168 100 - 199 mg/dL Final   LDL Chol Calc (NIH)  Date Value Ref Range Status  05/22/2022 95 0 - 99 mg/dL Final   HDL  Date Value Ref Range Status  05/22/2022 53 >39 mg/dL Final   Triglycerides  Date Value Ref Range Status  05/22/2022 109 0 - 149 mg/dL Final         Passed - Patient is not pregnant      Passed - Valid encounter within last 12 months    Recent Outpatient Visits           2 months ago Primary hypertension   [place] [facility] Malva Limes, MD   7 months ago Acute non-recurrent frontal sinusitis   [place] [facility] Alfredia Ferguson, PA-C   8 months ago Primary hypertension   [place] [facility] Malva Limes, MD   1 year ago Annual physical exam   [facility] Malva Limes, MD   1 year ago Pre-diabetes   [facility] Malva Limes, MD       Future Appointments             In 1 month Fisher, Demetrios Isaacs, MD [facility]

## 2023-06-07 DIAGNOSIS — R8290 Unspecified abnormal findings in urine: Secondary | ICD-10-CM | POA: Diagnosis not present

## 2023-06-07 DIAGNOSIS — Z87442 Personal history of urinary calculi: Secondary | ICD-10-CM | POA: Diagnosis not present

## 2023-06-07 DIAGNOSIS — I129 Hypertensive chronic kidney disease with stage 1 through stage 4 chronic kidney disease, or unspecified chronic kidney disease: Secondary | ICD-10-CM | POA: Diagnosis not present

## 2023-06-07 DIAGNOSIS — N1832 Chronic kidney disease, stage 3b: Secondary | ICD-10-CM | POA: Diagnosis not present

## 2023-06-14 DIAGNOSIS — N200 Calculus of kidney: Secondary | ICD-10-CM | POA: Diagnosis not present

## 2023-06-14 DIAGNOSIS — N1831 Chronic kidney disease, stage 3a: Secondary | ICD-10-CM | POA: Diagnosis not present

## 2023-06-14 DIAGNOSIS — I10 Essential (primary) hypertension: Secondary | ICD-10-CM | POA: Diagnosis not present

## 2023-06-14 DIAGNOSIS — I129 Hypertensive chronic kidney disease with stage 1 through stage 4 chronic kidney disease, or unspecified chronic kidney disease: Secondary | ICD-10-CM | POA: Diagnosis not present

## 2023-06-21 ENCOUNTER — Other Ambulatory Visit: Payer: Self-pay | Admitting: Family Medicine

## 2023-06-21 DIAGNOSIS — I10 Essential (primary) hypertension: Secondary | ICD-10-CM

## 2023-06-24 DIAGNOSIS — H26493 Other secondary cataract, bilateral: Secondary | ICD-10-CM | POA: Diagnosis not present

## 2023-06-24 DIAGNOSIS — H35032 Hypertensive retinopathy, left eye: Secondary | ICD-10-CM | POA: Diagnosis not present

## 2023-06-24 DIAGNOSIS — Z961 Presence of intraocular lens: Secondary | ICD-10-CM | POA: Diagnosis not present

## 2023-07-05 ENCOUNTER — Encounter: Payer: Self-pay | Admitting: Family Medicine

## 2023-07-05 ENCOUNTER — Ambulatory Visit (INDEPENDENT_AMBULATORY_CARE_PROVIDER_SITE_OTHER): Payer: Medicare Other | Admitting: Family Medicine

## 2023-07-05 VITALS — BP 136/69 | HR 69 | Ht 66.0 in | Wt 183.5 lb

## 2023-07-05 DIAGNOSIS — I700 Atherosclerosis of aorta: Secondary | ICD-10-CM | POA: Diagnosis not present

## 2023-07-05 DIAGNOSIS — Z0000 Encounter for general adult medical examination without abnormal findings: Secondary | ICD-10-CM

## 2023-07-05 DIAGNOSIS — N183 Chronic kidney disease, stage 3 unspecified: Secondary | ICD-10-CM

## 2023-07-05 DIAGNOSIS — E559 Vitamin D deficiency, unspecified: Secondary | ICD-10-CM | POA: Diagnosis not present

## 2023-07-05 DIAGNOSIS — I679 Cerebrovascular disease, unspecified: Secondary | ICD-10-CM

## 2023-07-05 DIAGNOSIS — I10 Essential (primary) hypertension: Secondary | ICD-10-CM | POA: Diagnosis not present

## 2023-07-05 DIAGNOSIS — Z23 Encounter for immunization: Secondary | ICD-10-CM

## 2023-07-05 MED ORDER — TETANUS-DIPHTH-ACELL PERTUSSIS 5-2.5-18.5 LF-MCG/0.5 IM SUSY: 0.5000 mL | PREFILLED_SYRINGE | Freq: Once | INTRAMUSCULAR | 0 refills | Status: AC

## 2023-07-05 NOTE — Progress Notes (Signed)
Complete physical exam   Patient: Lindsay Russo   DOB: 03-Feb-1946   77 y.o. Female  MRN: [id number] Visit Date: 07/05/2023  Today's healthcare provider: Mila Merry, MD   Chief Complaint  Patient presents with   Annual Exam   Hypertension   Hyperlipidemia   Subjective    Lindsay Russo is a 77 y.o. female who presents today for a complete physical exam.  She reports consuming a  regular  diet.  She generally feels well. She reports sleeping fairly well. She does not have additional problems to discuss today.   Is also here to follow up on hypertension and lipids, doing well with current medications with no known adverse effects.   Past Medical History:  Diagnosis Date   Anemia    Breast cancer (HCC) 2014   left breast LCIS   History of kidney stones    Hypertension    LCIS September 05, 2013   Left breast, ER 90%, PR 60%   Lobular carcinoma in situ 09/26/2013   Malignant neoplasm of ascending colon [facility]) November 2007   T2, N0.5.2 cm low-grade adenocarcinoma. 0/28 nodes positive.   Personal history of colonic polyps    Spinal headache 1995   at [facility] spinal made me feel dead, had no control.   Stage 3 chronic kidney disease (HCC) 02/10/2021   Stroke (HCC) 09/17/2010   Right thalamic without residual effects   Tubulovillous adenoma polyp of colon 10/06/2017   2 polyps, 5 and 10 mm completely resected from the area of the ileocolic anastomosis.  Tubulovillous polyp with high-grade dysplasia.   Past Surgical History:  Procedure Laterality Date   APPENDECTOMY  1995   BREAST BIOPSY Right August 07, 2010   Sclerotic fibroadenoma removed from the right breast at the 10:00 position   BREAST BIOPSY Left 08/14/2013   left breast superior LCIS. per path report: Pleomorphic LCIS has similar behavior to DCIS.   BREAST BIOPSY Left 08/14/2013   left breast superior fibroadenoma   BREAST BIOPSY Left 08/23/2017   stereo bx with Dr. Lemar Livings of lower inner quad.  FIBROADENOMATOID STROMAL FIBROSIS WITH ASSOCIATED CALCIFICATIONS.    BREAST BIOPSY     BREAST LUMPECTOMY Left 09/05/2013   LCIS, clear margins   BREAST SURGERY Left 09/05/13   LCIS, ER 90%, PR 60%, negative margins.   COLONOSCOPY  August 03, 2012   8 mm tubulovillous adenoma removed from the transverse colon. Followup exam in 2018 plan.   COLONOSCOPY WITH PROPOFOL N/A 10/06/2017   Procedure: COLONOSCOPY WITH PROPOFOL;  Surgeon: [name], MD;  Location: [facility] END[facility]COPY;  Service: Endoscopy;  Laterality: N/A;   COLONOSCOPY WITH PROPOFOL N/A 11/20/2020   Procedure: COLONOSCOPY WITH PROPOFOL;  Surgeon: [name], MD;  Location: [facility] ENDOSCOPY;  Service: Endoscopy;  Laterality: N/A;   DOPPLER ECHOCARDIOGRAPHY  09/17/2010   EF 55%, Mild to moderate MR and mild to moderate TR   HEEL SPUR EXCISION Left 2005   JOINT REPLACEMENT Right 2012   KNEE ARTHROPLASTY Left 10/26/2016   Procedure: COMPUTER ASSISTED TOTAL KNEE ARTHROPLASTY;  Surgeon: Donato Heinz, MD;  Location: [facility] ORS;  Service: Orthopedics;  Laterality: Left;   LUMBAR DISC SURGERY  2009   L4-5 microdiscectomy Dr. Gerrit Heck   OVARIAN CYST REMOVAL  1980   PARTIAL COLECTOMY Right 2007   Dr. Lemar Livings   REPLACEMENT TOTAL KNEE Right 12/2010   Social History   Socioeconomic History   Marital status: Widowed    Spouse name:  Not on file   Number of children: 2   Years of education: Not on file   Highest education level: Associate degree: academic program  Occupational History   Occupation: Retired  Tobacco Use   Smoking status: Never   Smokeless tobacco: Never  Vaping Use   Vaping status: Never Used  Substance and Sexual Activity   Alcohol use: No   Drug use: No   Sexual activity: Not on file  Other Topics Concern   Not on file  Social History Narrative   Not on file   Social Determinants of Health   Financial Resource Strain: Low Risk  (05/24/2023)   Overall Financial Resource Strain (CARDIA)    Difficulty  of Paying Living Expenses: Not hard at all  Food Insecurity: No Food Insecurity (05/24/2023)   Hunger Vital Sign    Worried About Running Out of Food in the Last Year: Never true    Ran Out of Food in the Last Year: Never true  Transportation Needs: No Transportation Needs (05/24/2023)   PRAPARE - Administrator, Civil Service (Medical): No    Lack of Transportation (Non-Medical): No  Physical Activity: Sufficiently Active (05/24/2023)   Exercise Vital Sign    Days of Exercise per Week: 5 days    Minutes of Exercise per Session: 30 min  Stress: No Stress Concern Present (05/24/2023)   [employer] of Occupational Health - Occupational Stress Questionnaire    Feeling of Stress : Not at all  Social Connections: Socially Isolated (05/24/2023)   Social Connection and Isolation Panel [NHANES]    Frequency of Communication with Friends and Family: More than three times a week    Frequency of Social Gatherings with Friends and Family: More than three times a week    Attends Religious Services: Never    Database administrator or Organizations: No    Attends Banker Meetings: Never    Marital Status: Widowed  Intimate Partner Violence: Not At Risk (05/24/2023)   Humiliation, Afraid, Rape, and Kick questionnaire    Fear of Current or Ex-Partner: No    Emotionally Abused: No    Physically Abused: No    Sexually Abused: No   Family Status  Relation Name Status   Father  Deceased   Mother  Deceased       died from sepsis   Sister  Alive   Other  (Not Specified)  No partnership data on file   Family History  Problem Relation Age of Onset   Gastric cancer Father    Brain cancer Father    Hypertension Sister    Heart disease Other    Ulcers Other    Epilepsy Other    Allergies  Allergen Reactions   Tegaderm Ag Mesh [Silver] Other (See Comments)    blisters   Amoxicillin Hives and Rash    Has patient had a PCN reaction causing immediate rash,  facial/tongue/throat swelling, SOB or lightheadedness with hypotension: Yes Has patient had a PCN reaction causing severe rash involving mucus membranes or skin necrosis: Yes Has patient had a PCN reaction that required hospitalization No Has patient had a PCN reaction occurring within the last 10 years: Yes If all of the above answers are "NO", then may proceed with Cephalosporin use.    Chlorthalidone Rash   Sulfa Antibiotics Rash    Patient Care Team: Malva Limes, MD as PCP - General (Family Medicine) Lemar Livings, Merrily Pew, MD (General Surgery) Isla Pence,  OD as Consulting Physician (Optometry) Hooten, Illene Labrador, MD as Consulting Physician (Orthopedic Surgery) Jesusita Oka, MD (Dermatology) Jesusita Oka, MD as Referring Physician (Dermatology)   Medications: Outpatient Medications Prior to Visit  Medication Sig   acetaminophen (TYLENOL) 500 MG tablet Take 1,000 mg by mouth every 6 (six) hours as needed for moderate pain or headache.   amLODipine (NORVASC) 10 MG tablet TAKE 1 TABLET BY MOUTH ONCE DAILY   Cholecalciferol (VITAMIN D) 50 MCG (2000 UT) CAPS Take 2 capsules by mouth daily.   diclofenac Sodium (VOLTAREN) 1 % GEL Apply topically 4 (four) times daily.   loratadine (CLARITIN) 10 MG tablet Take 1 tablet (10 mg total) by mouth daily.   lovastatin (MEVACOR) 40 MG tablet TAKE 1 TABLET BY MOUTH AT BEDTIME   meclizine (ANTIVERT) 25 MG tablet Take 1 tablet (25 mg total) by mouth 3 (three) times daily as needed for dizziness.   nystatin (MYCOSTATIN/NYSTOP) powder Apply 1 application topically 3 (three) times daily.   ondansetron (ZOFRAN) 4 MG tablet Take 1 tablet (4 mg total) by mouth every 8 (eight) hours as needed for up to 10 doses for nausea or vomiting.   promethazine (PHENERGAN) 25 MG tablet Take 1 tablet (25 mg total) by mouth every 8 (eight) hours as needed for nausea or vomiting.   spironolactone (ALDACTONE) 50 MG tablet TAKE 1 TABLET BY MOUTH ONCE DAILY    tiZANidine (ZANAFLEX) 2 MG tablet TAKE 1 OR 2 TABLETS BY MOUTH AT BEDTIME FOR LEG CRAMPS   traMADol (ULTRAM) 50 MG tablet Take 1 tablet (50 mg total) by mouth every 6 (six) hours as needed.   traZODone (DESYREL) 50 MG tablet Take 1-2 tablets (50-100 mg total) by mouth at bedtime as needed for sleep.   venlafaxine XR (EFFEXOR-XR) 75 MG 24 hr capsule TAKE 1 CAPSULE BY MOUTH ONCE DAILY WITH BREAKFAST   No facility-administered medications prior to visit.    Review of Systems  Constitutional:  Negative for appetite change, chills, fatigue and fever.  Respiratory:  Negative for chest tightness and shortness of breath.   Cardiovascular:  Negative for chest pain and palpitations.  Gastrointestinal:  Negative for abdominal pain, nausea and vomiting.  Neurological:  Negative for dizziness and weakness.      Objective    BP 136/69 (BP Location: Left Arm, Patient Position: Sitting, Cuff Size: Normal)   Pulse 69   Ht 5\' 6"  (1.676 m)   Wt 183 lb 8 oz (83.2 kg)   SpO2 99%   BMI 29.62 kg/m    Physical Exam   General Appearance:    Well developed, well nourished female. Alert, cooperative, in no acute distress, appears stated age   Head:    Normocephalic, without obvious abnormality, atraumatic  Eyes:    PERRL, conjunctiva/corneas clear, EOM's intact, fundi    benign, both eyes  Ears:    Normal TM's and external ear canals, both ears  Nose:   Nares normal, septum midline, mucosa normal, no drainage    or sinus tenderness  Throat:   Lips, mucosa, and tongue normal; teeth and gums normal  Neck:   Supple, symmetrical, trachea midline, no adenopathy;    thyroid:  no enlargement/tenderness/nodules; no carotid   bruit or JVD  Back:     Symmetric, no curvature, ROM normal, no CVA tenderness  Lungs:     Clear to auscultation bilaterally, respirations unlabored  Chest Wall:    No tenderness or deformity   Heart:    Normal heart rate.  Normal rhythm. No murmurs, rubs, or gallops.   Breast Exam:     deferred  Abdomen:     Soft, non-tender, bowel sounds active all four quadrants,    no masses, no organomegaly  Pelvic:    deferred  Extremities:   All extremities are intact. No cyanosis or edema  Pulses:   2+ and symmetric all extremities  Skin:   Skin color, texture, turgor normal, no rashes or lesions  Lymph nodes:   Cervical, supraclavicular, and axillary nodes normal  Neurologic:   CNII-XII intact, normal strength, sensation and reflexes    throughout     Last depression screening scores    05/24/2023    3:10 PM 02/26/2023    1:19 PM 09/18/2022    8:29 AM  PHQ 2/9 Scores  PHQ - 2 Score 0 0 0  PHQ- 9 Score  1 1   Last fall risk screening    05/24/2023    3:02 PM  Fall Risk   Falls in the past year? 0  Number falls in past yr: 0  Injury with Fall? 0  Risk for fall due to : No Fall Risks  Follow up Education provided;Falls prevention discussed   Last Audit-C alcohol use screening    05/24/2023    3:09 PM  Alcohol Use Disorder Test (AUDIT)  1. How often do you have a drink containing alcohol? 0  2. How many drinks containing alcohol do you have on a typical day when you are drinking? 0  3. How often do you have six or more drinks on one occasion? 0  AUDIT-C Score 0   A score of 3 or more in women, and 4 or more in men indicates increased risk for alcohol abuse, EXCEPT if all of the points are from question 1    Assessment & Plan    Routine Health Maintenance and Physical Exam  Exercise Activities and Dietary recommendations  Goals      DIET - REDUCE SUGAR INTAKE     Recommend to cut back on sweets and dessert in diet to help aid with weight loss.         Immunization History  Administered Date(s) Administered   Fluad Quad(high Dose 65+) 10/24/2019, 08/14/2020, 09/03/2021, 09/18/2022   Influenza, High Dose Seasonal PF 12/21/2016, 10/04/2017, 08/22/2018   PFIZER(Purple Top)SARS-COV-2 Vaccination 12/21/2019, 01/15/2020, 09/13/2020   Pneumococcal  Conjugate-13 11/12/2014   Pneumococcal Polysaccharide-23 01/11/2012   Tdap 04/06/2007   Zoster Recombinant(Shingrix) 12/11/2021, 02/08/2022    Health Maintenance  Topic Date Due   DTaP/Tdap/Td (2 - Td or Tdap) 04/05/2017   COVID-19 Vaccine (4 - 2023-24 season) 07/31/2022   INFLUENZA VACCINE  07/01/2023   MAMMOGRAM  09/18/2023   Colonoscopy  11/21/2023   Medicare Annual Wellness (AWV)  05/23/2024   DEXA SCAN  05/23/2025   Pneumonia Vaccine 76+ Years old  Completed   Hepatitis C Screening  Completed   Zoster Vaccines- Shingrix  Completed   HPV VACCINES  Aged Out    Discussed health benefits of physical activity, and encouraged her to engage in regular exercise appropriate for her age and condition.   2. Vitamin D deficiency - VITAMIN D 25 Hydroxy (Vit-D Deficiency, Fractures)  3. Cerebrovascular disease Asymptomatic. Compliant with medication.  Continue aggressive risk factor modification.   - Lipid panel - EKG 12-Lead  4. Primary hypertension Well controlled.  Continue current medications.    5. Stage 3 chronic kidney disease, unspecified whether stage 3a or 3b CKD (HCC) Followed  by Dr. Thedore Mins  6. Aortic atherosclerosis (HCC) Continue statin  7. Prescription for Tdap. Vaccine not administered in office.   - Tdap (BOOSTRIX) 5-2.5-18.5 LF-MCG/0.5 injection; Inject 0.5 mLs into the muscle once for 1 dose.  Dispense: 0.5 mL; Refill: 0        Mila Merry, MD  [facility] [phone] (phone) [phone] (fax)  [facility]

## 2023-07-10 NOTE — Telephone Encounter (Signed)
err

## 2023-07-28 ENCOUNTER — Other Ambulatory Visit: Payer: Self-pay | Admitting: Family Medicine

## 2023-07-28 DIAGNOSIS — F419 Anxiety disorder, unspecified: Secondary | ICD-10-CM

## 2023-08-04 ENCOUNTER — Other Ambulatory Visit: Payer: Self-pay | Admitting: Family Medicine

## 2023-08-04 DIAGNOSIS — Z1231 Encounter for screening mammogram for malignant neoplasm of breast: Secondary | ICD-10-CM

## 2023-08-12 ENCOUNTER — Other Ambulatory Visit: Payer: Self-pay | Admitting: Family Medicine

## 2023-08-12 DIAGNOSIS — I10 Essential (primary) hypertension: Secondary | ICD-10-CM

## 2023-08-13 NOTE — Telephone Encounter (Signed)
Requested Prescriptions  Pending Prescriptions Disp Refills   amLODipine (NORVASC) 10 MG tablet [Pharmacy Med Name: AMLODIPINE BESYLATE 10 MG TAB] 90 tablet 0    Sig: TAKE 1 TABLET BY MOUTH ONCE DAILY     Cardiovascular: Calcium Channel Blockers 2 Passed - 08/12/2023  2:51 PM      Passed - Last BP in normal range    BP Readings from Last 1 Encounters:  07/05/23 136/69         Passed - Last Heart Rate in normal range    Pulse Readings from Last 1 Encounters:  07/05/23 69         Passed - Valid encounter within last 6 months    Recent Outpatient Visits           1 month ago Annual physical exam   [facility] Malva Limes, MD   5 months ago Primary hypertension   [facility] [facility] Malva Limes, MD   10 months ago Acute non-recurrent frontal sinusitis   [facility] Alfredia Ferguson, PA-C   10 months ago Primary hypertension   [facility] Malva Limes, MD   1 year ago Annual physical exam   [facility] Malva Limes, MD       Future Appointments             In 10 months Fisher, Demetrios Isaacs, MD [facility]

## 2023-09-08 ENCOUNTER — Other Ambulatory Visit: Payer: Self-pay | Admitting: Family Medicine

## 2023-09-08 NOTE — Telephone Encounter (Signed)
Requested Prescriptions  Pending Prescriptions Disp Refills   lovastatin (MEVACOR) 40 MG tablet [Pharmacy Med Name: LOVASTATIN 40 MG TAB] 90 tablet 1    Sig: TAKE 1 TABLET BY MOUTH AT BEDTIME     Cardiovascular:  Antilipid - Statins 2 Failed - 09/08/2023 12:28 PM      Failed - Cr in normal range and within 360 days    Creatinine  Date Value Ref Range Status  05/04/2013 1.12 0.60 - 1.30 mg/dL Final   Creatinine, Ser  Date Value Ref Range Status  02/26/2023 1.28 (H) 0.57 - 1.00 mg/dL Final         Failed - Lipid Panel in normal range within the last 12 months    Cholesterol, Total  Date Value Ref Range Status  07/05/2023 178 100 - 199 mg/dL Final   LDL Chol Calc (NIH)  Date Value Ref Range Status  07/05/2023 104 (H) 0 - 99 mg/dL Final   HDL  Date Value Ref Range Status  07/05/2023 46 >39 mg/dL Final   Triglycerides  Date Value Ref Range Status  07/05/2023 158 (H) 0 - 149 mg/dL Final         Passed - Patient is not pregnant      Passed - Valid encounter within last 12 months    Recent Outpatient Visits           2 months ago Annual physical exam   [facility] Malva Limes, MD   6 months ago Primary hypertension   [facility] Malva Limes, MD   11 months ago Acute non-recurrent frontal sinusitis   [facility] Alfredia Ferguson, PA-C   11 months ago Primary hypertension   [facility] Malva Limes, MD   1 year ago Annual physical exam   [facility] Malva Limes, MD       Future Appointments             In 10 months Fisher, Demetrios Isaacs, MD [facility]

## 2023-09-22 ENCOUNTER — Ambulatory Visit
Admission: RE | Admit: 2023-09-22 | Discharge: 2023-09-22 | Disposition: A | Discharge status: Home / Self Care | Payer: Medicare Other | Source: Ambulatory Visit | Attending: Family Medicine | Admitting: Family Medicine

## 2023-09-22 DIAGNOSIS — Z1231 Encounter for screening mammogram for malignant neoplasm of breast: Secondary | ICD-10-CM

## 2023-09-23 ENCOUNTER — Other Ambulatory Visit: Payer: Self-pay | Admitting: Family Medicine

## 2023-09-23 DIAGNOSIS — I10 Essential (primary) hypertension: Secondary | ICD-10-CM

## 2023-11-01 ENCOUNTER — Other Ambulatory Visit: Payer: Self-pay | Admitting: Family Medicine

## 2023-11-01 DIAGNOSIS — F419 Anxiety disorder, unspecified: Secondary | ICD-10-CM

## 2023-11-02 ENCOUNTER — Other Ambulatory Visit: Payer: Self-pay | Admitting: Family Medicine

## 2023-11-02 DIAGNOSIS — F419 Anxiety disorder, unspecified: Secondary | ICD-10-CM

## 2023-11-02 NOTE — Telephone Encounter (Signed)
Medication Refill -  Most Recent Primary Care Visit:  Provider: Malva Limes  Department: [id number]  Visit Type: PHYSICAL  Date: 07/05/2023  Medication: venlafaxine XR (EFFEXOR-XR) 75 MG 24 hr capsule [Pharmacy Med Name: VENLAFAXINE HCL ER 75 MG CAP]   Has the patient contacted their pharmacy? Yes  (Agent: If yes, when and what did the pharmacy advise?)  Is this the correct pharmacy for this prescription? Yes If no, delete pharmacy and type the correct one.  This is the patient's preferred pharmacy:   [facility] - [place], [place] - [address] [place] [address] Phone: [phone] Fax: [phone]   Has the prescription been filled recently? Yes  Is the patient out of the medication? Yes  Has the patient been seen for an appointment in the last year OR does the patient have an upcoming appointment? Yes  Can we respond through MyChart? Yes  Agent: Please be advised that Rx refills may take up to 3 business days. We ask that you follow-up with your pharmacy.

## 2023-11-02 NOTE — Telephone Encounter (Signed)
Requested 11/01/23 by the pharmacy in a separate refill encounter.

## 2023-11-03 DIAGNOSIS — I129 Hypertensive chronic kidney disease with stage 1 through stage 4 chronic kidney disease, or unspecified chronic kidney disease: Secondary | ICD-10-CM | POA: Diagnosis not present

## 2023-11-03 DIAGNOSIS — N200 Calculus of kidney: Secondary | ICD-10-CM | POA: Diagnosis not present

## 2023-11-03 DIAGNOSIS — I10 Essential (primary) hypertension: Secondary | ICD-10-CM | POA: Diagnosis not present

## 2023-11-03 DIAGNOSIS — N1831 Chronic kidney disease, stage 3a: Secondary | ICD-10-CM | POA: Diagnosis not present

## 2023-11-04 DIAGNOSIS — J00 Acute nasopharyngitis [common cold]: Secondary | ICD-10-CM | POA: Diagnosis not present

## 2023-11-07 ENCOUNTER — Ambulatory Visit
Admission: EM | Admit: 2023-11-07 | Discharge: 2023-11-07 | Disposition: A | Discharge status: Home / Self Care | Payer: Medicare Other | Attending: Physician Assistant | Admitting: Physician Assistant

## 2023-11-07 ENCOUNTER — Ambulatory Visit: Payer: Medicare Other

## 2023-11-07 ENCOUNTER — Encounter: Payer: Self-pay | Admitting: Emergency Medicine

## 2023-11-07 DIAGNOSIS — B349 Viral infection, unspecified: Secondary | ICD-10-CM

## 2023-11-07 DIAGNOSIS — R051 Acute cough: Secondary | ICD-10-CM | POA: Diagnosis not present

## 2023-11-07 DIAGNOSIS — R5383 Other fatigue: Secondary | ICD-10-CM | POA: Diagnosis not present

## 2023-11-07 DIAGNOSIS — R059 Cough, unspecified: Secondary | ICD-10-CM | POA: Diagnosis not present

## 2023-11-07 LAB — SARS CORONAVIRUS 2 BY RT PCR: SARS Coronavirus 2 by RT PCR: NEGATIVE

## 2023-11-07 MED ORDER — PROMETHAZINE-DM 6.25-15 MG/5ML PO SYRP: 5.0000 mL | ORAL_SOLUTION | Freq: Four times a day (QID) | ORAL | 0 refills | Status: DC | PRN

## 2023-11-07 NOTE — ED Provider Notes (Signed)
[facility]    CSN: [id number] Arrival date & time: 11/07/23  7846      History   Chief Complaint Chief Complaint  Patient presents with   Cough   Generalized Body Aches    HPI Lindsay Russo is a 77 y.o. female presenting for fatigue, cough, body aches and congestion x 4 days.  She reports the cough is dry.  Denies fever, sore throat, chest pain or breathing difficulty.  [facility] and had negative flu testing.  Was not tested for COVID.  No sick contacts.  Taking OTC meds and benzonatate as prescribed by [facility].  No other complaints.  HPI  Past Medical History:  Diagnosis Date   Anemia    Breast cancer (HCC) 2014   left breast LCIS   History of kidney stones    Hypertension    LCIS September 05, 2013   Left breast, ER 90%, PR 60%   Lobular carcinoma in situ 09/26/2013   Malignant neoplasm of ascending colon [facility] November 2007   T2, N0.5.2 cm low-grade adenocarcinoma. 0/28 nodes positive.   Personal history of colonic polyps    Spinal headache 1995   at [facility] spinal made me feel dead, had no control.   Stage 3 chronic kidney disease (HCC) 02/10/2021   Stroke (HCC) 09/17/2010   Right thalamic without residual effects   Tubulovillous adenoma polyp of colon 10/06/2017   2 polyps, 5 and 10 mm completely resected from the area of the ileocolic anastomosis.  Tubulovillous polyp with high-grade dysplasia.    Patient Active Problem List   Diagnosis Date Noted   Anxiety 02/13/2022   Carotid artery plaque, bilateral 06/04/2021   Vitamin D deficiency 02/11/2021   Other insomnia 02/10/2021   Leukocytosis 02/10/2021   Leg cramps 02/10/2021   Stage 3 chronic kidney disease (HCC) 02/10/2021   Aortic atherosclerosis (HCC) 01/06/2019   Rash due to allergy 08/08/2018   Restless leg syndrome 08/08/2018   Breast calcification, left 08/16/2017   S/P total knee arthroplasty 10/26/2016   Allergic rhinitis 11/21/2015   Personal history of urinary  calculi 11/21/2015   History of migraine 11/21/2015   Arthritis 11/21/2015   Vertigo 05/13/2015   Senile purpura (HCC) 05/13/2015   Cerebrovascular disease 03/26/2015   Degenerative disc disease, lumbar 03/26/2015   Obesity 03/26/2015   Hyperlipidemia, mixed 03/26/2015   Hypertension 03/26/2015   Pre-diabetes 03/26/2015   History of left breast cancer 08/21/2014   Breast neoplasm, Tis (LCIS) 09/12/2013   Breast microcalcification, mammographic 07/26/2013   History of CVA (cerebrovascular accident) 09/17/2010   History of colon cancer 09/30/2006    Past Surgical History:  Procedure Laterality Date   APPENDECTOMY  1995   BREAST BIOPSY Right August 07, 2010   Sclerotic fibroadenoma removed from the right breast at the 10:00 position   BREAST BIOPSY Left 08/14/2013   left breast superior LCIS. per path report: Pleomorphic LCIS has similar behavior to DCIS.   BREAST BIOPSY Left 08/14/2013   left breast superior fibroadenoma   BREAST BIOPSY Left 08/23/2017   stereo bx with Dr. Lemar Livings of lower inner quad. FIBROADENOMATOID STROMAL FIBROSIS WITH ASSOCIATED CALCIFICATIONS.    BREAST BIOPSY     BREAST LUMPECTOMY Left 09/05/2013   LCIS, clear margins   BREAST SURGERY Left 09/05/13   LCIS, ER 90%, PR 60%, negative margins.   COLONOSCOPY  August 03, 2012   8 mm tubulovillous adenoma removed from the transverse colon. Followup exam in  2018 plan.   COLONOSCOPY WITH PROPOFOL N/A 10/06/2017   Procedure: COLONOSCOPY WITH PROPOFOL;  Surgeon: [name], MD;  Location: [facility];  Service: Endoscopy;  Laterality: N/A;   COLONOSCOPY WITH PROPOFOL N/A 11/20/2020   Procedure: COLONOSCOPY WITH PROPOFOL;  Surgeon: [name], MD;  Location: [facility];  Service: Endoscopy;  Laterality: N/A;   DOPPLER ECHOCARDIOGRAPHY  09/17/2010   EF 55%, Mild to moderate MR and mild to moderate TR   HEEL SPUR EXCISION Left 2005   JOINT REPLACEMENT Right 2012   KNEE ARTHROPLASTY Left  10/26/2016   Procedure: COMPUTER ASSISTED TOTAL KNEE ARTHROPLASTY;  Surgeon: Donato Heinz, MD;  Location: [facility];  Service: Orthopedics;  Laterality: Left;   LUMBAR DISC SURGERY  2009   L4-5 microdiscectomy Dr. Gerrit Heck   OVARIAN CYST REMOVAL  1980   PARTIAL COLECTOMY Right 2007   Dr. Lemar Livings   REPLACEMENT TOTAL KNEE Right 12/2010    OB History     Gravida  2   Para  2   Term      Preterm      AB      Living  2      SAB      IAB      Ectopic      Multiple      Live Births           Obstetric Comments  1st Menstrual Cycle:  12 1st Pregnancy:  21          Home Medications    Prior to Admission medications   Medication Sig Start Date End Date Taking? Authorizing Provider  benzonatate (TESSALON) 100 MG capsule Swallow whole one (100mg ) capsule by mouth 3 times a day  as needed.Do not break, chew, dissolve, cut or crush. 11/04/23 11/11/23 Yes [provider]  promethazine-dextromethorphan (PROMETHAZINE-DM) 6.25-15 MG/5ML syrup Take 5 mLs by mouth 4 (four) times daily as needed. 11/07/23  Yes Shirlee Latch, PA-C  acetaminophen (TYLENOL) 500 MG tablet Take 1,000 mg by mouth every 6 (six) hours as needed for moderate pain or headache.    [provider]  amLODipine (NORVASC) 10 MG tablet TAKE 1 TABLET BY MOUTH ONCE DAILY 08/13/23   Malva Limes, MD  Cholecalciferol (VITAMIN D) 50 MCG (2000 UT) CAPS Take 2 capsules by mouth daily.    [provider]  diclofenac Sodium (VOLTAREN) 1 % GEL Apply topically 4 (four) times daily.    [provider]  loratadine (CLARITIN) 10 MG tablet Take 1 tablet (10 mg total) by mouth daily. 10/09/22   Alfredia Ferguson, PA-C  lovastatin (MEVACOR) 40 MG tablet TAKE 1 TABLET BY MOUTH AT BEDTIME 09/08/23   Malva Limes, MD  meclizine (ANTIVERT) 25 MG tablet Take 1 tablet (25 mg total) by mouth 3 (three) times daily as needed for dizziness. 04/30/20   Malva Limes, MD  nystatin  (MYCOSTATIN/NYSTOP) powder Apply 1 application topically 3 (three) times daily. 06/24/20   Margaretann Loveless, PA-C  ondansetron (ZOFRAN) 4 MG tablet Take 1 tablet (4 mg total) by mouth every 8 (eight) hours as needed for up to 10 doses for nausea or vomiting. 09/24/21   Gilles Chiquito, MD  promethazine (PHENERGAN) 25 MG tablet Take 1 tablet (25 mg total) by mouth every 8 (eight) hours as needed for nausea or vomiting. 04/30/20   Malva Limes, MD  spironolactone (ALDACTONE) 50 MG tablet TAKE 1 TABLET BY MOUTH ONCE DAILY 09/23/23  Malva Limes, MD  tiZANidine (ZANAFLEX) 2 MG tablet TAKE 1 OR 2 TABLETS BY MOUTH AT BEDTIME FOR LEG CRAMPS 05/30/21   Malva Limes, MD  traMADol (ULTRAM) 50 MG tablet Take 1 tablet (50 mg total) by mouth every 6 (six) hours as needed. 09/30/21   Malva Limes, MD  traZODone (DESYREL) 50 MG tablet Take 1-2 tablets (50-100 mg total) by mouth at bedtime as needed for sleep. 01/14/21   Malva Limes, MD  venlafaxine XR (EFFEXOR-XR) 75 MG 24 hr capsule TAKE 1 CAPSULE BY MOUTH ONCE DAILY WITH BREAKFAST 11/03/23   Malva Limes, MD    Family History Family History  Problem Relation Age of Onset   Gastric cancer Father    Brain cancer Father    Hypertension Sister    Heart disease Other    Ulcers Other    Epilepsy Other     Social History Social History   Tobacco Use   Smoking status: Never   Smokeless tobacco: Never  Vaping Use   Vaping status: Never Used  Substance Use Topics   Alcohol use: No   Drug use: No     Allergies   Tegaderm ag mesh [silver], Amoxicillin, Chlorthalidone, and Sulfa antibiotics   Review of Systems Review of Systems  Constitutional:  Positive for fatigue. Negative for chills, diaphoresis and fever.  HENT:  Positive for congestion and rhinorrhea. Negative for ear pain, sinus pressure, sinus pain and sore throat.   Respiratory:  Positive for cough. Negative for shortness of breath.   Gastrointestinal:  Negative for  abdominal pain, nausea and vomiting.  Musculoskeletal:  Negative for arthralgias and myalgias.  Skin:  Negative for rash.  Neurological:  Negative for weakness and headaches.  Hematological:  Negative for adenopathy.     Physical Exam Triage Vital Signs ED Triage Vitals  Encounter Vitals Group     BP 11/07/23 0932 137/85     Systolic BP Percentile --      Diastolic BP Percentile --      Pulse Rate 11/07/23 0932 82     Resp 11/07/23 0932 15     Temp 11/07/23 0932 99 F (37.2 C)     Temp Source 11/07/23 0932 Oral     SpO2 11/07/23 0932 95 %     Weight 11/07/23 0930 183 lb 6.8 oz (83.2 kg)     Height 11/07/23 0930 5\' 6"  (1.676 m)     Head Circumference --      Peak Flow --      Pain Score 11/07/23 0930 0     Pain Loc --      Pain Education --      Exclude from Growth Chart --    No data found.  Updated Vital Signs BP 137/85 (BP Location: Left Arm)   Pulse 82   Temp 99 F (37.2 C) (Oral)   Resp 15   Ht 5\' 6"  (1.676 m)   Wt 183 lb 6.8 oz (83.2 kg)   SpO2 95%   BMI 29.61 kg/m     Physical Exam Vitals and nursing note reviewed.  Constitutional:      General: She is not in acute distress.    Appearance: Normal appearance. She is not ill-appearing or toxic-appearing.  HENT:     Head: Normocephalic and atraumatic.     Nose: Congestion present.     Mouth/Throat:     Mouth: Mucous membranes are moist.     Pharynx: Oropharynx is clear.  Eyes:     General: No scleral icterus.       Right eye: No discharge.        Left eye: No discharge.     Conjunctiva/sclera: Conjunctivae normal.  Cardiovascular:     Rate and Rhythm: Normal rate and regular rhythm.     Heart sounds: Normal heart sounds.  Pulmonary:     Effort: Pulmonary effort is normal. No respiratory distress.     Breath sounds: Normal breath sounds. No wheezing, rhonchi or rales.  Musculoskeletal:     Cervical back: Neck supple.  Skin:    General: Skin is dry.  Neurological:     General: No focal deficit  present.     Mental Status: She is alert. Mental status is at baseline.     Motor: No weakness.     Gait: Gait normal.  Psychiatric:        Mood and Affect: Mood normal.        Behavior: Behavior normal.      UC Treatments / Results  Labs (all labs ordered are listed, but only abnormal results are displayed) Labs Reviewed  SARS CORONAVIRUS 2 BY RT PCR    EKG   Radiology DG Chest 2 View  Result Date: 11/07/2023 CLINICAL DATA:  Coughing chest congestion for 1 week.  Body aches. EXAM: CHEST - 2 VIEW COMPARISON:  09/24/2021 FINDINGS: Midline trachea. Normal heart size and mediastinal contours. No pleural effusion or pneumothorax. Clear lungs. IMPRESSION: No active cardiopulmonary disease. Electronically Signed   By: Jeronimo Greaves M.D.   On: 11/07/2023 09:52    Procedures Procedures (including critical care time)  Medications Ordered in UC Medications - No data to display  Initial Impression / Assessment and Plan / UC Course  I have reviewed the triage vital signs and the nursing notes.  Pertinent labs & imaging results that were available during my care of the patient were reviewed by me and considered in my medical decision making (see chart for details).   77 year old female presents for 4-day history of fatigue, cough, congestion.  [facility] and had negative flu testing.  Vitals are normal and stable.  She is overall well-appearing.  Complains of significant fatigue.  Mild nasal congestion.  Throat clear.  Chest clear to auscultation.  Chest x-ray performed given patient's complaints of cough and extreme fatigue.  Chest x-ray negative on wet read as determined by myself.  An overread was performed which was negative.  COVID testing obtained.  Reviewed current CDC guidance, isolation protocol and ED precautions.  COVID testing negative.  Result communicated to patient through MyChart.  Viral illness.  Support care encouraged with increasing rest  and fluids.  I sent Promethazine DM for cough.  Acute illness with systemic symptoms.   Final Clinical Impressions(s) / UC Diagnoses   Final diagnoses:  Viral illness  Acute cough  Other fatigue     Discharge Instructions      -Will call if COVID is positive.  If positive you need to isolate for a few more days until you are feeling better.  If negative then you have another viral illness. -Normal chest x-ray.  No pneumonia. -Return if fever, worsening cough or shortness of breath     ED Prescriptions     Medication Sig Dispense Auth. Provider   promethazine-dextromethorphan (PROMETHAZINE-DM) 6.25-15 MG/5ML syrup Take 5 mLs by mouth 4 (four) times daily as needed. 118 mL Shirlee Latch, PA-C  PDMP not reviewed this encounter.   Shirlee Latch, PA-C 11/07/23 1040

## 2023-11-07 NOTE — Discharge Instructions (Addendum)
-  Will call if COVID is positive.  If positive you need to isolate for a few more days until you are feeling better.  If negative then you have another viral illness. -Normal chest x-ray.  No pneumonia. -Return if fever, worsening cough or shortness of breath

## 2023-11-07 NOTE — ED Triage Notes (Signed)
Patient c/o cough, bodyaches, and chest congestion that started a week ago.  Patient unsure of fevers.  [facility] Thursday and was given cough medicine.

## 2023-11-10 ENCOUNTER — Ambulatory Visit: Payer: Medicare Other | Admitting: Family Medicine

## 2023-11-19 ENCOUNTER — Ambulatory Visit (INDEPENDENT_AMBULATORY_CARE_PROVIDER_SITE_OTHER): Payer: Medicare Other | Admitting: Family Medicine

## 2023-11-19 VITALS — BP 140/70 | HR 69 | Resp 18 | Ht 66.0 in | Wt 178.0 lb

## 2023-11-19 DIAGNOSIS — F419 Anxiety disorder, unspecified: Secondary | ICD-10-CM

## 2023-11-19 DIAGNOSIS — R5383 Other fatigue: Secondary | ICD-10-CM | POA: Diagnosis not present

## 2023-11-19 DIAGNOSIS — I10 Essential (primary) hypertension: Secondary | ICD-10-CM

## 2023-11-19 DIAGNOSIS — E559 Vitamin D deficiency, unspecified: Secondary | ICD-10-CM | POA: Diagnosis not present

## 2023-11-19 DIAGNOSIS — N183 Chronic kidney disease, stage 3 unspecified: Secondary | ICD-10-CM | POA: Diagnosis not present

## 2023-11-19 MED ORDER — AMLODIPINE BESYLATE 10 MG PO TABS: 10.0000 mg | ORAL_TABLET | Freq: Every day | ORAL | 4 refills | Status: DC

## 2023-11-19 NOTE — Patient Instructions (Addendum)
Please review the attached list of medications and notify my office if there are any errors.   We will change the dose of venlafaxine to 37.5mg  with your next refill

## 2023-11-19 NOTE — Progress Notes (Signed)
Established patient visit   Patient: Lindsay Russo   DOB: [birth year]   77 y.o. Female  MRN: [id number] Visit Date: 11/19/2023  Today's healthcare provider: Mila Merry, MD   Chief Complaint  Patient presents with   Medical Management of Chronic Issues   Subjective    HPI  Htn, anxiety, CKD. Had uri last week went to urgent care, given cough syrup. Feels fatigued and poor appeitie but much better compared to last week. On vitamin d since last labs. Dr. Thedore Mins 12-4, labs stable.  Lab Results  Component Value Date   VD25OH 26.3 (L) 07/05/2023   Lab Results  Component Value Date   TSH 2.900 05/22/2022   Lab Results  Component Value Date   NA 142 02/26/2023   K 4.9 02/26/2023   CREATININE 1.28 (H) 02/26/2023   EGFR 43 (L) 02/26/2023   GLUCOSE 116 (H) 02/26/2023   Lab Results  Component Value Date   CHOL 178 07/05/2023   HDL 46 07/05/2023   LDLCALC 104 (H) 07/05/2023   TRIG 158 (H) 07/05/2023   CHOLHDL 3.9 07/05/2023    No problems with mood. Doesn't think she needs venlafaxine. Was increase from 37.5 to 75  2 years ago.   Medications: Outpatient Medications Prior to Visit  Medication Sig   acetaminophen (TYLENOL) 500 MG tablet Take 1,000 mg by mouth every 6 (six) hours as needed for moderate pain or headache.   amLODipine (NORVASC) 10 MG tablet TAKE 1 TABLET BY MOUTH ONCE DAILY   Cholecalciferol (VITAMIN D) 50 MCG (2000 UT) CAPS Take 2 capsules by mouth daily.   diclofenac Sodium (VOLTAREN) 1 % GEL Apply topically 4 (four) times daily.   loratadine (CLARITIN) 10 MG tablet Take 1 tablet (10 mg total) by mouth daily.   lovastatin (MEVACOR) 40 MG tablet TAKE 1 TABLET BY MOUTH AT BEDTIME   meclizine (ANTIVERT) 25 MG tablet Take 1 tablet (25 mg total) by mouth 3 (three) times daily as needed for dizziness.   spironolactone (ALDACTONE) 50 MG tablet TAKE 1 TABLET BY MOUTH ONCE DAILY   venlafaxine XR (EFFEXOR-XR) 75 MG 24 hr capsule TAKE 1 CAPSULE BY MOUTH ONCE DAILY  WITH BREAKFAST   [DISCONTINUED] nystatin (MYCOSTATIN/NYSTOP) powder Apply 1 application topically 3 (three) times daily. (Patient not taking: Reported on 11/19/2023)   [DISCONTINUED] ondansetron (ZOFRAN) 4 MG tablet Take 1 tablet (4 mg total) by mouth every 8 (eight) hours as needed for up to 10 doses for nausea or vomiting. (Patient not taking: Reported on 11/19/2023)   [DISCONTINUED] promethazine (PHENERGAN) 25 MG tablet Take 1 tablet (25 mg total) by mouth every 8 (eight) hours as needed for nausea or vomiting. (Patient not taking: Reported on 11/19/2023)   [DISCONTINUED] promethazine-dextromethorphan (PROMETHAZINE-DM) 6.25-15 MG/5ML syrup Take 5 mLs by mouth 4 (four) times daily as needed. (Patient not taking: Reported on 11/19/2023)   [DISCONTINUED] tiZANidine (ZANAFLEX) 2 MG tablet TAKE 1 OR 2 TABLETS BY MOUTH AT BEDTIME FOR LEG CRAMPS   [DISCONTINUED] traMADol (ULTRAM) 50 MG tablet Take 1 tablet (50 mg total) by mouth every 6 (six) hours as needed.   [DISCONTINUED] traZODone (DESYREL) 50 MG tablet Take 1-2 tablets (50-100 mg total) by mouth at bedtime as needed for sleep.   No facility-administered medications prior to visit.    Review of Systems  Constitutional:  Positive for malaise/fatigue. Negative for weight loss.  Eyes:  Negative for blurred vision.  Respiratory: Negative.  Negative for cough, shortness of breath and wheezing.  Cardiovascular:  Negative for chest pain, palpitations, orthopnea, claudication, leg swelling and PND.  Neurological:  Negative for weakness and headaches.      Objective    BP (!) 140/70   Pulse 69   Resp 18   Ht 5\' 6"  (1.676 m)   Wt 178 lb (80.7 kg)   SpO2 95%   BMI 28.73 kg/m    Physical Exam   General: Appearance:    Well developed, well nourished female in no acute distress  Eyes:    PERRL, conjunctiva/corneas clear, EOM's intact       Lungs:     Clear to auscultation bilaterally, respirations unlabored  Heart:    Normal heart rate.  Normal rhythm. No murmurs, rubs, or gallops.    MS:   All extremities are intact.    Neurologic:   Awake, alert, oriented x 3. No apparent focal neurological defect.         Assessment & Plan     1. Primary hypertension (Primary) Fairly well controlled. Continue current medications.  RF amlodipine  2. Stage 3 chronic kidney disease, unspecified whether stage 3a or 3b CKD (HCC) Stable, followed occasionally by Dr. Thedore Mins  3. Vitamin D deficiency On supplements.   4. Other fatigue Starting with URI last week which has otherwise resolved. Call if not continuing to improved  5. Anxiety No sx currently. Will reduce venlafaxine to 37.5 with next refill at [facility].    Return in about 8 months (around 07/19/2024), or after august 5th, for Yearly Physical, Hypertension.         Mila Merry, MD  [facility] [phone] (phone) [phone] (fax)  [facility]

## 2023-12-15 ENCOUNTER — Other Ambulatory Visit: Payer: Self-pay | Admitting: Family Medicine

## 2023-12-15 DIAGNOSIS — I10 Essential (primary) hypertension: Secondary | ICD-10-CM

## 2023-12-15 DIAGNOSIS — F419 Anxiety disorder, unspecified: Secondary | ICD-10-CM

## 2023-12-15 MED ORDER — VENLAFAXINE HCL ER 37.5 MG PO CP24: 37.5000 mg | ORAL_CAPSULE | Freq: Every day | ORAL | 3 refills | Status: DC

## 2023-12-15 MED ORDER — SPIRONOLACTONE 50 MG PO TABS: 50.0000 mg | ORAL_TABLET | Freq: Every day | ORAL | 4 refills | Status: AC

## 2024-01-05 DIAGNOSIS — N1832 Chronic kidney disease, stage 3b: Secondary | ICD-10-CM | POA: Diagnosis not present

## 2024-01-05 DIAGNOSIS — N200 Calculus of kidney: Secondary | ICD-10-CM | POA: Diagnosis not present

## 2024-01-05 DIAGNOSIS — I129 Hypertensive chronic kidney disease with stage 1 through stage 4 chronic kidney disease, or unspecified chronic kidney disease: Secondary | ICD-10-CM | POA: Diagnosis not present

## 2024-01-05 DIAGNOSIS — I10 Essential (primary) hypertension: Secondary | ICD-10-CM | POA: Diagnosis not present

## 2024-01-24 ENCOUNTER — Other Ambulatory Visit: Payer: Self-pay | Admitting: Family Medicine

## 2024-01-25 NOTE — Telephone Encounter (Signed)
 Don't know why they are requesting 75. Please check with patient and see if she requested to go back up to 75, or if she wants to stay on 37.5

## 2024-01-25 NOTE — Telephone Encounter (Signed)
 Patient would like to stay on the 37.5 mg.

## 2024-02-01 ENCOUNTER — Ambulatory Visit
Admission: EM | Admit: 2024-02-01 | Discharge: 2024-02-01 | Disposition: A | Discharge status: Home / Self Care | Attending: Emergency Medicine | Admitting: Emergency Medicine

## 2024-02-01 DIAGNOSIS — U071 COVID-19: Secondary | ICD-10-CM | POA: Diagnosis not present

## 2024-02-01 LAB — POCT URINALYSIS DIP (MANUAL ENTRY)
Bilirubin, UA: NEGATIVE
Glucose, UA: NEGATIVE mg/dL
Ketones, POC UA: NEGATIVE mg/dL
Leukocytes, UA: NEGATIVE
Nitrite, UA: NEGATIVE
Protein Ur, POC: 100 mg/dL — AB
Spec Grav, UA: >=1.03 — AB
Urobilinogen, UA: 0.2 U/dL
pH, UA: 5.5

## 2024-02-01 NOTE — Discharge Instructions (Addendum)
Follow up with your primary care provider tomorrow.  Go to the emergency department if you have worsening symptoms.

## 2024-02-01 NOTE — ED Provider Notes (Signed)
 Renaldo Fiddler    CSN: [id number] Arrival date & time: 02/01/24  1848      History   Chief Complaint Chief Complaint  Patient presents with   Chills   Sore Throat   Fatigue    HPI Lindsay Russo is a 77 y.o. female.  Accompanied by her daughter, patient presents with 2-week history of runny nose, sneezing, sore throat, mild cough, fatigue.  Temp 100.5 today.  Patient's symptoms thought to be due to allergies until she developed a low-grade fever today which prompted her to test for COVID. Positive COVID test at home today.  Patient's daughter also reports patient had some confusion and chills today.  She tested her urine at home which indicated possible UTI.  Tylenol last taken at home this morning at 1100.  Patient denies chest pain, shortness of breath, abdominal pain, dysuria, hematuria.  The history is provided by the patient, a relative and medical records.    Past Medical History:  Diagnosis Date   Anemia    Breast cancer (HCC) 2014   left breast LCIS   History of kidney stones    Hypertension    LCIS September 05, 2013   Left breast, ER 90%, PR 60%   Lobular carcinoma in situ 09/26/2013   Malignant neoplasm of ascending colon [facility]) November 2007   T2, N0.5.2 cm low-grade adenocarcinoma. 0/28 nodes positive.   Personal history of colonic polyps    Spinal headache 1995   at [facility] spinal made me feel dead, had no control.   Stage 3 chronic kidney disease (HCC) 02/10/2021   Stroke (HCC) 09/17/2010   Right thalamic without residual effects   Tubulovillous adenoma polyp of colon 10/06/2017   2 polyps, 5 and 10 mm completely resected from the area of the ileocolic anastomosis.  Tubulovillous polyp with high-grade dysplasia.    Patient Active Problem List   Diagnosis Date Noted   Anxiety 02/13/2022   Carotid artery plaque, bilateral 06/04/2021   Vitamin D deficiency 02/11/2021   Other insomnia 02/10/2021   Leukocytosis 02/10/2021   Leg cramps 02/10/2021    Stage 3 chronic kidney disease (HCC) 02/10/2021   Aortic atherosclerosis (HCC) 01/06/2019   Rash due to allergy 08/08/2018   Restless leg syndrome 08/08/2018   Breast calcification, left 08/16/2017   S/P total knee arthroplasty 10/26/2016   Allergic rhinitis 11/21/2015   Personal history of urinary calculi 11/21/2015   History of migraine 11/21/2015   Arthritis 11/21/2015   Vertigo 05/13/2015   Senile purpura (HCC) 05/13/2015   Cerebrovascular disease 03/26/2015   Degenerative disc disease, lumbar 03/26/2015   Obesity 03/26/2015   Hyperlipidemia, mixed 03/26/2015   Hypertension 03/26/2015   Pre-diabetes 03/26/2015   History of left breast cancer 08/21/2014   Breast neoplasm, Tis (LCIS) 09/12/2013   Breast microcalcification, mammographic 07/26/2013   History of CVA (cerebrovascular accident) 09/17/2010   History of colon cancer 09/30/2006    Past Surgical History:  Procedure Laterality Date   APPENDECTOMY  1995   BREAST BIOPSY Right August 07, 2010   Sclerotic fibroadenoma removed from the right breast at the 10:00 position   BREAST BIOPSY Left 08/14/2013   left breast superior LCIS. per path report: Pleomorphic LCIS has similar behavior to DCIS.   BREAST BIOPSY Left 08/14/2013   left breast superior fibroadenoma   BREAST BIOPSY Left 08/23/2017   stereo bx with Dr. Lemar Livings of lower inner quad. FIBROADENOMATOID STROMAL FIBROSIS WITH ASSOCIATED CALCIFICATIONS.    BREAST BIOPSY  BREAST LUMPECTOMY Left 09/05/2013   LCIS, clear margins   BREAST SURGERY Left 09/05/13   LCIS, ER 90%, PR 60%, negative margins.   COLONOSCOPY  August 03, 2012   8 mm tubulovillous adenoma removed from the transverse colon. Followup exam in 2018 plan.   COLONOSCOPY WITH PROPOFOL N/A 10/06/2017   Procedure: COLONOSCOPY WITH PROPOFOL;  Surgeon: [name], MD;  Location: [facility] ENDOSCOPY;  Service: Endoscopy;  Laterality: N/A;   COLONOSCOPY WITH PROPOFOL N/A 11/20/2020   Procedure:  COLONOSCOPY WITH PROPOFOL;  Surgeon: [name], MD;  Location: [facility] ENDOSCOPY;  Service: Endoscopy;  Laterality: N/A;   DOPPLER ECHOCARDIOGRAPHY  09/17/2010   EF 55%, Mild to moderate MR and mild to moderate TR   HEEL SPUR EXCISION Left 2005   JOINT REPLACEMENT Right 2012   KNEE ARTHROPLASTY Left 10/26/2016   Procedure: COMPUTER ASSISTED TOTAL KNEE ARTHROPLASTY;  Surgeon: Donato Heinz, MD;  Location: [facility] ORS;  Service: Orthopedics;  Laterality: Left;   LUMBAR DISC SURGERY  2009   L4-5 microdiscectomy Dr. Gerrit Heck   OVARIAN CYST REMOVAL  1980   PARTIAL COLECTOMY Right 2007   Dr. Lemar Livings   REPLACEMENT TOTAL KNEE Right 12/2010    OB History     Gravida  2   Para  2   Term      Preterm      AB      Living  2      SAB      IAB      Ectopic      Multiple      Live Births           Obstetric Comments  1st Menstrual Cycle:  12 1st Pregnancy:  21          Home Medications    Prior to Admission medications   Medication Sig Start Date End Date Taking? Authorizing Provider  acetaminophen (TYLENOL) 500 MG tablet Take 1,000 mg by mouth every 6 (six) hours as needed for moderate pain or headache.    [provider]  amLODipine (NORVASC) 10 MG tablet Take 1 tablet (10 mg total) by mouth daily. 11/19/23   Malva Limes, MD  Cholecalciferol (VITAMIN D) 50 MCG (2000 UT) CAPS Take 2 capsules by mouth daily.    [provider]  diclofenac Sodium (VOLTAREN) 1 % GEL Apply topically 4 (four) times daily.    [provider]  loratadine (CLARITIN) 10 MG tablet Take 1 tablet (10 mg total) by mouth daily. 10/09/22   Alfredia Ferguson, PA-C  lovastatin (MEVACOR) 40 MG tablet TAKE 1 TABLET BY MOUTH AT BEDTIME 09/08/23   Malva Limes, MD  meclizine (ANTIVERT) 25 MG tablet Take 1 tablet (25 mg total) by mouth 3 (three) times daily as needed for dizziness. 04/30/20   Malva Limes, MD  spironolactone (ALDACTONE) 50 MG tablet Take 1 tablet (50 mg  total) by mouth daily. 12/15/23   Malva Limes, MD  venlafaxine XR (EFFEXOR-XR) 37.5 MG 24 hr capsule Take 1 capsule (37.5 mg total) by mouth daily with breakfast. 12/15/23   Malva Limes, MD    Family History Family History  Problem Relation Age of Onset   Gastric cancer Father    Brain cancer Father    Hypertension Sister    Heart disease Other    Ulcers Other    Epilepsy Other     Social History Social History   Tobacco Use   Smoking status: Never  Smokeless tobacco: Never  Vaping Use   Vaping status: Never Used  Substance Use Topics   Alcohol use: No   Drug use: No     Allergies   Tegaderm ag mesh [silver], Amoxicillin, Chlorthalidone, and Sulfa antibiotics   Review of Systems Review of Systems  Constitutional:  Positive for chills and fever.  HENT:  Positive for sneezing and sore throat. Negative for ear pain.   Respiratory:  Positive for cough. Negative for shortness of breath.   Cardiovascular:  Negative for chest pain and palpitations.  Gastrointestinal:  Negative for abdominal pain, diarrhea and vomiting.  Genitourinary:  Negative for dysuria, flank pain and hematuria.  Psychiatric/Behavioral:  Positive for confusion.      Physical Exam Triage Vital Signs ED Triage Vitals  Encounter Vitals Group     BP      Systolic BP Percentile      Diastolic BP Percentile      Pulse      Resp      Temp      Temp src      SpO2      Weight      Height      Head Circumference      Peak Flow      Pain Score      Pain Loc      Pain Education      Exclude from Growth Chart    No data found.  Updated Vital Signs BP 109/82 (BP Location: Left Arm)   Pulse 74   Temp 99.8 F (37.7 C) (Oral)   Resp 18   SpO2 95%   Visual Acuity Right Eye Distance:   Left Eye Distance:   Bilateral Distance:    Right Eye Near:   Left Eye Near:    Bilateral Near:     Physical Exam Constitutional:      General: She is not in acute distress. HENT:     Right  Ear: Tympanic membrane normal.     Left Ear: Tympanic membrane normal.     Nose: Rhinorrhea present.     Mouth/Throat:     Mouth: Mucous membranes are moist.     Pharynx: Oropharynx is clear.  Cardiovascular:     Rate and Rhythm: Normal rate and regular rhythm.     Heart sounds: Normal heart sounds.  Pulmonary:     Effort: Pulmonary effort is normal. No respiratory distress.     Breath sounds: Normal breath sounds.  Abdominal:     General: Bowel sounds are normal.     Palpations: Abdomen is soft.     Tenderness: There is no abdominal tenderness. There is no right CVA tenderness, left CVA tenderness, guarding or rebound.  Neurological:     General: No focal deficit present.     Mental Status: She is alert and oriented to person, place, and time.     Gait: Gait normal.     Comments: Patient is alert and oriented to person, place, time, situation.  Psychiatric:        Mood and Affect: Mood normal.        Behavior: Behavior normal.      UC Treatments / Results  Labs (all labs ordered are listed, but only abnormal results are displayed) Labs Reviewed  POCT URINALYSIS DIP (MANUAL ENTRY) - Abnormal; Notable for the following components:      Result Value   Spec Grav, UA >=1.030 (*)    Blood, UA trace-intact (*)  Protein Ur, POC =100 (*)    All other components within normal limits    EKG   Radiology No results found.  Procedures Procedures (including critical care time)  Medications Ordered in UC Medications - No data to display  Initial Impression / Assessment and Plan / UC Course  I have reviewed the triage vital signs and the nursing notes.  Pertinent labs & imaging results that were available during my care of the patient were reviewed by me and considered in my medical decision making (see chart for details).   COVID-19.  Patient tested positive for COVID at home today.  She has been symptomatic for 2 weeks.  Urine does not indicate infection at this time.   Patient is alert and oriented.  She is coherent and cooperative.  Afebrile and vital signs are stable.  Lungs are clear and O2 sat is 95% on room air.  Discussed symptomatic treatment including Tylenol as needed.  Instructed patient and her daughter to follow-up with her PCP tomorrow.  ED precautions given.  Education provided on COVID.  Patient and her daughter agree to plan of care.    Final Clinical Impressions(s) / UC Diagnoses   Final diagnoses:  COVID-19     Discharge Instructions      Follow up with your primary care provider tomorrow.  Go to the emergency department if you have worsening symptoms.        ED Prescriptions   None    PDMP not reviewed this encounter.   Mickie Bail, NP 02/01/24 [phone]

## 2024-02-01 NOTE — ED Triage Notes (Signed)
 Patient presents to UC accompanied by her daughter for UTI. Daughter states patient has had sore throat, runny nose, sneezing, fatigue x 2 weeks. She had a positive rapid COVID test today. Also concerned with UTI. Daughter states she has had some confusion, chills, and paleness today. She performed a rapid test and it was positive for UTI. Daughter brought her in for eval as her BP (ranging in the 150's-170's) and temp (100-101.0) have been elevated today.  Pt had a dose of tylenol today at 1100.    Patient denies fever, abdominal pain or urinary symptoms.

## 2024-02-02 ENCOUNTER — Ambulatory Visit: Payer: Self-pay

## 2024-04-25 ENCOUNTER — Other Ambulatory Visit: Payer: Self-pay | Admitting: Family Medicine

## 2024-04-25 DIAGNOSIS — F419 Anxiety disorder, unspecified: Secondary | ICD-10-CM

## 2024-05-08 ENCOUNTER — Other Ambulatory Visit: Payer: Self-pay | Admitting: Family Medicine

## 2024-05-29 ENCOUNTER — Ambulatory Visit (INDEPENDENT_AMBULATORY_CARE_PROVIDER_SITE_OTHER): Payer: Medicare Other

## 2024-05-29 VITALS — Ht 66.0 in | Wt 175.0 lb

## 2024-05-29 DIAGNOSIS — Z0000 Encounter for general adult medical examination without abnormal findings: Secondary | ICD-10-CM | POA: Diagnosis not present

## 2024-05-29 NOTE — Progress Notes (Signed)
 Because this visit was a virtual/telehealth visit,  certain criteria was not obtained, such a blood pressure, CBG if applicable, and timed get up and go. Any medications not marked as taking were not mentioned during the medication reconciliation part of the visit. Any vitals not documented were not able to be obtained due to this being a telehealth visit or patient was unable to self-report a recent blood pressure reading due to a lack of equipment at home via telehealth. Vitals that have been documented are verbally provided by the patient.   Subjective:   Lindsay Russo is a 78 y.o. who presents for a Medicare Wellness preventive visit.  As a reminder, Annual Wellness Visits don't include a physical exam, and some assessments may be limited, especially if this visit is performed virtually. We may recommend an in-person follow-up visit with your provider if needed.  Visit Complete: Virtual I connected with  Lindsay Russo on 05/29/24 by a audio enabled telemedicine application and verified that I am speaking with the correct person using two identifiers.  Patient Location: Home  Provider Location: Office/Clinic  I discussed the limitations of evaluation and management by telemedicine. The patient expressed understanding and agreed to proceed.  Vital Signs: Because this visit was a virtual/telehealth visit, some criteria may be missing or patient reported. Any vitals not documented were not able to be obtained and vitals that have been documented are patient reported.  VideoDeclined- This patient declined Librarian, academic. Therefore the visit was completed with audio only.  Persons Participating in Visit: Patient.  AWV Questionnaire: No: Patient Medicare AWV questionnaire was not completed prior to this visit.  Cardiac Risk Factors include: advanced age (>90men, >38 women);hypertension;family history of premature cardiovascular disease;dyslipidemia      Objective:    Today's Vitals   05/29/24 1504  Weight: 175 lb (79.4 kg)  Height: 5' 6 (1.676 m)  PainSc: 0-No pain   Body mass index is 28.25 kg/m.     05/29/2024    3:05 PM 11/07/2023    9:31 AM 05/24/2023    3:12 PM 09/24/2021   10:30 AM 09/24/2021   10:29 AM 11/20/2020    7:17 AM 04/30/2020    1:32 PM  Advanced Directives  Does Patient Have a Medical Advance Directive? No No No No Yes No No  Type of Advance Directive     Healthcare Power of Attorney    Does patient want to make changes to medical advance directive?    No - Patient declined No - Patient declined    Copy of Healthcare Power of Attorney in Chart?     No - copy requested    Would patient like information on creating a medical advance directive? No - Patient declined    No - Patient declined  No - Patient declined    Current Medications (verified) Outpatient Encounter Medications as of 05/29/2024  Medication Sig   acetaminophen  (TYLENOL ) 500 MG tablet Take 1,000 mg by mouth every 6 (six) hours as needed for moderate pain or headache.   amLODipine  (NORVASC ) 10 MG tablet Take 1 tablet (10 mg total) by mouth daily.   Cholecalciferol (VITAMIN D ) 50 MCG (2000 UT) CAPS Take 2 capsules by mouth daily.   diclofenac Sodium (VOLTAREN) 1 % GEL Apply topically 4 (four) times daily.   loratadine  (CLARITIN ) 10 MG tablet Take 1 tablet (10 mg total) by mouth daily.   lovastatin  (MEVACOR ) 40 MG tablet TAKE 1 TABLET BY MOUTH AT BEDTIME  meclizine  (ANTIVERT ) 25 MG tablet Take 1 tablet (25 mg total) by mouth 3 (three) times daily as needed for dizziness.   spironolactone  (ALDACTONE ) 50 MG tablet Take 1 tablet (50 mg total) by mouth daily.   venlafaxine  XR (EFFEXOR -XR) 37.5 MG 24 hr capsule TAKE 1 CAPSULE BY MOUTH ONCE DAILY WITH BREAKFAST   No facility-administered encounter medications on file as of 05/29/2024.    Allergies (verified) Tegaderm ag mesh [silver], Amoxicillin, Chlorthalidone , and Sulfa antibiotics   History: Past  Medical History:  Diagnosis Date   Anemia    Breast cancer (HCC) 2014   left breast LCIS   History of kidney stones    Hypertension    LCIS September 05, 2013   Left breast, ER 90%, PR 60%   Lobular carcinoma in situ 09/26/2013   Malignant neoplasm of ascending colon [facility]) November 2007   T2, N0.5.2 cm low-grade adenocarcinoma. 0/28 nodes positive.   Personal history of colonic polyps    Spinal headache 1995   at [facility] spinal made me feel dead, had no control.   Stage 3 chronic kidney disease (HCC) 02/10/2021   Stroke (HCC) 09/17/2010   Right thalamic without residual effects   Tubulovillous adenoma polyp of colon 10/06/2017   2 polyps, 5 and 10 mm completely resected from the area of the ileocolic anastomosis.  Tubulovillous polyp with high-grade dysplasia.   Past Surgical History:  Procedure Laterality Date   APPENDECTOMY  1995   BREAST BIOPSY Right August 07, 2010   Sclerotic fibroadenoma removed from the right breast at the 10:00 position   BREAST BIOPSY Left 08/14/2013   left breast superior LCIS. per path report: Pleomorphic LCIS has similar behavior to DCIS.   BREAST BIOPSY Left 08/14/2013   left breast superior fibroadenoma   BREAST BIOPSY Left 08/23/2017   stereo bx with Dr. Dessa of lower inner quad. FIBROADENOMATOID STROMAL FIBROSIS WITH ASSOCIATED CALCIFICATIONS.    BREAST BIOPSY     BREAST LUMPECTOMY Left 09/05/2013   LCIS, clear margins   BREAST SURGERY Left 09/05/13   LCIS, ER 90%, PR 60%, negative margins.   COLONOSCOPY  August 03, 2012   8 mm tubulovillous adenoma removed from the transverse colon. Followup exam in 2018 plan.   COLONOSCOPY WITH PROPOFOL  N/A 10/06/2017   Procedure: COLONOSCOPY WITH PROPOFOL ;  Surgeon: Dessa Reyes ORN, MD;  Location: [facility] ENDOSCOPY;  Service: Endoscopy;  Laterality: N/A;   COLONOSCOPY WITH PROPOFOL  N/A 11/20/2020   Procedure: COLONOSCOPY WITH PROPOFOL ;  Surgeon: Dessa Reyes ORN, MD;  Location: [facility] ENDOSCOPY;  Service:  Endoscopy;  Laterality: N/A;   DOPPLER ECHOCARDIOGRAPHY  09/17/2010   EF 55%, Mild to moderate MR and mild to moderate TR   HEEL SPUR EXCISION Left 2005   JOINT REPLACEMENT Right 2012   KNEE ARTHROPLASTY Left 10/26/2016   Procedure: COMPUTER ASSISTED TOTAL KNEE ARTHROPLASTY;  Surgeon: Lynwood SHAUNNA Hue, MD;  Location: [facility] ORS;  Service: Orthopedics;  Laterality: Left;   LUMBAR DISC SURGERY  2009   L4-5 microdiscectomy Dr. Kathi   OVARIAN CYST REMOVAL  1980   PARTIAL COLECTOMY Right 2007   Dr. Dessa   REPLACEMENT TOTAL KNEE Right 12/2010   Family History  Problem Relation Age of Onset   Gastric cancer Father    Brain cancer Father    Hypertension Sister    Heart disease Other    Ulcers Other    Epilepsy Other    Social History   Socioeconomic History   Marital status: Widowed  Spouse name: Not on file   Number of children: 2   Years of education: Not on file   Highest education level: Associate degree: academic program  Occupational History   Occupation: Retired  Tobacco Use   Smoking status: Never   Smokeless tobacco: Never  Vaping Use   Vaping status: Never Used  Substance and Sexual Activity   Alcohol use: No   Drug use: No   Sexual activity: Not on file  Other Topics Concern   Not on file  Social History Narrative   Not on file   Social Drivers of Health   Financial Resource Strain: Low Risk  (05/29/2024)   Overall Financial Resource Strain (CARDIA)    Difficulty of Paying Living Expenses: Not hard at all  Food Insecurity: No Food Insecurity (05/29/2024)   Hunger Vital Sign    Worried About Running Out of Food in the Last Year: Never true    Ran Out of Food in the Last Year: Never true  Transportation Needs: No Transportation Needs (05/29/2024)   PRAPARE - Administrator, Civil Service (Medical): No    Lack of Transportation (Non-Medical): No  Physical Activity: Sufficiently Active (05/29/2024)   Exercise Vital Sign    Days of Exercise per  Week: 5 days    Minutes of Exercise per Session: 30 min  Stress: No Stress Concern Present (05/29/2024)   [employer] of Occupational Health - Occupational Stress Questionnaire    Feeling of Stress: Not at all  Social Connections: Socially Isolated (05/29/2024)   Social Connection and Isolation Panel    Frequency of Communication with Friends and Family: More than three times a week    Frequency of Social Gatherings with Friends and Family: More than three times a week    Attends Religious Services: Never    Database administrator or Organizations: No    Attends Banker Meetings: Never    Marital Status: Widowed    Tobacco Counseling Counseling given: Not Answered    Clinical Intake:  Pre-visit preparation completed: Yes  Pain : No/denies pain Pain Score: 0-No pain     BMI - recorded: 28.25 Nutritional Status: BMI 25 -29 Overweight Nutritional Risks: None Diabetes: No  Lab Results  Component Value Date   HGBA1C 5.7 (H) 05/22/2022   HGBA1C 5.4 02/13/2022   HGBA1C 5.8 (H) 11/05/2021     How often do you need to have someone help you when you read instructions, pamphlets, or other written materials from your doctor or pharmacy?: 1 - Never What is the last grade level you completed in school?: Associate's Degree  Interpreter Needed?: No  Information entered by :: Clyde Upshaw N. Tajha Sammarco, LPN.   Activities of Daily Living     05/29/2024    3:08 PM  In your present state of health, do you have any difficulty performing the following activities:  Hearing? 0  Vision? 0  Difficulty concentrating or making decisions? 0  Comment BSE: reading, crossword puzzles  Walking or climbing stairs? 0  Dressing or bathing? 0  Doing errands, shopping? 0  Preparing Food and eating ? N  Using the Toilet? N  In the past six months, have you accidently leaked urine? N  Do you have problems with loss of bowel control? N  Managing your Medications? N  Managing your  Finances? N  Housekeeping or managing your Housekeeping? N    Patient Care Team: Gasper Nancyann BRAVO, MD as PCP - General (Family Medicine) [place],  Reyes ORN, MD (General Surgery) Mevelyn JONETTA Bathe, OD as Consulting Physician (Optometry) Hooten, Lynwood SQUIBB, MD as Consulting Physician (Orthopedic Surgery) Cathlyn Seal, MD (Dermatology) Cathlyn Seal, MD as Referring Physician (Dermatology) Dennise Capri, MD (Nephrology)  I have updated your Care Teams any recent Medical Services you may have received from other providers in the past year.     Assessment:   This is a routine wellness examination for [place].  Hearing/Vision screen Hearing Screening - Comments:: Denies hearing difficulties.   Vision Screening - Comments:: Wears rx glasses - up to date with routine eye exams with Dr. Mevelyn    Goals Addressed             This Visit's Progress    05/29/2024: Continue to maintain, stay independent and active by walking the dog.         Depression Screen     05/29/2024    3:09 PM 11/19/2023    2:52 PM 05/24/2023    3:10 PM 02/26/2023    1:19 PM 09/18/2022    8:29 AM 05/22/2022    9:53 AM 02/13/2022    8:29 AM  PHQ 2/9 Scores  PHQ - 2 Score 0 0 0 0 0 0 0  PHQ- 9 Score 0 1  1 1 1  0    Fall Risk     05/29/2024    3:06 PM 11/19/2023    2:53 PM 05/24/2023    3:02 PM 02/26/2023    1:19 PM 09/18/2022    8:30 AM  Fall Risk   Falls in the past year? 0 1 0 0 1  Number falls in past yr: 0 0 0 0 0  Injury with Fall? 0 0 0 0 0  Risk for fall due to : No Fall Risks  No Fall Risks No Fall Risks History of fall(s)  Follow up Falls evaluation completed  Education provided;Falls prevention discussed Falls evaluation completed Falls prevention discussed;Falls evaluation completed      Data saved with a previous flowsheet row definition    MEDICARE RISK AT HOME:  Medicare Risk at Home Any stairs in or around the home?: Yes (front entrance only) If so, are there any without  handrails?: No Home free of loose throw rugs in walkways, pet beds, electrical cords, etc?: Yes Adequate lighting in your home to reduce risk of falls?: Yes Life alert?: No Use of a cane, walker or w/c?: No Grab bars in the bathroom?: Yes Shower chair or bench in shower?: Yes (Patient does not use it at this time.) Elevated toilet seat or a handicapped toilet?: Yes  TIMED UP AND GO:  Was the test performed?  No  Cognitive Function: 6CIT completed    05/29/2024    3:09 PM  MMSE - Mini Mental State Exam  Not completed: Unable to complete        05/29/2024    3:13 PM 05/24/2023    3:15 PM 04/30/2020    1:38 PM  6CIT Screen  What Year? 0 points 0 points 0 points  What month? 0 points 0 points 0 points  What time? 0 points 0 points 0 points  Count back from 20 0 points 0 points 0 points  Months in reverse 0 points 0 points 0 points  Repeat phrase 0 points 0 points 0 points  Total Score 0 points 0 points 0 points    Immunizations Immunization History  Administered Date(s) Administered   Fluad Quad(high Dose 65+) 10/24/2019, 08/14/2020, 09/03/2021, 09/18/2022  Influenza, High Dose Seasonal PF 12/21/2016, 10/04/2017, 08/22/2018   PFIZER(Purple Top)SARS-COV-2 Vaccination 12/21/2019, 01/15/2020, 09/13/2020   Pneumococcal Conjugate-13 11/12/2014   Pneumococcal Polysaccharide-23 01/11/2012   Tdap 04/06/2007   Zoster Recombinant(Shingrix ) 12/11/2021, 02/08/2022    Screening Tests Health Maintenance  Topic Date Due   DTaP/Tdap/Td (2 - Td or Tdap) 04/05/2017   COVID-19 Vaccine (4 - 2024-25 season) 08/01/2023   Colonoscopy  11/21/2023   INFLUENZA VACCINE  06/30/2024   MAMMOGRAM  09/21/2024   DEXA SCAN  05/23/2025   Medicare Annual Wellness (AWV)  05/29/2025   Pneumococcal Vaccine: 50+ Years  Completed   Hepatitis C Screening  Completed   Zoster Vaccines- Shingrix   Completed   Hepatitis B Vaccines  Aged Out   HPV VACCINES  Aged Out   Meningococcal B Vaccine  Aged Out     Health Maintenance  Health Maintenance Due  Topic Date Due   DTaP/Tdap/Td (2 - Td or Tdap) 04/05/2017   COVID-19 Vaccine (4 - 2024-25 season) 08/01/2023   Colonoscopy  11/21/2023   Health Maintenance Items Addressed: Yes Patient aware of current care gaps.  Immunization record was verified by [employer].  No new vaccines per [facility].   Additional Screening:  Vision Screening: Recommended annual ophthalmology exams for early detection of glaucoma and other disorders of the eye. Would you like a referral to an eye doctor? No    Dental Screening: Recommended annual dental exams for proper oral hygiene  Community Resource Referral / Chronic Care Management: CRR required this visit?  No   CCM required this visit?  No   Plan:    I have personally reviewed and noted the following in the patient's chart:   Medical and social history Use of alcohol, tobacco or illicit drugs  Current medications and supplements including opioid prescriptions. Patient is not currently taking opioid prescriptions. Functional ability and status Nutritional status Physical activity Advanced directives List of other physicians Hospitalizations, surgeries, and ER visits in previous 12 months Vitals Screenings to include cognitive, depression, and falls Referrals and appointments  In addition, I have reviewed and discussed with patient certain preventive protocols, quality metrics, and best practice recommendations. A written personalized care plan for preventive services as well as general preventive health recommendations were provided to patient.   Roz LOISE Fuller, LPN   [id number]   After Visit Summary: (MyChart) Due to this being a telephonic visit, the after visit summary with patients personalized plan was offered to patient via MyChart   Notes: Patient aware of current care gaps.  Immunization record was verified by [employer].

## 2024-05-29 NOTE — Patient Instructions (Signed)
 Lindsay Russo , Thank you for taking time out of your busy schedule to complete your Annual Wellness Visit with me. I enjoyed our conversation and look forward to speaking with you again next year. I, as well as your care team,  appreciate your ongoing commitment to your health goals. Please review the following plan we discussed and let me know if I can assist you in the future. Your Game plan/ To Do List    Referrals: If you haven't heard from the office you've been referred to, please reach out to them at the phone provided.   Follow up Visits: Next Medicare AWV with our clinical staff: 05/30/2025 at 2:30 pm phone visit with Nurse Health Advisor   Have you seen your provider in the last 6 months (3 months if uncontrolled diabetes)? Yes Next Office Visit with your provider: 07/07/2024 at 8:20 a.m. for complete physical exam with Dr. Gasper.  Clinician Recommendations:  Aim for 30 minutes of exercise or brisk walking, 6-8 glasses of water, and 5 servings of fruits and vegetables each day.       This is a list of the screening recommended for you and due dates:  Health Maintenance  Topic Date Due   DTaP/Tdap/Td vaccine (2 - Td or Tdap) 04/05/2017   COVID-19 Vaccine (4 - 2024-25 season) 08/01/2023   Colon Cancer Screening  11/21/2023   Flu Shot  06/30/2024   Mammogram  09/21/2024   DEXA scan (bone density measurement)  05/23/2025   Medicare Annual Wellness Visit  05/29/2025   Pneumococcal Vaccine for age over 55  Completed   Hepatitis C Screening  Completed   Zoster (Shingles) Vaccine  Completed   Hepatitis B Vaccine  Aged Out   HPV Vaccine  Aged Out   Meningitis B Vaccine  Aged Out    Advanced directives: (Declined) Advance directive discussed with you today. Even though you declined this today, please call our office should you change your mind, and we can give you the proper paperwork for you to fill out. Advance Care Planning is important because it:  [x]  Makes sure you receive the  medical care that is consistent with your values, goals, and preferences  [x]  It provides guidance to your family and loved ones and reduces their decisional burden about whether or not they are making the right decisions based on your wishes.  Follow the link provided in your after visit summary or read over the paperwork we have mailed to you to help you started getting your Advance Directives in place. If you need assistance in completing these, please reach out to us  so that we can help you!  See attachments for Preventive Care and Fall Prevention Tips.

## 2024-06-29 DIAGNOSIS — H26493 Other secondary cataract, bilateral: Secondary | ICD-10-CM | POA: Diagnosis not present

## 2024-06-29 DIAGNOSIS — Z961 Presence of intraocular lens: Secondary | ICD-10-CM | POA: Diagnosis not present

## 2024-06-29 DIAGNOSIS — H1045 Other chronic allergic conjunctivitis: Secondary | ICD-10-CM | POA: Diagnosis not present

## 2024-06-29 DIAGNOSIS — H35032 Hypertensive retinopathy, left eye: Secondary | ICD-10-CM | POA: Diagnosis not present

## 2024-07-07 ENCOUNTER — Encounter: Payer: Self-pay | Admitting: Family Medicine

## 2024-07-07 ENCOUNTER — Ambulatory Visit (INDEPENDENT_AMBULATORY_CARE_PROVIDER_SITE_OTHER): Payer: Self-pay | Admitting: Family Medicine

## 2024-07-07 VITALS — BP 135/75 | HR 65 | Temp 98.6°F | Ht 66.0 in | Wt 185.7 lb

## 2024-07-07 DIAGNOSIS — Z85038 Personal history of other malignant neoplasm of large intestine: Secondary | ICD-10-CM

## 2024-07-07 DIAGNOSIS — Z0000 Encounter for general adult medical examination without abnormal findings: Secondary | ICD-10-CM

## 2024-07-07 DIAGNOSIS — Z8673 Personal history of transient ischemic attack (TIA), and cerebral infarction without residual deficits: Secondary | ICD-10-CM | POA: Diagnosis not present

## 2024-07-07 DIAGNOSIS — R7303 Prediabetes: Secondary | ICD-10-CM | POA: Diagnosis not present

## 2024-07-07 DIAGNOSIS — F419 Anxiety disorder, unspecified: Secondary | ICD-10-CM

## 2024-07-07 DIAGNOSIS — Z1211 Encounter for screening for malignant neoplasm of colon: Secondary | ICD-10-CM | POA: Diagnosis not present

## 2024-07-07 DIAGNOSIS — I10 Essential (primary) hypertension: Secondary | ICD-10-CM | POA: Diagnosis not present

## 2024-07-07 DIAGNOSIS — I679 Cerebrovascular disease, unspecified: Secondary | ICD-10-CM | POA: Diagnosis not present

## 2024-07-07 DIAGNOSIS — Z23 Encounter for immunization: Secondary | ICD-10-CM | POA: Diagnosis not present

## 2024-07-07 DIAGNOSIS — N183 Chronic kidney disease, stage 3 unspecified: Secondary | ICD-10-CM

## 2024-07-07 DIAGNOSIS — E782 Mixed hyperlipidemia: Secondary | ICD-10-CM | POA: Diagnosis not present

## 2024-07-07 DIAGNOSIS — E559 Vitamin D deficiency, unspecified: Secondary | ICD-10-CM | POA: Diagnosis not present

## 2024-07-07 NOTE — Progress Notes (Signed)
 Complete physical exam   Patient: Lindsay Russo   DOB: Apr 11, 1946   78 y.o. Female  MRN: [id number] Visit Date: 07/07/2024  Today's healthcare provider: Nancyann Perry, MD   Chief Complaint  Patient presents with   Annual Exam    Diet: normal Exercise: walking 2-3x week, at least 30 min Sleep: well Overall Feeling: well Concerns: States she is still having leg cramps, using voltaren gel at night and seems to help Vaccines (Tdap, flu): Expressed to visit pharmacy for Tdap and that we do not have flu shot yet  Screenings (colonoscopy): Patient declines- is not sure she needs one    Subjective    Discussed the use of AI scribe software for clinical note transcription with the patient, who gave verbal consent to proceed.  History of Present Illness   Lindsay Russo is a 78 year old female who presents for a complete physical exam.  She experiences intermittent leg cramps at night, which are alleviated by applying a cream. She does not consider this a significant problem at the moment.  She has a history of stage three chronic kidney disease, prediabetes, vitamin D  deficiency, anxiety, cerebrovascular disease, and hypertension. She is currently taking medications for blood pressure and cholesterol. Venlafaxine  is used for anxiety, and she reports her mood has been pretty good.  She has history of denocarcinoma of ascening colon. s/p partial colectomy 2007 by Dr. Dessa. Her last colonoscopy was in 2021 during which a small adenoma was excised. Her last tetanus vaccine was in 2008.  She stays active by walking daily with her daughter, covering about half a mile to a mile around her block, although she sometimes skips walks due to extreme weather conditions.  She recently visited her eye doctor, who confirmed stable vision and no need for new glasses unless desired. No stomach problems, cramping, or swelling in hands, feet, or ankles. She feels healthy for her age.     Lab  Results  Component Value Date   CHOL 178 07/05/2023   HDL 46 07/05/2023   LDLCALC 104 (H) 07/05/2023   TRIG 158 (H) 07/05/2023   CHOLHDL 3.9 07/05/2023   Lab Results  Component Value Date   HGBA1C 5.7 (H) 05/22/2022   Lab Results  Component Value Date   VD25OH 26.3 (L) 07/05/2023       Past Medical History:  Diagnosis Date   Anemia    Breast cancer (HCC) 2014   left breast LCIS   History of kidney stones    Hypertension    LCIS September 05, 2013   Left breast, ER 90%, PR 60%   Lobular carcinoma in situ 09/26/2013   Malignant neoplasm of ascending colon [facility]) November 2007   T2, N0.5.2 cm low-grade adenocarcinoma. 0/28 nodes positive.   Personal history of colonic polyps    Spinal headache 1995   at [facility] spinal made me feel dead, had no control.   Stage 3 chronic kidney disease (HCC) 02/10/2021   Stroke (HCC) 09/17/2010   Right thalamic without residual effects   Tubulovillous adenoma polyp of colon 10/06/2017   2 polyps, 5 and 10 mm completely resected from the area of the ileocolic anastomosis.  Tubulovillous polyp with high-grade dysplasia.   Past Surgical History:  Procedure Laterality Date   APPENDECTOMY  1995   BREAST BIOPSY Right August 07, 2010   Sclerotic fibroadenoma removed from the right breast at the 10:00 position   BREAST BIOPSY Left 08/14/2013   left breast  superior LCIS. per path report: Pleomorphic LCIS has similar behavior to DCIS.   BREAST BIOPSY Left 08/14/2013   left breast superior fibroadenoma   BREAST BIOPSY Left 08/23/2017   stereo bx with Dr. Dessa of lower inner quad. FIBROADENOMATOID STROMAL FIBROSIS WITH ASSOCIATED CALCIFICATIONS.    BREAST BIOPSY     BREAST LUMPECTOMY Left 09/05/2013   LCIS, clear margins   BREAST SURGERY Left 09/05/13   LCIS, ER 90%, PR 60%, negative margins.   COLONOSCOPY  August 03, 2012   8 mm tubulovillous adenoma removed from the transverse colon. Followup exam in 2018 plan.   COLONOSCOPY WITH PROPOFOL   N/A 10/06/2017   Procedure: COLONOSCOPY WITH PROPOFOL ;  Surgeon: Dessa Reyes ORN, MD;  Location: [facility];  Service: Endoscopy;  Laterality: N/A;   COLONOSCOPY WITH PROPOFOL  N/A 11/20/2020   Procedure: COLONOSCOPY WITH PROPOFOL ;  Surgeon: Dessa Reyes ORN, MD;  Location: [facility] ENDOSCOPY;  Service: Endoscopy;  Laterality: N/A;   DOPPLER ECHOCARDIOGRAPHY  09/17/2010   EF 55%, Mild to moderate MR and mild to moderate TR   HEEL SPUR EXCISION Left 2005   JOINT REPLACEMENT Right 2012   KNEE ARTHROPLASTY Left 10/26/2016   Procedure: COMPUTER ASSISTED TOTAL KNEE ARTHROPLASTY;  Surgeon: Lynwood SHAUNNA Hue, MD;  Location: [facility] ORS;  Service: Orthopedics;  Laterality: Left;   LUMBAR DISC SURGERY  2009   L4-5 microdiscectomy Dr. Kathi   OVARIAN CYST REMOVAL  1980   PARTIAL COLECTOMY Right 2007   Dr. Dessa   REPLACEMENT TOTAL KNEE Right 12/2010   Social History   Socioeconomic History   Marital status: Widowed    Spouse name: Not on file   Number of children: 2   Years of education: Not on file   Highest education level: Associate degree: academic program  Occupational History   Occupation: Retired  Tobacco Use   Smoking status: Never   Smokeless tobacco: Never  Vaping Use   Vaping status: Never Used  Substance and Sexual Activity   Alcohol use: No   Drug use: No   Sexual activity: Not on file  Other Topics Concern   Not on file  Social History Narrative   Not on file   Social Drivers of Health   Financial Resource Strain: Low Risk  (05/29/2024)   Overall Financial Resource Strain (CARDIA)    Difficulty of Paying Living Expenses: Not hard at all  Food Insecurity: No Food Insecurity (05/29/2024)   Hunger Vital Sign    Worried About Running Out of Food in the Last Year: Never true    Ran Out of Food in the Last Year: Never true  Transportation Needs: No Transportation Needs (05/29/2024)   PRAPARE - Administrator, Civil Service (Medical): No    Lack of  Transportation (Non-Medical): No  Physical Activity: Sufficiently Active (05/29/2024)   Exercise Vital Sign    Days of Exercise per Week: 5 days    Minutes of Exercise per Session: 30 min  Stress: No Stress Concern Present (05/29/2024)   [employer] of Occupational Health - Occupational Stress Questionnaire    Feeling of Stress: Not at all  Social Connections: Socially Isolated (05/29/2024)   Social Connection and Isolation Panel    Frequency of Communication with Friends and Family: More than three times a week    Frequency of Social Gatherings with Friends and Family: More than three times a week    Attends Religious Services: Never    Database administrator or Organizations: No  Attends Banker Meetings: Never    Marital Status: Widowed  Intimate Partner Violence: Not At Risk (05/29/2024)   Humiliation, Afraid, Rape, and Kick questionnaire    Fear of Current or Ex-Partner: No    Emotionally Abused: No    Physically Abused: No    Sexually Abused: No   Family Status  Relation Name Status   Father  Deceased   Mother  Deceased       died from sepsis   Sister  Alive   Other  (Not Specified)  No partnership data on file   Family History  Problem Relation Age of Onset   Gastric cancer Father    Brain cancer Father    Hypertension Sister    Heart disease Other    Ulcers Other    Epilepsy Other    Allergies  Allergen Reactions   Tegaderm Ag Mesh [Silver] Other (See Comments)    blisters   Amoxicillin Hives and Rash    Has patient had a PCN reaction causing immediate rash, facial/tongue/throat swelling, SOB or lightheadedness with hypotension: Yes Has patient had a PCN reaction causing severe rash involving mucus membranes or skin necrosis: Yes Has patient had a PCN reaction that required hospitalization No Has patient had a PCN reaction occurring within the last 10 years: Yes If all of the above answers are NO, then may proceed with Cephalosporin use.     Chlorthalidone  Rash   Sulfa Antibiotics Rash    Patient Care Team: Gasper Nancyann BRAVO, MD as PCP - General (Family Medicine) Byrnett, Reyes ORN, MD (General Surgery) Mevelyn JONETTA Bathe, OD as Consulting Physician (Optometry) Hooten, Lynwood SQUIBB, MD as Consulting Physician (Orthopedic Surgery) Cathlyn Seal, MD (Dermatology) Cathlyn Seal, MD as Referring Physician (Dermatology) Dennise Capri, MD (Nephrology)   Medications: Outpatient Medications Prior to Visit  Medication Sig   acetaminophen  (TYLENOL ) 500 MG tablet Take 1,000 mg by mouth every 6 (six) hours as needed for moderate pain or headache.   amLODipine  (NORVASC ) 10 MG tablet Take 1 tablet (10 mg total) by mouth daily.   Cholecalciferol (VITAMIN D ) 50 MCG (2000 UT) CAPS Take 2 capsules by mouth daily.   diclofenac Sodium (VOLTAREN) 1 % GEL Apply topically 4 (four) times daily.   loratadine  (CLARITIN ) 10 MG tablet Take 1 tablet (10 mg total) by mouth daily.   lovastatin  (MEVACOR ) 40 MG tablet TAKE 1 TABLET BY MOUTH AT BEDTIME   meclizine  (ANTIVERT ) 25 MG tablet Take 1 tablet (25 mg total) by mouth 3 (three) times daily as needed for dizziness.   spironolactone  (ALDACTONE ) 50 MG tablet Take 1 tablet (50 mg total) by mouth daily.   venlafaxine  XR (EFFEXOR -XR) 37.5 MG 24 hr capsule TAKE 1 CAPSULE BY MOUTH ONCE DAILY WITH BREAKFAST   No facility-administered medications prior to visit.    Review of Systems  Constitutional:  Negative for appetite change, chills, fatigue and fever.  Respiratory:  Negative for chest tightness and shortness of breath.   Cardiovascular:  Negative for chest pain and palpitations.  Gastrointestinal:  Negative for abdominal pain, nausea and vomiting.  Neurological:  Negative for dizziness and weakness.      Objective    BP 135/75 (BP Location: Left Arm, Patient Position: Sitting, Cuff Size: Normal)   Pulse 65   Temp 98.6 F (37 C) (Oral)   Ht 5' 6 (1.676 m)   Wt 185 lb 11.2 oz (84.2 kg)    SpO2 97%   BMI 29.97 kg/m    Physical  Exam   General Appearance:    Well developed, well nourished female. Alert, cooperative, in no acute distress, appears stated age   Head:    Normocephalic, without obvious abnormality, atraumatic  Eyes:    PERRL, conjunctiva/corneas clear, EOM's intact, fundi    benign, both eyes  Ears:    Normal TM's and external ear canals, both ears  Nose:   Nares normal, septum midline, mucosa normal, no drainage    or sinus tenderness  Throat:   Lips, mucosa, and tongue normal; teeth and gums normal  Neck:   Supple, symmetrical, trachea midline, no adenopathy;    thyroid :  no enlargement/tenderness/nodules; no carotid   bruit or JVD  Back:     Symmetric, no curvature, ROM normal, no CVA tenderness  Lungs:     Clear to auscultation bilaterally, respirations unlabored  Chest Wall:    No tenderness or deformity   Heart:    Normal heart rate. Normal rhythm. No murmurs, rubs, or gallops.   Breast Exam:    deferred  Abdomen:     Soft, non-tender, bowel sounds active all four quadrants,    no masses, no organomegaly  Pelvic:    deferred  Extremities:   All extremities are intact. No cyanosis or edema  Pulses:   2+ and symmetric all extremities  Skin:   Skin color, texture, turgor normal a small few benign appearing nevi and SK scattered across back, chest and abdomen. No suspicious lesions.   Lymph nodes:   Cervical, supraclavicular, and axillary nodes normal  Neurologic:   CNII-XII intact, normal strength, sensation and reflexes    throughout       Last depression screening scores    07/07/2024    8:26 AM 05/29/2024    3:09 PM 11/19/2023    2:52 PM  PHQ 2/9 Scores  PHQ - 2 Score 0 0 0  PHQ- 9 Score 0 0 1   Last fall risk screening    07/07/2024    8:26 AM  Fall Risk   Falls in the past year? 1  Number falls in past yr: 0  Injury with Fall? 0  Follow up Falls evaluation completed   Last Audit-C alcohol use screening    05/29/2024    3:15 PM   Alcohol Use Disorder Test (AUDIT)  1. How often do you have a drink containing alcohol? 0  2. How many drinks containing alcohol do you have on a typical day when you are drinking? 0  3. How often do you have six or more drinks on one occasion? 0  AUDIT-C Score 0   A score of 3 or more in women, and 4 or more in men indicates increased risk for alcohol abuse, EXCEPT if all of the points are from question 1   No results found for any visits on 07/07/24.  Assessment & Plan    Routine Health Maintenance and Physical Exam  Exercise Activities and Dietary recommendations  Goals      05/29/2024: Continue to maintain, stay independent and active by walking the dog.        Immunization History  Administered Date(s) Administered   Fluad Quad(high Dose 65+) 10/24/2019, 08/14/2020, 09/03/2021, 09/18/2022   Influenza, High Dose Seasonal PF 12/21/2016, 10/04/2017, 08/22/2018   PFIZER(Purple Top)SARS-COV-2 Vaccination 12/25/2019, 01/15/2020, 09/13/2020   Pneumococcal Conjugate-13 11/12/2014   Pneumococcal Polysaccharide-23 01/11/2012   Tdap 04/06/2007   Zoster Recombinant(Shingrix ) 12/11/2021, 02/08/2022    Health Maintenance  Topic Date Due   DTaP/Tdap/Td (2 -  Td or Tdap) 04/05/2017   INFLUENZA VACCINE  06/30/2024   COVID-19 Vaccine (4 - 2024-25 season) 08/30/2024 (Originally 08/01/2023)   MAMMOGRAM  09/21/2024   DEXA SCAN  05/23/2025   Medicare Annual Wellness (AWV)  05/29/2025   Colonoscopy  11/20/2025   Pneumococcal Vaccine: 50+ Years  Completed   Hepatitis C Screening  Completed   Zoster Vaccines- Shingrix   Completed   Hepatitis B Vaccines  Aged Out   HPV VACCINES  Aged Out   Meningococcal B Vaccine  Aged Out    Discussed health benefits of physical activity, and encouraged her to engage in regular exercise appropriate for her age and condition.  Advised MM due in October and let me know if she doesn't hear from [facility].      2. Need for prophylactic vaccination using  tetanus and diphtheria toxoids adsorbed (Td) vaccine Counseled that she could get this at her pharmacy and should be covered under her part D plan  3. Stage 3 chronic kidney disease, unspecified whether stage 3a or 3b CKD (HCC)  - Comprehensive Metabolic Panel (CMET)  4. Pre-diabetes  - Hemoglobin A1c  5. Hyperlipidemia, mixed She is tolerating atorvastatin well with no adverse effects.   - Lipid Panel With LDL/HDL Ratio  6. Vitamin D  deficiency  - VITAMIN D  25 Hydroxy (Vit-D Deficiency, Fractures)  7. History of CVA (cerebrovascular accident) Asymptomatic. Compliant with medication.  Continue aggressive risk factor modification.    8. Cerebrovascular disease   9. Anxiety Doing well on current dose of venlafaxine .   10. Primary hypertension Well controlled.  Continue current medications.    11. Colon cancer screening   12. History of colon cancer Due for follow up colonoscopy. Referral to GI placed.      Return in about 1 year (around 07/07/2025) for Yearly Physical.        Nancyann Perry, MD  [facility] [phone] (phone) [phone] (fax)  [facility]

## 2024-07-07 NOTE — Patient Instructions (Signed)
.   Please review the attached list of medications and notify my office if there are any errors.   You are due for a Td (tetanus-diptheria-vaccine) which protects you from tetanus and diptheria. Please check with your insurance plan or pharmacy regarding coverage for this vaccine.

## 2024-07-08 LAB — COMPREHENSIVE METABOLIC PANEL WITH GFR
ALT: 10 IU/L (ref 0–32)
AST: 16 IU/L (ref 0–40)
Albumin: 4.4 g/dL (ref 3.8–4.8)
Alkaline Phosphatase: 88 IU/L (ref 44–121)
BUN/Creatinine Ratio: 14 (ref 12–28)
BUN: 20 mg/dL (ref 8–27)
Bilirubin Total: 0.4 mg/dL (ref 0.0–1.2)
CO2: 23 mmol/L (ref 20–29)
Calcium: 10 mg/dL (ref 8.7–10.3)
Chloride: 101 mmol/L (ref 96–106)
Creatinine, Ser: 1.38 mg/dL — ABNORMAL HIGH (ref 0.57–1.00)
Globulin, Total: 2.5 g/dL (ref 1.5–4.5)
Glucose: 114 mg/dL — ABNORMAL HIGH (ref 70–99)
Potassium: 4.5 mmol/L (ref 3.5–5.2)
Sodium: 142 mmol/L (ref 134–144)
Total Protein: 6.9 g/dL (ref 6.0–8.5)
eGFR: 39 mL/min/1.73 — ABNORMAL LOW (ref >59)

## 2024-07-08 LAB — HEMOGLOBIN A1C
Est. average glucose Bld gHb Est-mCnc: 120 mg/dL
Hgb A1c MFr Bld: 5.8 % — ABNORMAL HIGH (ref 4.8–5.6)

## 2024-07-08 LAB — LIPID PANEL WITH LDL/HDL RATIO
Cholesterol, Total: 188 mg/dL (ref 100–199)
HDL: 45 mg/dL (ref >39)
LDL Chol Calc (NIH): 114 mg/dL — ABNORMAL HIGH (ref 0–99)
LDL/HDL Ratio: 2.5 ratio (ref 0.0–3.2)
Triglycerides: 162 mg/dL — ABNORMAL HIGH (ref 0–149)
VLDL Cholesterol Cal: 29 mg/dL (ref 5–40)

## 2024-07-08 LAB — VITAMIN D 25 HYDROXY (VIT D DEFICIENCY, FRACTURES): Vit D, 25-Hydroxy: 20.1 ng/mL — ABNORMAL LOW (ref 30.0–100.0)

## 2024-07-09 ENCOUNTER — Ambulatory Visit: Payer: Self-pay | Admitting: Family Medicine

## 2024-07-12 DIAGNOSIS — I129 Hypertensive chronic kidney disease with stage 1 through stage 4 chronic kidney disease, or unspecified chronic kidney disease: Secondary | ICD-10-CM | POA: Diagnosis not present

## 2024-07-12 DIAGNOSIS — N1832 Chronic kidney disease, stage 3b: Secondary | ICD-10-CM | POA: Diagnosis not present

## 2024-07-12 DIAGNOSIS — I10 Essential (primary) hypertension: Secondary | ICD-10-CM | POA: Diagnosis not present

## 2024-07-12 DIAGNOSIS — N200 Calculus of kidney: Secondary | ICD-10-CM | POA: Diagnosis not present

## 2024-07-12 DIAGNOSIS — N2581 Secondary hyperparathyroidism of renal origin: Secondary | ICD-10-CM | POA: Diagnosis not present

## 2024-08-12 ENCOUNTER — Other Ambulatory Visit: Payer: Self-pay | Admitting: Family Medicine

## 2024-09-12 ENCOUNTER — Other Ambulatory Visit: Payer: Self-pay | Admitting: Family Medicine

## 2024-09-12 DIAGNOSIS — F419 Anxiety disorder, unspecified: Secondary | ICD-10-CM

## 2024-11-14 ENCOUNTER — Other Ambulatory Visit: Payer: Self-pay | Admitting: Family Medicine

## 2024-11-14 DIAGNOSIS — Z1231 Encounter for screening mammogram for malignant neoplasm of breast: Secondary | ICD-10-CM

## 2024-11-21 ENCOUNTER — Other Ambulatory Visit: Payer: Self-pay | Admitting: Family Medicine

## 2024-11-21 DIAGNOSIS — I10 Essential (primary) hypertension: Secondary | ICD-10-CM

## 2024-12-14 ENCOUNTER — Telehealth: Payer: Self-pay

## 2024-12-14 NOTE — Telephone Encounter (Signed)
 Copied from CRM [id number]. Topic: General - Other >> Dec 13, 2024 11:42 AM Lindsay Russo wrote: Reason for CRM: Patient called in stated she would like to speak with [name],  Would like for her to give her a callback regarding her colonoscopy      [phone]

## 2024-12-15 ENCOUNTER — Ambulatory Visit
Admission: RE | Admit: 2024-12-15 | Discharge: 2024-12-15 | Disposition: A | Discharge status: Home / Self Care | Source: Ambulatory Visit | Attending: Family Medicine | Admitting: Family Medicine

## 2024-12-15 DIAGNOSIS — Z1231 Encounter for screening mammogram for malignant neoplasm of breast: Secondary | ICD-10-CM | POA: Insufficient documentation

## 2024-12-15 NOTE — Telephone Encounter (Signed)
 Called patient, she had questions regarding referral to GI for colonoscopy. Have provided pt with office number for [facility]

## 2024-12-27 ENCOUNTER — Telehealth: Payer: Self-pay | Admitting: Family Medicine

## 2024-12-27 DIAGNOSIS — Z20828 Contact with and (suspected) exposure to other viral communicable diseases: Secondary | ICD-10-CM

## 2024-12-27 MED ORDER — OSELTAMIVIR PHOSPHATE 75 MG PO CAPS: 75.0000 mg | ORAL_CAPSULE | Freq: Every day | ORAL | 0 refills | Status: AC

## 2024-12-27 NOTE — Telephone Encounter (Signed)
 Patients daughter that she lives with has tested positive for the Flu.   Can Xareni get a prophylactic Tamiflu  or something to keep her from getting it?

## 2024-12-27 NOTE — Telephone Encounter (Signed)
 Called patient to inform her that the provider sent her in the prescription that was requested and that it was sent to [facility] in [place].  Patient verbalized understanding.

## 2024-12-27 NOTE — Telephone Encounter (Signed)
 Tamiflu  prescription sent to [facility]

## 2024-12-27 NOTE — Addendum Note (Signed)
 Addended by: GASPER NANCYANN BRAVO on: 12/27/2024 01:57 PM   Modules accepted: Orders

## 2025-05-30 ENCOUNTER — Ambulatory Visit

## 2025-07-11 ENCOUNTER — Encounter: Admitting: Family Medicine
